# Patient Record
Sex: Female | Born: 1985
Health system: Southern US, Community
[De-identification: ages and names within clinical notes are randomized; demographics above are authoritative.]

## PROBLEM LIST (undated history)

## (undated) DIAGNOSIS — T8859XA Other complications of anesthesia, initial encounter: Secondary | ICD-10-CM

## (undated) DIAGNOSIS — T4145XA Adverse effect of unspecified anesthetic, initial encounter: Secondary | ICD-10-CM

## (undated) DIAGNOSIS — R112 Nausea with vomiting, unspecified: Secondary | ICD-10-CM

## (undated) DIAGNOSIS — F419 Anxiety disorder, unspecified: Secondary | ICD-10-CM

## (undated) DIAGNOSIS — R1084 Generalized abdominal pain: Secondary | ICD-10-CM

## (undated) DIAGNOSIS — R05 Cough: Secondary | ICD-10-CM

## (undated) DIAGNOSIS — M5481 Occipital neuralgia: Secondary | ICD-10-CM

## (undated) DIAGNOSIS — E282 Polycystic ovarian syndrome: Secondary | ICD-10-CM

## (undated) DIAGNOSIS — Z9889 Other specified postprocedural states: Secondary | ICD-10-CM

## (undated) DIAGNOSIS — I10 Essential (primary) hypertension: Secondary | ICD-10-CM

## (undated) DIAGNOSIS — K625 Hemorrhage of anus and rectum: Secondary | ICD-10-CM

## (undated) DIAGNOSIS — N200 Calculus of kidney: Secondary | ICD-10-CM

## (undated) DIAGNOSIS — K760 Fatty (change of) liver, not elsewhere classified: Secondary | ICD-10-CM

## (undated) DIAGNOSIS — R198 Other specified symptoms and signs involving the digestive system and abdomen: Secondary | ICD-10-CM

## (undated) HISTORY — DX: Hemorrhage of anus and rectum: K62.5

## (undated) HISTORY — PX: ESOPHAGOGASTRODUODENOSCOPY ENDOSCOPY: SHX5814

## (undated) HISTORY — PX: EYE SURGERY: SHX253

## (undated) HISTORY — DX: Calculus of kidney: N20.0

## (undated) HISTORY — DX: Fatty (change of) liver, not elsewhere classified: K76.0

## (undated) HISTORY — DX: Generalized abdominal pain: R10.84

## (undated) HISTORY — DX: Cough: R05

## (undated) HISTORY — PX: LASIK: SHX215

## (undated) HISTORY — DX: Other specified symptoms and signs involving the digestive system and abdomen: R19.8

## (undated) HISTORY — PX: CHOLECYSTECTOMY: SHX55

---
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
## 2004-05-10 ENCOUNTER — Other Ambulatory Visit: Admission: RE | Admit: 2004-05-10 | Discharge: 2004-05-10 | Payer: Self-pay | Admitting: Gynecology

## 2009-06-14 HISTORY — PX: LAPAROSCOPIC CHOLECYSTECTOMY: SUR755

## 2009-07-18 ENCOUNTER — Emergency Department (HOSPITAL_COMMUNITY): Admission: EM | Admit: 2009-07-18 | Discharge: 2009-07-18 | Payer: Self-pay | Admitting: Emergency Medicine

## 2009-07-19 ENCOUNTER — Observation Stay (HOSPITAL_COMMUNITY): Admission: EM | Admit: 2009-07-19 | Discharge: 2009-07-20 | Payer: Self-pay | Admitting: Emergency Medicine

## 2010-06-14 ENCOUNTER — Emergency Department: Payer: Self-pay | Admitting: Emergency Medicine

## 2010-11-14 HISTORY — PX: OTHER SURGICAL HISTORY: SHX169

## 2011-01-10 ENCOUNTER — Other Ambulatory Visit: Payer: Self-pay | Admitting: Allergy

## 2011-01-10 ENCOUNTER — Ambulatory Visit
Admission: RE | Admit: 2011-01-10 | Discharge: 2011-01-10 | Disposition: A | Discharge status: Home / Self Care | Payer: BC Managed Care – PPO | Source: Ambulatory Visit | Attending: Allergy | Admitting: Allergy

## 2011-02-18 LAB — DIFFERENTIAL
Basophils Absolute: 0 10*3/uL (ref 0.0–0.1)
Basophils Absolute: 0 10*3/uL (ref 0.0–0.1)
Basophils Relative: 0 % (ref 0–1)
Eosinophils Absolute: 0.2 10*3/uL (ref 0.0–0.7)
Eosinophils Absolute: 0.2 10*3/uL (ref 0.0–0.7)
Eosinophils Relative: 1 % (ref 0–5)
Eosinophils Relative: 3 % (ref 0–5)
Lymphocytes Relative: 24 % (ref 12–46)
Neutrophils Relative %: 80 % — ABNORMAL HIGH (ref 43–77)

## 2011-02-18 LAB — COMPREHENSIVE METABOLIC PANEL
ALT: 33 U/L (ref 0–35)
ALT: 35 U/L (ref 0–35)
AST: 21 U/L (ref 0–37)
AST: 27 U/L (ref 0–37)
Albumin: 3.5 g/dL (ref 3.5–5.2)
BUN: 8 mg/dL (ref 6–23)
CO2: 24 mEq/L (ref 19–32)
Calcium: 8.3 mg/dL — ABNORMAL LOW (ref 8.4–10.5)
Calcium: 8.9 mg/dL (ref 8.4–10.5)
Chloride: 104 mEq/L (ref 96–112)
Chloride: 105 mEq/L (ref 96–112)
Creatinine, Ser: 0.61 mg/dL (ref 0.4–1.2)
Creatinine, Ser: 0.72 mg/dL (ref 0.4–1.2)
GFR calc Af Amer: >60 mL/min (ref >60)
GFR calc Af Amer: >60 mL/min (ref >60)
GFR calc non Af Amer: >60 mL/min (ref >60)
Glucose, Bld: 115 mg/dL — ABNORMAL HIGH (ref 70–99)
Sodium: 142 mEq/L (ref 135–145)
Total Bilirubin: 0.5 mg/dL (ref 0.3–1.2)
Total Bilirubin: 0.9 mg/dL (ref 0.3–1.2)
Total Protein: 6.4 g/dL (ref 6.0–8.3)
Total Protein: 7.4 g/dL (ref 6.0–8.3)

## 2011-02-18 LAB — URINALYSIS, ROUTINE W REFLEX MICROSCOPIC
Ketones, ur: NEGATIVE mg/dL
Nitrite: NEGATIVE
Protein, ur: NEGATIVE mg/dL

## 2011-02-18 LAB — CBC
HCT: 37.6 % (ref 36.0–46.0)
MCHC: 33.5 g/dL (ref 30.0–36.0)
MCV: 79.7 fL (ref 78.0–100.0)
MCV: 79.8 fL (ref 78.0–100.0)
Platelets: 368 10*3/uL (ref 150–400)
RBC: 4.14 MIL/uL (ref 3.87–5.11)
RDW: 15.2 % (ref 11.5–15.5)
RDW: 15.3 % (ref 11.5–15.5)
WBC: 7.4 10*3/uL (ref 4.0–10.5)

## 2011-02-18 LAB — LIPASE, BLOOD: Lipase: 23 U/L (ref 11–59)

## 2011-02-18 LAB — POCT I-STAT, CHEM 8
HCT: 40 % (ref 36.0–46.0)
Hemoglobin: 13.6 g/dL (ref 12.0–15.0)
Sodium: 140 mEq/L (ref 135–145)
TCO2: 23 mmol/L (ref 0–100)

## 2011-02-18 LAB — AMYLASE: Amylase: 90 U/L (ref 27–131)

## 2011-02-18 LAB — POCT PREGNANCY, URINE: Preg Test, Ur: NEGATIVE

## 2011-11-15 HISTORY — PX: DILATION AND CURETTAGE OF UTERUS: SHX78

## 2012-01-14 LAB — URINALYSIS, COMPLETE
Bilirubin,UR: NEGATIVE
Blood: NEGATIVE
Glucose,UR: NEGATIVE mg/dL (ref 0–75)
Ph: 5 (ref 4.5–8.0)
Specific Gravity: 1.02 (ref 1.003–1.030)
Squamous Epithelial: 1

## 2012-01-14 LAB — CBC
HGB: 8.1 g/dL — ABNORMAL LOW (ref 12.0–16.0)
MCHC: 30.8 g/dL — ABNORMAL LOW (ref 32.0–36.0)
RDW: 19.5 % — ABNORMAL HIGH (ref 11.5–14.5)
WBC: 10.5 10*3/uL (ref 3.6–11.0)

## 2012-01-14 LAB — BASIC METABOLIC PANEL
BUN: 9 mg/dL (ref 7–18)
Chloride: 109 mmol/L — ABNORMAL HIGH (ref 98–107)
Co2: 21 mmol/L (ref 21–32)
Creatinine: 0.67 mg/dL (ref 0.60–1.30)
Potassium: 3.3 mmol/L — ABNORMAL LOW (ref 3.5–5.1)
Sodium: 147 mmol/L — ABNORMAL HIGH (ref 136–145)

## 2012-01-14 LAB — TROPONIN I: Troponin-I: <0.02 ng/mL

## 2012-01-15 ENCOUNTER — Observation Stay: Payer: Self-pay | Admitting: Internal Medicine

## 2012-01-15 LAB — TROPONIN I
Troponin-I: <0.02 ng/mL
Troponin-I: <0.02 ng/mL

## 2012-01-15 LAB — IRON AND TIBC
Iron Bind.Cap.(Total): 518 ug/dL — ABNORMAL HIGH (ref 250–450)
Iron: 13 ug/dL — ABNORMAL LOW (ref 50–170)
Unbound Iron-Bind.Cap.: 505 ug/dL

## 2012-01-15 LAB — FERRITIN: Ferritin (ARMC): 2 ng/mL — ABNORMAL LOW (ref 8–388)

## 2012-01-15 LAB — HCG, QUANTITATIVE, PREGNANCY: Beta Hcg, Quant.: 1560 m[IU]/mL — ABNORMAL HIGH

## 2012-01-15 LAB — RETICULOCYTES: Reticulocyte: 2.9 % — ABNORMAL HIGH (ref 0.5–1.5)

## 2012-01-16 LAB — MAGNESIUM: Magnesium: 1.6 mg/dL — ABNORMAL LOW

## 2012-01-16 LAB — CBC WITH DIFFERENTIAL/PLATELET
Basophil #: 0.1 10*3/uL (ref 0.0–0.1)
Basophil %: 0.8 %
Eosinophil #: 0.3 10*3/uL (ref 0.0–0.7)
Lymphocyte %: 27.8 %
MCHC: 30.8 g/dL — ABNORMAL LOW (ref 32.0–36.0)
MCV: 62 fL — ABNORMAL LOW (ref 80–100)
Neutrophil %: 59.4 %
Platelet: 385 10*3/uL (ref 150–440)
RBC: 3.84 10*6/uL (ref 3.80–5.20)
RDW: 18.9 % — ABNORMAL HIGH (ref 11.5–14.5)
WBC: 7.8 10*3/uL (ref 3.6–11.0)

## 2012-01-16 LAB — COMPREHENSIVE METABOLIC PANEL
Albumin: 3.2 g/dL — ABNORMAL LOW (ref 3.4–5.0)
Alkaline Phosphatase: 68 U/L (ref 50–136)
Anion Gap: 14 (ref 7–16)
Bilirubin,Total: 0.3 mg/dL (ref 0.2–1.0)
Co2: 22 mmol/L (ref 21–32)
Creatinine: 0.6 mg/dL (ref 0.60–1.30)
EGFR (Non-African Amer.): >60
Glucose: 83 mg/dL (ref 65–99)
Osmolality: 281 (ref 275–301)
SGPT (ALT): 24 U/L
Sodium: 143 mmol/L (ref 136–145)

## 2012-01-16 LAB — URINE CULTURE

## 2012-01-16 LAB — HCG, QUANTITATIVE, PREGNANCY: Beta Hcg, Quant.: 2668 m[IU]/mL — ABNORMAL HIGH

## 2012-01-16 LAB — HEMOGLOBIN A1C: Hemoglobin A1C: 5.6 % (ref 4.2–6.3)

## 2012-02-24 ENCOUNTER — Ambulatory Visit: Payer: Self-pay | Admitting: Obstetrics and Gynecology

## 2012-02-24 LAB — CBC
HCT: 33.8 % — ABNORMAL LOW (ref 35.0–47.0)
MCH: 21.8 pg — ABNORMAL LOW (ref 26.0–34.0)
MCHC: 31 g/dL — ABNORMAL LOW (ref 32.0–36.0)
Platelet: 314 10*3/uL (ref 150–440)
RBC: 4.8 10*6/uL (ref 3.80–5.20)
WBC: 6.7 10*3/uL (ref 3.6–11.0)

## 2012-02-28 LAB — PATHOLOGY REPORT

## 2012-11-14 HISTORY — PX: STENT PLACE LEFT URETER (ARMC HX): HXRAD1254

## 2013-11-11 ENCOUNTER — Ambulatory Visit: Payer: Self-pay | Admitting: Neurology

## 2013-11-13 ENCOUNTER — Emergency Department: Payer: Self-pay | Admitting: Emergency Medicine

## 2013-11-13 LAB — CBC WITH DIFFERENTIAL/PLATELET
Basophil #: 0.1 10*3/uL (ref 0.0–0.1)
Eosinophil #: 0.2 10*3/uL (ref 0.0–0.7)
Eosinophil %: 1.4 %
Lymphocyte %: 28.2 %
MCH: 27.3 pg (ref 26.0–34.0)
MCV: 81 fL (ref 80–100)
Monocyte #: 0.8 x10 3/mm (ref 0.2–0.9)
Monocyte %: 7.5 %
RBC: 4.53 10*6/uL (ref 3.80–5.20)
RDW: 13.5 % (ref 11.5–14.5)
WBC: 11.1 10*3/uL — ABNORMAL HIGH (ref 3.6–11.0)

## 2013-11-13 LAB — COMPREHENSIVE METABOLIC PANEL
Albumin: 3.7 g/dL (ref 3.4–5.0)
Anion Gap: 6 — ABNORMAL LOW (ref 7–16)
BUN: 9 mg/dL (ref 7–18)
Bilirubin,Total: 0.4 mg/dL (ref 0.2–1.0)
Calcium, Total: 9 mg/dL (ref 8.5–10.1)
Co2: 26 mmol/L (ref 21–32)
Glucose: 85 mg/dL (ref 65–99)
SGPT (ALT): 22 U/L (ref 12–78)
Sodium: 134 mmol/L — ABNORMAL LOW (ref 136–145)
Total Protein: 8.5 g/dL — ABNORMAL HIGH (ref 6.4–8.2)

## 2013-11-13 LAB — URINALYSIS, COMPLETE
Ketone: NEGATIVE
Leukocyte Esterase: NEGATIVE
Protein: NEGATIVE
RBC,UR: 110 /HPF (ref 0–5)
Specific Gravity: 1.017 (ref 1.003–1.030)
WBC UR: 3 /HPF (ref 0–5)

## 2013-11-13 LAB — LIPASE, BLOOD: Lipase: 150 U/L (ref 73–393)

## 2013-11-14 HISTORY — PX: HYSTEROSCOPY: SHX211

## 2014-05-07 ENCOUNTER — Emergency Department: Payer: Self-pay | Admitting: Emergency Medicine

## 2014-05-07 LAB — COMPREHENSIVE METABOLIC PANEL
ALBUMIN: 3.6 g/dL (ref 3.4–5.0)
Alkaline Phosphatase: 69 U/L
Anion Gap: 7 (ref 7–16)
BUN: 10 mg/dL (ref 7–18)
Bilirubin,Total: 0.2 mg/dL (ref 0.2–1.0)
Calcium, Total: 9.2 mg/dL (ref 8.5–10.1)
Chloride: 105 mmol/L (ref 98–107)
Co2: 25 mmol/L (ref 21–32)
Creatinine: 0.77 mg/dL (ref 0.60–1.30)
EGFR (African American): >60
EGFR (Non-African Amer.): >60
Glucose: 98 mg/dL (ref 65–99)
Osmolality: 273 (ref 275–301)
Potassium: 4 mmol/L (ref 3.5–5.1)
SGOT(AST): 43 U/L — ABNORMAL HIGH (ref 15–37)
SGPT (ALT): 34 U/L (ref 12–78)
Sodium: 137 mmol/L (ref 136–145)
TOTAL PROTEIN: 8.5 g/dL — AB (ref 6.4–8.2)

## 2014-05-07 LAB — CBC
HCT: 39.8 % (ref 35.0–47.0)
HGB: 13.6 g/dL (ref 12.0–16.0)
MCH: 28.7 pg (ref 26.0–34.0)
MCHC: 34.2 g/dL (ref 32.0–36.0)
MCV: 84 fL (ref 80–100)
Platelet: 368 10*3/uL (ref 150–440)
RBC: 4.75 10*6/uL (ref 3.80–5.20)
RDW: 13.7 % (ref 11.5–14.5)
WBC: 10.4 10*3/uL (ref 3.6–11.0)

## 2014-05-08 LAB — URINALYSIS, COMPLETE
BILIRUBIN, UR: NEGATIVE
Glucose,UR: NEGATIVE mg/dL (ref 0–75)
KETONE: NEGATIVE
Leukocyte Esterase: NEGATIVE
NITRITE: NEGATIVE
Ph: 5 (ref 4.5–8.0)
Protein: NEGATIVE
Specific Gravity: 1.019 (ref 1.003–1.030)
Squamous Epithelial: <1

## 2014-06-29 ENCOUNTER — Emergency Department: Payer: Self-pay | Admitting: Emergency Medicine

## 2014-06-29 LAB — BASIC METABOLIC PANEL
ANION GAP: 11 (ref 7–16)
BUN: 9 mg/dL (ref 7–18)
CALCIUM: 8.9 mg/dL (ref 8.5–10.1)
Chloride: 107 mmol/L (ref 98–107)
Co2: 22 mmol/L (ref 21–32)
Creatinine: 0.72 mg/dL (ref 0.60–1.30)
EGFR (Non-African Amer.): >60
Glucose: 118 mg/dL — ABNORMAL HIGH (ref 65–99)
Osmolality: 279 (ref 275–301)
Potassium: 3.9 mmol/L (ref 3.5–5.1)
SODIUM: 140 mmol/L (ref 136–145)

## 2014-06-29 LAB — CBC WITH DIFFERENTIAL/PLATELET
BASOS ABS: 0.1 10*3/uL (ref 0.0–0.1)
Basophil %: 0.7 %
EOS ABS: 0.1 10*3/uL (ref 0.0–0.7)
EOS PCT: 1.7 %
HCT: 38.1 % (ref 35.0–47.0)
HGB: 12.4 g/dL (ref 12.0–16.0)
Lymphocyte #: 2.7 10*3/uL (ref 1.0–3.6)
Lymphocyte %: 32.8 %
MCH: 27.6 pg (ref 26.0–34.0)
MCHC: 32.6 g/dL (ref 32.0–36.0)
MCV: 85 fL (ref 80–100)
Monocyte #: 0.5 x10 3/mm (ref 0.2–0.9)
Monocyte %: 6.5 %
Neutrophil #: 4.8 10*3/uL (ref 1.4–6.5)
Neutrophil %: 58.3 %
Platelet: 324 10*3/uL (ref 150–440)
RBC: 4.49 10*6/uL (ref 3.80–5.20)
RDW: 13.5 % (ref 11.5–14.5)
WBC: 8.3 10*3/uL (ref 3.6–11.0)

## 2014-06-29 LAB — URINALYSIS, COMPLETE
BILIRUBIN, UR: NEGATIVE
Glucose,UR: NEGATIVE mg/dL (ref 0–75)
KETONE: NEGATIVE
Nitrite: NEGATIVE
PROTEIN: NEGATIVE
Ph: 5 (ref 4.5–8.0)
SPECIFIC GRAVITY: 1.02 (ref 1.003–1.030)
Squamous Epithelial: 2
WBC UR: 143 /HPF (ref 0–5)

## 2014-06-29 LAB — PREGNANCY, URINE: PREGNANCY TEST, URINE: NEGATIVE m[IU]/mL

## 2014-07-02 LAB — URINE CULTURE

## 2014-09-09 ENCOUNTER — Ambulatory Visit: Payer: Self-pay | Admitting: Obstetrics and Gynecology

## 2014-09-09 LAB — BASIC METABOLIC PANEL
ANION GAP: 9 (ref 7–16)
BUN: 15 mg/dL (ref 7–18)
CHLORIDE: 104 mmol/L (ref 98–107)
CREATININE: 0.74 mg/dL (ref 0.60–1.30)
Calcium, Total: 8.9 mg/dL (ref 8.5–10.1)
Co2: 27 mmol/L (ref 21–32)
Glucose: 91 mg/dL (ref 65–99)
Osmolality: 280 (ref 275–301)
Potassium: 3.8 mmol/L (ref 3.5–5.1)
SODIUM: 140 mmol/L (ref 136–145)

## 2014-09-09 LAB — CBC
HCT: 38.5 % (ref 35.0–47.0)
HGB: 12.4 g/dL (ref 12.0–16.0)
MCH: 27.1 pg (ref 26.0–34.0)
MCHC: 32.4 g/dL (ref 32.0–36.0)
MCV: 84 fL (ref 80–100)
PLATELETS: 345 10*3/uL (ref 150–440)
RBC: 4.6 10*6/uL (ref 3.80–5.20)
RDW: 13.1 % (ref 11.5–14.5)
WBC: 13.6 10*3/uL — ABNORMAL HIGH (ref 3.6–11.0)

## 2014-09-14 HISTORY — PX: COLONOSCOPY: SHX174

## 2014-09-16 ENCOUNTER — Ambulatory Visit: Payer: Self-pay | Admitting: Obstetrics and Gynecology

## 2014-11-11 DIAGNOSIS — K625 Hemorrhage of anus and rectum: Secondary | ICD-10-CM

## 2014-11-11 DIAGNOSIS — R198 Other specified symptoms and signs involving the digestive system and abdomen: Secondary | ICD-10-CM

## 2014-11-11 DIAGNOSIS — G8929 Other chronic pain: Secondary | ICD-10-CM | POA: Insufficient documentation

## 2014-11-11 DIAGNOSIS — R194 Change in bowel habit: Secondary | ICD-10-CM | POA: Insufficient documentation

## 2014-11-11 DIAGNOSIS — K760 Fatty (change of) liver, not elsewhere classified: Secondary | ICD-10-CM | POA: Insufficient documentation

## 2014-11-11 DIAGNOSIS — R1084 Generalized abdominal pain: Secondary | ICD-10-CM

## 2014-11-11 HISTORY — DX: Fatty (change of) liver, not elsewhere classified: K76.0

## 2014-11-11 HISTORY — DX: Hemorrhage of anus and rectum: K62.5

## 2014-11-11 HISTORY — DX: Generalized abdominal pain: R10.84

## 2014-11-11 HISTORY — DX: Other specified symptoms and signs involving the digestive system and abdomen: R19.8

## 2015-03-07 NOTE — Op Note (Signed)
PATIENT NAME:  Melissa Martinez, [name] MR#:  [id number] DATE OF BIRTH:  1986/09/22  DATE OF PROCEDURE:  09/16/2014  PREOPERATIVE DIAGNOSIS: Abnormal endometrial findings on transvaginal ultrasound for pelvic pain.   POSTOPERATIVE DIAGNOSIS: Endometrial polyp.   OPERATION PERFORMED: Hysteroscopy, dilatation and curettage.   ANESTHESIA USED: General.   PRIMARY SURGEON: Florina OuAndreas M. Bonney AidStaebler, M.D.   ESTIMATED BLOOD LOSS: Minimal.   OPERATIVE FLUIDS: 500 mL of crystalloid.   URINE OUTPUT: 100 mL of clear urine.   DRAINS OR TUBES: None.   IMPLANTS: None.   PREOPERATIVE ANTIBIOTICS: None.   COMPLICATIONS: None.   FINDINGS: Large endometrial polyp arising from the posterior right wall of the uterus. Otherwise normal findings.   SPECIMENS REMOVED: Endometrial curetting and ndometrial polyp.   PATIENT CONDITION FOLLOWING THE PROCEDURE: Stable.   PROCEDURE IN DETAIL: Risks, benefits, and alternatives of the procedure were discussed with the patient prior to proceeding to the operating room. The patient was taken to the operating room where she was placed under general endotracheal anesthesia. She was positioned in the dorsal lithotomy position, prepped, and draped in the usual sterile fashion. A timeout was performed.   Attention was turned to the patient's pelvis. The bladder was straight catheterized with a red rubber catheter yielding 100 mL of clear urine. An operative speculum was placed. The anterior lip of the cervix was visualized, grasped with a single-tooth tenaculum. The cervix was sequentially dilated using Pratt dilators. Hysteroscope was advanced noticing a polyp arising from the right posterior fundal portion of the endometrial cavity. This was removed with sharp curettage and a combination of polyp forceps. Following this, a repeat hysteroscopy revealed complete removal of the polyp with an otherwise normal endometrial cavity. The hysteroscope was removed, as was the single-tooth  tenaculum. The cervix was hemostatic.   Sponge and instrument counts were correct x 2.   The patient tolerated the procedure well and was taken to the recovery room in stable condition.    ____________________________ Florina OuAndreas M. Bonney AidStaebler, MD ams:JT D: 09/16/2014 20:24:14 ET T: 09/17/2014 12:00:37 ET JOB#: [id number]  cc: Florina OuAndreas M. Bonney AidStaebler, MD, <Dictator> Carmel SacramentoANDREAS Cathrine MusterM Aryaan Persichetti MD ELECTRONICALLY SIGNED 10/02/2014 8:47

## 2015-03-08 NOTE — Discharge Summary (Signed)
PATIENT NAME:  Melissa Martinez, [name] MR#:  [id number] DATE OF BIRTH:  10/04/1986  DATE OF ADMISSION:  01/15/2012 DATE OF DISCHARGE:  01/16/2012  ADMITTING PHYSICIAN: Dr. Krystal EatonShayiq Ahmadzia  DISCHARGING PHYSICIAN: Dr. Enid Baasadhika Merik Mignano  PRIMARY CARE PHYSICIAN: None.   CONSULTATION IN THE HOSPITAL: OB/GYN consultation by Dr. Adria Devonarrie Klett.   DISCHARGE DIAGNOSES:  1. Symptomatic anemia.  2. Iron deficiency likely chronic anemia.  3. Five week gestational pregnancy.  4. Polycystic ovarian disease.  5. Urinary tract infection.   DISCHARGE MEDICATIONS:  1. Metformin 1000 mg p.o. b.i.d.  2. Vitamin C supplement 1 tablet p.o. daily.  3. Vitamin D3 supplements 1 tablet p.o. daily.  4. Prenatal plus iron tablet 1 tablet p.o. daily.  5. Ferrous sulfate 325 mg p.o. b.i.d. with meals.  6. Folic acid 3 mg p.o. daily.  7. Vitamin B12, 5000 units sublingual daily.  8. Macrobid 100 mg p.o. b.i.d. for four days.   DISCHARGE DIET: Regular diet.   DISCHARGE ACTIVITY: As tolerated.    FOLLOW UP INSTRUCTIONS: OB-GYN follow up in one week. Appointment scheduled with Dr. Janene HarveyKlett on 03/12 at 11:30 a.m.   LABORATORY, DIAGNOSTIC AND RADIOLOGICAL DATA: WBC 7.8, hemoglobin 7.3, hematocrit 23.8, platelet count 385, MCV 62, MCH 19.1, MCHC 30.8.   Serum iron 13, iron-binding capacity elevated at 518, ferritin is low at 2, iron saturation is only  3%, retic count 2.9, absolute reticulocyte count 0.118, haptoglobin normal at 153. beta-hCG serum elevated at 2668. Serum progesterone 11.5.   Sodium 143, potassium 3.5, chloride 107, bicarbonate 22, BUN 5, creatinine 0.6, glucose 83, calcium 8.6. ALT 24, AST 25, alkaline phosphatase 68, total bilirubin 0.3, albumin 3.2, serum magnesium 1.6. Hemoglobin A1c 5.6. Stool for occult blood is negative. Troponin enzymes have been negative. D-dimer 0.23. Urine pregnancy test positive. Urinalysis showed 1+ leukocyte esterase with few WBCs and 2+ bacteria. Culture is growing mixed  bacterial organisms.   Ultrasound OB less than 14 weeks showing presumed early gestational sac measuring less than five weeks gestation. No yolk sac or fetal pole is demonstrated currently. Simple appearing left ovarian cyst is present. No another abnormalities in adnexal region. Mild hydronephrosis on the left side. CT of the chest for PE with contrast shows no evidence of any acute PE. No mediastinal or hilar lymphadenopathy. No discrete alveolar infiltrate. Minimal increased interstitial density in both lungs could be nonspecific versus pneumonitis or edema. There is no splenomegaly. Mild fullness of visualized portions of left renal collecting system.   BRIEF HOSPITAL COURSE: [name] is a 29 year old young Caucasian female who has history of polycystic ovarian disease on oral contraceptive pills in the past who has been married for six months now presents to the hospital complaining of chest pain and also tachycardia. She was found to be in sinus tachycardia, heart rate of 125 without any EKG changes. She had troponins done which were negative. CT chest negative for PE. On blood work she was found to be anemic at 7.3 and also her urine pregnancy test was positive and pelvic ultrasound showing her to have less than five weeks gestation.   1. Acute on chronic anemia with recent hemorrhoidal bleed, hemoglobin 7.3. No baseline known but because she was symptomatic with tachycardia, chest pain she was given 1 unit of packed RBC transfusion in the hospital. Symptoms have improved, tachycardia resolved and was able to ambulate well. She is also being discharged on vitamin B12 and also iron supplements and will follow up with OB/GYN. Stool for occult  blood is negative.   2. Polycystic ovarian disease. She was on metformin 1000 mg p.o. b.i.d. and per Dr. Lemar Lofty note she can continue that until second trimester. 3. Urinary tract infection. Culture is negative. Urinalysis positive. Continue Macrobid for  another four more days. She received one day of antibiotics in the hospital.  4. Her course has been otherwise uneventful in the hospital.   DISCHARGE CONDITION: Stable.   DISCHARGE DISPOSITION: Home.   TIME SPENT ON DISCHARGE: 40 minutes.   ____________________________ Enid Baas, MD rk:cms D: 01/17/2012 16:50:46 ET T: 01/18/2012 10:29:50 ET JOB#: [id number]  cc: Enid Baas, MD, <Dictator> Elliot Gurney, MD Enid Baas MD ELECTRONICALLY SIGNED 01/24/2012 13:57

## 2015-03-08 NOTE — Consult Note (Signed)
Brief Consult Note: Diagnosis: anemia early pregnancy tachycardia.   Patient was seen by consultant.   Consult note dictated.   Recommend further assessment or treatment.   Discussed with Attending MD.   Comments: would recommend adding B12 sublingual to her diet, 5000 units methyl B12, also start 3 mg of folic acid a day, start PNV a day, take Hemax or equivalent for the pregnancy, hemoglobin electrophoresis to check for unusual variant given mcv IS SO LOW, reck quant beta hcg today, and progesterone level if going up would recommend US in 10 days. MAy do well with 2 units of blood as this may have been an acute loss via hemorhrioids with pain and blood in the commode. HCt has dropped again to 23.8 but now hydrated so most likely dilutional, magnesium is low and she can take this po, continue metfromin unit ntil second trimester, thank you!.  Electronic Signatures: Adria DevonKlett, Zollie Ellery (MD)  (Signed 04-Mar-13 10:55)  Authored: Brief Consult Note   Last Updated: 04-Mar-13 10:55 by Adria DevonKlett, Cohen Doleman (MD)

## 2015-03-08 NOTE — H&P (Signed)
PATIENT NAME:  Melissa Martinez, Quincy R MR#:  [id number] DATE OF BIRTH:  08-Jan-1986  DATE OF ADMISSION:  01/14/2012  REFERRING PHYSICIAN: Dr. Lorenso CourierPowers.   CHIEF COMPLAINT: Tachycardia, chest pain.   HISTORY OF PRESENT ILLNESS: The patient is a 29 year old Caucasian female with polycystic ovary disease who presents after experiencing tachycardia while ambulating as well as some chest pain that is in the left upper region of the chest radiating to the arm and jaw last night about 7:30. The patient stated that symptoms initially started the night before but resolved spontaneously. Yesterday the heart rate had gone up to 150's at that time and the patient was concerned and presented to the ED. The patient denied having any fevers, chills, nausea, vomiting, shortness of breath, blurry vision, or headaches. On initial EKG she was found to be in sinus tach to 125. The patient, furthermore, was found to have significant anemia with hemoglobin of 8.1 and MCV being pretty low at 62. Furthermore, the patient was found to have positive pregnancy test and ultrasound of the abdomen was done showing a less than [redacted] week gestation. OB was consulted in addition to [facility] overnight. The patient does not have any further chest pains. The thought was to go ahead and obtain a CT PE protocol to rule out a PE and if negative likely could be discharged and have outpatient work-up. The CAT scan came back negative, however, the patient still has mild tachycardia especially on sitting up or ambulating to one teens to 120. The patient has had some IV fluid already. The patient has no further chest pain. I discussed the case this morning with OB/GYN doctor, Dr. Greggory KeeneFrancesco, and will admit the patient for observation.   PAST MEDICAL HISTORY: Polycystic ovary syndrome.   MEDICATIONS: None.   PAST SURGICAL HISTORY: Cholecystectomy.   ALLERGIES: Bactrim.   SOCIAL HISTORY: Denies tobacco. Social drinker. Had several drinks prior weekend,  however, at that time did not know she was pregnant. Denies any drug use. Is married. This is her first child.   FAMILY HISTORY: Mom with high blood pressure. Brother at age 30 with high blood pressure.   REVIEW OF SYSTEMS: CONSTITUTIONAL: Denies fevers, fatigue, weakness, or weight gain. The patient did note 10 pound weight loss secondary to eating better and exercising. EYES: No blurry vision. ENT: No tinnitus or hearing loss. RESPIRATORY: No cough, wheezing, hemoptysis, dyspnea, or asthma. No painful respirations. CARDIOVASCULAR: Chest pain as above, resolved now. No orthopnea or edema. No dyspnea on exertion. Tachycardia as above. No syncope or palpitations. GI: Denies nausea, vomiting, diarrhea, or abdominal pain. The patient had several episodes of bright red blood per rectum last week about a teaspoon full. No gastroesophageal reflux disease. Denies hemorrhoids or hernia. GU: No dysuria, frequency, or incontinence. ENDOCRINE: No polyuria, nocturia, or thyroid problems. HEME/LYMPH: Does have history of iron deficiency anemia multiple years ago, resolved per patient. SKIN: No rashes. MUSCULOSKELETAL: Denies any arthritis. NEUROLOGIC: No numbness, weakness, ataxia, or dementia. PSYCH: No anxiety or insomnia.   PHYSICAL EXAMINATION:   VITAL SIGNS: On arrival, pulse 112, respiratory rate 20, blood pressure 136/62, oxygen sat 98% on room air. Currently pressures are 131/59. Last pulse was 106.   GENERAL: Mildly obese Caucasian female laying in bed in no obvious distress talking in full sentences.   HEENT: Normocephalic, atraumatic. Pupils are equal and reactive. Anicteric sclerae. Moist mucous membranes.   NECK: No JVD. Supple.   CARDIOVASCULAR: S1, S2, slightly tachycardic. No murmurs, rubs, or gallops.  LUNGS: Clear to auscultation without wheezing or rhonchi.   ABDOMEN: Soft, nontender, nondistended. Positive bowel sounds in all quadrants.   EXTREMITIES: No significant edema.    NEUROLOGIC: Cranial nerves II through XII grossly intact. Strength 5 out of 5 in all extremities.   LABORATORY, DIAGNOSTIC, AND RADIOLOGICAL DATA: Serum glucose 116, creatinine 0.67, sodium 147, potassium 3.3, chloride 109, anion gap 17. Serum HCG quant 1560. Troponin negative x1. WBC 10.5, hemoglobin 8.1, hematocrit 26.2, platelets 487, MCV 62. D-dimer 0.23. Urinalysis negative nitrites, 1+ leukocyte esterase, 2+ bacteria, WBCs 19. Pregnancy test is positive.   CT of the chest PE protocol showing no acute PE. No discrete alveolar infiltrate. Minimally increased interstitial density in both lungs, nonspecific. No splenomegaly. Mild fullness in the visualized portion of the left renal collecting system.   Ultrasound of the abdomen showing presumed early gestational sac measuring less than [redacted] week gestation. No yolk sac or fetal pole. Simple appearing left ovarian cyst, otherwise, echo texture and vascularity being normal. Mild hydronephrosis on the left.   ASSESSMENT AND PLAN: We have a 29 year old Caucasian female with polycystic ovary disease on no medications who presents with tachycardia, chest pains, was found to have significant anemia and possible urinary tract infection.  1. For chest pain, this likely is in the setting of tachycardia and setting of anemia. While she had most intense pain she was pretty tachycardic at that time. She has a negative troponin and EKG which does not show any significant ST changes and the chest pain has now resolved. The patient has a CT PE protocol showing no significant infiltrate or evidence for PE. I discussed the case with OB/GYN as well as [facility] overnight. I doubt she is having acute coronary syndrome. She has no fever or leukocytosis to suggest she is having an infection. I would cycle troponins x2.  2. For her tachycardia, this likely in the setting of anemia. The patient has had a guaiac stool while in the ED which was negative. Although she did say that  she has had some bright red blood per rectum, it was not significant and MCV suggests anemia which is likely iron deficiency and more chronic in nature. Perhaps the patient does need evaluation by GI at one point but at this point per OB/GYN we would not transfuse, monitor, do iron studies, and supplement with iron. I would also check retic count. The patient does have urinary tract infection at least per urinalysis, however, she is asymptomatic but given she is pregnant we would treat with Macrobid per OB's suggestion for seven days. Would also consult OB and start the patient on prenatal vitamins. She is mildly hypokalemic and this would be repleted.   The case was discussed with the patient as well as Dr. Judithann Sheen, Dr. Margie Ege, and Dr. Greggory Keen. The patient is agreeable to this plan.   TOTAL TIME SPENT: 75 minutes.   CODE STATUS: The patient is FULL CODE.   ____________________________ Krystal Eaton, MD sa:drc D: 01/15/2012 08:45:17 ET T: 01/15/2012 09:15:09 ET JOB#: [id number]  cc: Krystal Eaton, MD, <Dictator> Krystal Eaton MD ELECTRONICALLY SIGNED 01/19/2012 15:36

## 2015-03-09 LAB — SURGICAL PATHOLOGY

## 2015-04-09 ENCOUNTER — Encounter: Payer: Self-pay | Admitting: Emergency Medicine

## 2015-04-09 ENCOUNTER — Other Ambulatory Visit: Payer: Self-pay

## 2015-04-09 ENCOUNTER — Emergency Department
Admission: EM | Admit: 2015-04-09 | Discharge: 2015-04-09 | Disposition: A | Discharge status: Home / Self Care | Payer: BLUE CROSS/BLUE SHIELD | Attending: Emergency Medicine | Admitting: Emergency Medicine

## 2015-04-09 ENCOUNTER — Emergency Department: Payer: BLUE CROSS/BLUE SHIELD

## 2015-04-09 DIAGNOSIS — Z3202 Encounter for pregnancy test, result negative: Secondary | ICD-10-CM | POA: Insufficient documentation

## 2015-04-09 DIAGNOSIS — R0602 Shortness of breath: Secondary | ICD-10-CM | POA: Diagnosis not present

## 2015-04-09 DIAGNOSIS — F439 Reaction to severe stress, unspecified: Secondary | ICD-10-CM | POA: Insufficient documentation

## 2015-04-09 DIAGNOSIS — R0789 Other chest pain: Secondary | ICD-10-CM | POA: Insufficient documentation

## 2015-04-09 DIAGNOSIS — F419 Anxiety disorder, unspecified: Secondary | ICD-10-CM | POA: Diagnosis not present

## 2015-04-09 DIAGNOSIS — R079 Chest pain, unspecified: Secondary | ICD-10-CM

## 2015-04-09 HISTORY — DX: Polycystic ovarian syndrome: E28.2

## 2015-04-09 LAB — BASIC METABOLIC PANEL
Anion gap: 9 (ref 5–15)
BUN: 10 mg/dL (ref 6–20)
CO2: 25 mmol/L (ref 22–32)
Calcium: 9.2 mg/dL (ref 8.9–10.3)
Chloride: 104 mmol/L (ref 101–111)
Creatinine, Ser: 0.64 mg/dL (ref 0.44–1.00)
GFR calc non Af Amer: >60 mL/min (ref >60)
GLUCOSE: 132 mg/dL — AB (ref 65–99)
Potassium: 3.4 mmol/L — ABNORMAL LOW (ref 3.5–5.1)
SODIUM: 138 mmol/L (ref 135–145)

## 2015-04-09 LAB — CBC
HCT: 36.7 % (ref 35.0–47.0)
HEMOGLOBIN: 12.5 g/dL (ref 12.0–16.0)
MCH: 27.8 pg (ref 26.0–34.0)
MCHC: 34 g/dL (ref 32.0–36.0)
MCV: 82 fL (ref 80.0–100.0)
Platelets: 312 10*3/uL (ref 150–440)
RBC: 4.48 MIL/uL (ref 3.80–5.20)
RDW: 12.9 % (ref 11.5–14.5)
WBC: 8.2 10*3/uL (ref 3.6–11.0)

## 2015-04-09 LAB — FIBRIN DERIVATIVES D-DIMER (ARMC ONLY): FIBRIN DERIVATIVES D-DIMER (ARMC): 172.3 (ref 0–499)

## 2015-04-09 LAB — TROPONIN I

## 2015-04-09 LAB — HCG, QUANTITATIVE, PREGNANCY: hCG, Beta Chain, Quant, S: 1 m[IU]/mL (ref <5)

## 2015-04-09 MED ORDER — DIAZEPAM 5 MG PO TABS
ORAL_TABLET | ORAL | Status: AC
  Filled 2015-04-09: qty 1

## 2015-04-09 MED ORDER — IBUPROFEN 800 MG PO TABS
ORAL_TABLET | ORAL | Status: AC
  Filled 2015-04-09: qty 1

## 2015-04-09 MED ORDER — DIAZEPAM 5 MG PO TABS
5.0000 mg | ORAL_TABLET | Freq: Once | ORAL | Status: AC
  Administered 2015-04-09: 5 mg via ORAL

## 2015-04-09 MED ORDER — DIAZEPAM 5 MG PO TABS: 5.0000 mg | ORAL_TABLET | Freq: Three times a day (TID) | ORAL | Status: DC | PRN

## 2015-04-09 NOTE — Discharge Instructions (Signed)
Chest Pain (Nonspecific) °It is often hard to give a specific diagnosis for the cause of chest pain. There is always a chance that your pain could be related to something serious, such as a heart attack or a blood clot in the lungs. You need to follow up with your health care provider for further evaluation. °CAUSES  °· Heartburn. °· Pneumonia or bronchitis. °· Anxiety or stress. °· Inflammation around your heart (pericarditis) or lung (pleuritis or pleurisy). °· A blood clot in the lung. °· A collapsed lung (pneumothorax). It can develop suddenly on its own (spontaneous pneumothorax) or from trauma to the chest. °· Shingles infection (herpes zoster virus). °The chest wall is composed of bones, muscles, and cartilage. Any of these can be the source of the pain. °· The bones can be bruised by injury. °· The muscles or cartilage can be strained by coughing or overwork. °· The cartilage can be affected by inflammation and become sore (costochondritis). °DIAGNOSIS  °Lab tests or other studies may be needed to find the cause of your pain. Your health care provider may have you take a test called an ambulatory electrocardiogram (ECG). An ECG records your heartbeat patterns over a 24-hour period. You may also have other tests, such as: °· Transthoracic echocardiogram (TTE). During echocardiography, sound waves are used to evaluate how blood flows through your heart. °· Transesophageal echocardiogram (TEE). °· Cardiac monitoring. This allows your health care provider to monitor your heart rate and rhythm in real time. °· Holter monitor. This is a portable device that records your heartbeat and can help diagnose heart arrhythmias. It allows your health care provider to track your heart activity for several days, if needed. °· Stress tests by exercise or by giving medicine that makes the heart beat faster. °TREATMENT  °· Treatment depends on what may be causing your chest pain. Treatment may include: °· Acid blockers for  heartburn. °· Anti-inflammatory medicine. °· Pain medicine for inflammatory conditions. °· Antibiotics if an infection is present. °· You may be advised to change lifestyle habits. This includes stopping smoking and avoiding alcohol, caffeine, and chocolate. °· You may be advised to keep your head raised (elevated) when sleeping. This reduces the chance of acid going backward from your stomach into your esophagus. °Most of the time, nonspecific chest pain will improve within 2-3 days with rest and mild pain medicine.  °HOME CARE INSTRUCTIONS  °· If antibiotics were prescribed, take them as directed. Finish them even if you start to feel better. °· For the next few days, avoid physical activities that bring on chest pain. Continue physical activities as directed. °· Do not use any tobacco products, including cigarettes, chewing tobacco, or electronic cigarettes. °· Avoid drinking alcohol. °· Only take medicine as directed by your health care provider. °· Follow your health care provider's suggestions for further testing if your chest pain does not go away. °· Keep any follow-up appointments you made. If you do not go to an appointment, you could develop lasting (chronic) problems with pain. If there is any problem keeping an appointment, call to reschedule. °SEEK MEDICAL CARE IF:  °· Your chest pain does not go away, even after treatment. °· You have a rash with blisters on your chest. °· You have a fever. °SEEK IMMEDIATE MEDICAL CARE IF:  °· You have increased chest pain or pain that spreads to your arm, neck, jaw, back, or abdomen. °· You have shortness of breath. °· You have an increasing cough, or you cough   up blood. °· You have severe back or abdominal pain. °· You feel nauseous or vomit. °· You have severe weakness. °· You faint. °· You have chills. °This is an emergency. Do not wait to see if the pain will go away. Get medical help at once. Call your local emergency services (911 in [place] Do not drive  yourself to the hospital. °MAKE SURE YOU:  °· Understand these instructions. °· Will watch your condition. °· Will get help right away if you are not doing well or get worse. °Document Released: 08/10/2005 Document Revised: 11/05/2013 Document Reviewed: 06/05/2008 °[employer]® Patient Information ©2015 [employer]. This information is not intended to replace advice given to you by your health care provider. Make sure you discuss any questions you have with your health care provider. ° °Panic Attacks °Panic attacks are sudden, short-lived surges of severe anxiety, fear, or discomfort. They may occur for no reason when you are relaxed, when you are anxious, or when you are sleeping. Panic attacks may occur for a number of reasons:  °· Healthy people occasionally have panic attacks in extreme, life-threatening situations, such as war or natural disasters. Normal anxiety is a protective mechanism of the body that helps us react to danger (fight or flight response). °· Panic attacks are often seen with anxiety disorders, such as panic disorder, social anxiety disorder, generalized anxiety disorder, and phobias. Anxiety disorders cause excessive or uncontrollable anxiety. They may interfere with your relationships or other life activities. °· Panic attacks are sometimes seen with other mental illnesses, such as depression and posttraumatic stress disorder. °· Certain medical conditions, prescription medicines, and drugs of abuse can cause panic attacks. °SYMPTOMS  °Panic attacks start suddenly, peak within 20 minutes, and are accompanied by four or more of the following symptoms: °· Pounding heart or fast heart rate (palpitations). °· Sweating. °· Trembling or shaking. °· Shortness of breath or feeling smothered. °· Feeling choked. °· Chest pain or discomfort. °· Nausea or strange feeling in your stomach. °· Dizziness, light-headedness, or feeling like you will faint. °· Chills or hot flushes. °· Numbness or tingling in  your lips or hands and feet. °· Feeling that things are not real or feeling that you are not yourself. °· Fear of losing control or going crazy. °· Fear of dying. °Some of these symptoms can mimic serious medical conditions. For example, you may think you are having a heart attack. Although panic attacks can be very scary, they are not life threatening. °DIAGNOSIS  °Panic attacks are diagnosed through an assessment by your health care provider. Your health care provider will ask questions about your symptoms, such as where and when they occurred. Your health care provider will also ask about your medical history and use of alcohol and drugs, including prescription medicines. Your health care provider may order blood tests or other studies to rule out a serious medical condition. Your health care provider may refer you to a mental health professional for further evaluation. °TREATMENT  °· Most healthy people who have one or two panic attacks in an extreme, life-threatening situation will not require treatment. °· The treatment for panic attacks associated with anxiety disorders or other mental illness typically involves counseling with a mental health professional, medicine, or a combination of both. Your health care provider will help determine what treatment is best for you. °· Panic attacks due to physical illness usually go away with treatment of the illness. If prescription medicine is causing panic attacks, talk with your health care   provider about stopping the medicine, decreasing the dose, or substituting another medicine. °· Panic attacks due to alcohol or drug abuse go away with abstinence. Some adults need professional help in order to stop drinking or using drugs. °HOME CARE INSTRUCTIONS  °· Take all medicines as directed by your health care provider.   °· Schedule and attend follow-up visits as directed by your health care provider. It is important to keep all your appointments. °SEEK MEDICAL CARE  IF: °· You are not able to take your medicines as prescribed. °· Your symptoms do not improve or get worse. °SEEK IMMEDIATE MEDICAL CARE IF:  °· You experience panic attack symptoms that are different than your usual symptoms. °· You have serious thoughts about hurting yourself or others. °· You are taking medicine for panic attacks and have a serious side effect. °MAKE SURE YOU: °· Understand these instructions. °· Will watch your condition. °· Will get help right away if you are not doing well or get worse. °Document Released: 10/31/2005 Document Revised: 11/05/2013 Document Reviewed: 06/14/2013 °Exit[employer]® Patient Information ©2015 [employer]. This information is not intended to replace advice given to you by your health care provider. Make sure you discuss any questions you have with your health care provider. ° °

## 2015-04-09 NOTE — ED Provider Notes (Signed)
[facility] Emergency Department Provider Note     Time seen: ----------------------------------------- 2:26 PM on 04/09/2015 -----------------------------------------    I have reviewed the triage vital signs and the nursing notes.   HISTORY  Chief Complaint Chest Pain    HPI Melissa Martinez is a 29 y.o. female who presents ER for chest pain shortness breath started about 2 days ago. Patient states worse when she lays down at night, is not while she is asleep but before she goes to sleep. She has some chest tightness raised on her left arm, feels like something sitting on her chest. She was sent here by PA as an outpatient and blood were taken. Denies any recent illness, she does intermittently take birth control for polycystic ovaries. Denies any recent travel or leg swelling.     Past Medical History  Diagnosis Date  . PCOS (polycystic ovarian syndrome)     There are no active problems to display for this patient.   Past Surgical History  Procedure Laterality Date  . Cholecystectomy      No current outpatient prescriptions on file.  Allergies Bactrim  History reviewed. No pertinent family history.  Social History History  Substance Use Topics  . Smoking status: Never Smoker   . Smokeless tobacco: Not on file  . Alcohol Use: No    Review of Systems Constitutional: Negative for fever. Eyes: Negative for visual changes. ENT: Negative for sore throat. Cardiovascular: Positive for chest pain Respiratory: Positive for shortness of breath Gastrointestinal: Negative for abdominal pain, vomiting and diarrhea. Genitourinary: Negative for dysuria. Musculoskeletal: Negative for back pain. Skin: Negative for rash. Neurological: Negative for headaches, focal weakness or numbness. Psychiatric: Positive for stress and some anxiety 10-point ROS otherwise negative.  ____________________________________________   PHYSICAL  EXAM:  VITAL SIGNS: ED Triage Vitals  Enc Vitals Group     BP --      Pulse --      Resp --      Temp --      Temp src --      SpO2 --      Weight --      Height --      Head Cir --      Peak Flow --      Pain Score 04/09/15 1344 6     Pain Loc --      Pain Edu? --      Excl. in GC? --     Constitutional: Alert and oriented. Mildly anxious Eyes: Conjunctivae are normal. PERRL. Normal extraocular movements. ENT   Head: Normocephalic and atraumatic.   Nose: No congestion/rhinnorhea.   Mouth/Throat: Mucous membranes are moist.   Neck: No stridor. Hematological/Lymphatic/Immunilogical: No cervical lymphadenopathy. Cardiovascular: Normal rate, regular rhythm. Normal and symmetric distal pulses are present in all extremities. No murmurs, rubs, or gallops. Respiratory: Normal respiratory effort without tachypnea nor retractions. Breath sounds are clear and equal bilaterally. No wheezes/rales/rhonchi. Gastrointestinal: Soft and nontender. No distention. No abdominal bruits. There is no CVA tenderness. Musculoskeletal: Nontender with normal range of motion in all extremities. No joint effusions.  No lower extremity tenderness nor edema. Neurologic:  Normal speech and language. No gross focal neurologic deficits are appreciated. Speech is normal. No gait instability. Skin:  Skin is warm, dry and intact. No rash noted. Psychiatric: Mood and affect are normal. Speech and behavior are normal. Patient exhibits appropriate insight and judgment.  ____________________________________________    LABS (pertinent positives/negatives)  Labs Reviewed  BASIC METABOLIC  PANEL - Abnormal; Notable for the following:    Potassium 3.4 (*)    Glucose, Bld 132 (*)    All other components within normal limits  CBC  TROPONIN I  FIBRIN DERIVATIVES D-DIMER ([facility] ONLY)  HCG, QUANTITATIVE, PREGNANCY    EKG: Interpreted by me. Normal sinus rhythm with normal axis normal intervals, no  evidence of hypertrophy or acute infarction, rate is 90.  ____________________________________________  ED COURSE:  Pertinent labs & imaging results that were available during my care of the patient were reviewed by me and considered in my medical decision making (see chart for details).  I will check basic labs, d-dimer and reevaluate. ____________________________________________   RADIOLOGY  Chest x-ray two-view is normal  ____________________________________________    FINAL ASSESSMENT AND PLAN  Chest pain and shortness of breath, anxiety  Plan: Patient given Valium here, feels better. Will continue outpatient course of same. D-dimer, troponin and EKG are all normal here. Heart score is low risk for major adverse cardiac event. She is no acute distress, stable for outpatient follow-up.    Emily Filbert, MD   Emily Filbert, MD 04/09/15 [phone]

## 2015-04-09 NOTE — ED Notes (Signed)
Pt states that she developed chest pain, and SOB two days ago. She states that it is in her left chest radiates down her left arm and into her neck. She was sent here by Allean FoundHeather Ratcliff to have blood work taken.

## 2015-04-09 NOTE — ED Notes (Signed)
Patient is resting comfortably. 

## 2015-04-09 NOTE — ED Notes (Signed)
Report given to Ida RN.

## 2015-08-12 ENCOUNTER — Ambulatory Visit
Admission: RE | Admit: 2015-08-12 | Discharge: 2015-08-12 | Disposition: A | Discharge status: Home / Self Care | Payer: BLUE CROSS/BLUE SHIELD | Source: Ambulatory Visit | Attending: Medical | Admitting: Medical

## 2015-08-12 ENCOUNTER — Other Ambulatory Visit: Payer: Self-pay | Admitting: Medical

## 2015-08-12 DIAGNOSIS — R059 Cough, unspecified: Secondary | ICD-10-CM

## 2015-08-12 DIAGNOSIS — R05 Cough: Secondary | ICD-10-CM

## 2015-08-12 DIAGNOSIS — R0602 Shortness of breath: Secondary | ICD-10-CM | POA: Diagnosis present

## 2015-08-17 ENCOUNTER — Ambulatory Visit (INDEPENDENT_AMBULATORY_CARE_PROVIDER_SITE_OTHER): Payer: BLUE CROSS/BLUE SHIELD | Admitting: Internal Medicine

## 2015-08-17 ENCOUNTER — Encounter: Payer: Self-pay | Admitting: Internal Medicine

## 2015-08-17 VITALS — BP 136/84 | HR 89 | Ht 68.0 in | Wt 250.0 lb

## 2015-08-17 DIAGNOSIS — R058 Other specified cough: Secondary | ICD-10-CM

## 2015-08-17 DIAGNOSIS — R053 Chronic cough: Secondary | ICD-10-CM | POA: Insufficient documentation

## 2015-08-17 DIAGNOSIS — R05 Cough: Secondary | ICD-10-CM

## 2015-08-17 HISTORY — DX: Other specified cough: R05.8

## 2015-08-17 MED ORDER — PANTOPRAZOLE SODIUM 40 MG PO TBEC: 40.0000 mg | DELAYED_RELEASE_TABLET | Freq: Every day | ORAL | Status: DC

## 2015-08-17 MED ORDER — FAMOTIDINE 20 MG PO TABS: ORAL_TABLET | ORAL | Status: DC

## 2015-08-17 MED ORDER — TRAMADOL HCL 50 MG PO TABS: 50.0000 mg | ORAL_TABLET | Freq: Every day | ORAL | Status: DC | PRN

## 2015-08-17 MED ORDER — PREDNISONE 10 MG PO TABS: ORAL_TABLET | ORAL | Status: DC

## 2015-08-17 NOTE — Progress Notes (Signed)
Subjective:    Patient ID: Melissa Martinez, female    DOB: 12-Jun-1986,   MRN: [id number]  HPI  29 yowf lab tech/ science lab manger at [employer] never smoked healthy as child around age 29 began having tendency to "bad sinus infection"  Up to 4 x yearly and sev times ended up in chest and referred to [facility] and took shots for maybe a year or two seemed to reduce freq of flares then stopped p moved to [facility] and did fine until around 2012 with recurrent sinus infections to the chest intermittent until early September 2016 referred by Healther Ratliff to pulmonary clinic 08/17/2015     08/17/2015 1st [place] [facility] office visit/ Laparis Durrett   Chief Complaint  Patient presents with  . Pulmonary Consult    Self referral. Pt c/o cough for the past 2 wks. She states that 3 wks ago she had a vial sinus infection. She states that cough can be triggered by laughing or talking. She states cough is non prod, and sometimes so severe she gags and sometimes loses urinary continence due to cough.   onset was acute and severe and now vomiting during spells Better if sits quietly/ better sleeping than daytime, no excess or purulent sputum  No obvious other patterns in day to day or daytime variabilty or assoc sob or classically pleuritic or ex cp or chest tightness, subjective wheeze overt  hb symptoms. No unusual exp hx or h/o childhood pna/ asthma or knowledge of premature birth.  Sleeping ok without nocturnal  or early am exacerbation  of respiratory  c/o's or need for noct saba. Also denies any obvious fluctuation of symptoms with weather or environmental changes or other aggravating or alleviating factors except as outlined above   Current Medications, Allergies, Complete Past Medical History, Past Surgical History, Family History, and Social History were reviewed in [employer] record.             Review of Systems  Constitutional: Negative for fever, chills and  unexpected weight change.  HENT: Negative for congestion, dental problem, ear pain, nosebleeds, postnasal drip, rhinorrhea, sinus pressure, sneezing, sore throat, trouble swallowing and voice change.   Eyes: Negative for visual disturbance.  Respiratory: Positive for cough. Negative for choking and shortness of breath.   Cardiovascular: Positive for chest pain. Negative for leg swelling.  Gastrointestinal: Negative for vomiting, abdominal pain and diarrhea.  Genitourinary: Negative for difficulty urinating.  Musculoskeletal: Negative for arthralgias.  Skin: Negative for rash.  Neurological: Positive for headaches. Negative for tremors and syncope.  Hematological: Does not bruise/bleed easily.       Objective:   Physical Exam  amb very hoarse wf nad with severe coughing fits and unable to speak during them for minutes   Wt Readings from Last 3 Encounters:  08/17/15 250 lb (113.399 kg)    Vital signs reviewed   HEENT: nl dentition, turbinates, and orophanx. Nl external ear canals without cough reflex   NECK :  without JVD/Nodes/TM/ nl carotid upstrokes bilaterally   LUNGS: no acc muscle use, clear to A and P bilaterally with cough on insp and exp maneuvers   CV:  RRR  no s3 or murmur or increase in P2, no edema   ABD:  soft and nontender with nl excursion in the supine position. No bruits or organomegaly, bowel sounds nl  MS:  warm without deformities, calf tenderness, cyanosis or clubbing  SKIN: warm and dry without lesions  NEURO:  alert, approp, no deficits       I personally reviewed images and agree with radiology impression as follows:  CXR:  08/12/15 No active disease.           Assessment & Plan:

## 2015-08-17 NOTE — Patient Instructions (Signed)
The key to effective treatment for your cough is eliminating the non-stop cycle of cough you're stuck in long enough to let your airway heal completely and then see if there is anything still making you cough once you stop the cough suppression, but this should take no more than 5 days to figure out  First take delsym two tsp every 12 hours and supplement if needed with  tramadol 50 mg up to 2 every 4 hours to suppress the urge to cough at all or even clear your throat. Swallowing water or using ice chips/non mint and menthol containing candies (such as lifesavers or sugarless jolly ranchers) are also effective.  You should rest your voice and avoid activities that you know make you cough.  Once you have eliminated the cough for 3 straight days try reducing the tramadol first,  then the delsym as tolerated.  For drainage / throat tickle try take CHLORPHENIRAMINE  4 mg - take one every 4 hours as needed - available over the counter- may cause drowsiness so start with just a bedtime dose or two and see how you tolerate it before trying in daytime      Prednisone 10 mg take  4 each am x 2 days,   2 each am x 2 days,  1 each am x 2 days and stop (this is to eliminate allergies and inflammation from coughing)  Protonix (pantoprazole) Take 30-60 min before first meal of the day and Pepcid 20 mg one bedtime plus chlorpheniramine 4 mg x 2 at bedtime (both available over the counter)  until cough is completely gone for at least a week without the need for cough suppression  GERD (REFLUX)  is an extremely common cause of respiratory symptoms, many times with no significant heartburn at all.    It can be treated with medication, but also with lifestyle changes including avoidance of late meals, excessive alcohol, smoking cessation, and avoid fatty foods, chocolate, peppermint, colas, red wine, and acidic juices such as orange juice.  NO MINT OR MENTHOL PRODUCTS SO NO COUGH DROPS  USE HARD CANDY INSTEAD (jolley  ranchers or Stover's or Lifesavers (all available in sugarless versions) NO OIL BASED VITAMINS - use powdered substitutes.  Please schedule a follow up office visit in 2 weeks, sooner if needed

## 2015-08-18 NOTE — Assessment & Plan Note (Addendum)
The most common causes of chronic cough in immunocompetent adults include the following: upper airway cough syndrome (UACS), previously referred to as postnasal drip syndrome (PNDS), which is caused by variety of rhinosinus conditions; (2) asthma; (3) GERD; (4) chronic bronchitis from cigarette smoking or other inhaled environmental irritants; (5) nonasthmatic eosinophilic bronchitis; and (6) bronchiectasis.   These conditions, singly or in combination, have accounted for up to 94% of the causes of chronic cough in prospective studies.   Other conditions have constituted no >6% of the causes in prospective studies These have included bronchogenic carcinoma, chronic interstitial pneumonia, sarcoidosis, left ventricular failure, ACEI-induced cough, and aspiration from a condition associated with pharyngeal dysfunction.    Chronic cough is often simultaneously caused by more than one condition. A single cause has been found from 38 to 82% of the time, multiple causes from 18 to 62%. Multiply caused cough has been the result of three diseases up to 42% of the time.       Based on hx and exam, this is most likely:  Classic Upper airway cough syndrome, so named because it's frequently impossible to sort out how much is  CR/sinusitis with freq throat clearing (which can be related to primary GERD)   vs  causing  secondary (" extra esophageal")  GERD from wide swings in gastric pressure that occur with throat clearing, often  promoting self use of mint and menthol lozenges that reduce the lower esophageal sphincter tone and exacerbate the problem further in a cyclical fashion.   These are the same pts (now being labeled as having "irritable larynx syndrome" by some cough centers) who not infrequently have a history of having failed to tolerate ace inhibitors,  dry powder inhalers or biphosphonates or report having atypical reflux symptoms that don't respond to standard doses of PPI , and are easily confused as  having aecopd or asthma flares by even experienced allergists/ pulmonologists.   The first step is to maximize acid suppression and eliminate cyclical coughing then regroup in 2 weeks perhaps with sinus CT next in w/u.  I had an extended discussion with the patient reviewing all relevant studies completed to date and  lasting 35 min  1) Explained: The standardized cough guidelines published in Chest by [place] in 2006 are still the best available and consist of a multiple step process (up to 12!) , not a single office visit,  and are intended  to address this problem logically,  with an alogrithm dependent on response to empiric treatment at  each progressive step  to determine a specific diagnosis with  minimal addtional testing needed. Therefore if adherence is an issue or can't be accurately verified,  it's very unlikely the standard evaluation and treatment will be successful here.    Furthermore, response to therapy (other than acute cough suppression, which should only be used short term with avoidance of narcotic containing cough syrups if possible), can be a gradual process for which the patient may perceive immediate benefit.  Unlike going to an eye doctor where the best perscription is almost always the first one and is immediately effective, this is almost never the case in the management of chronic cough syndromes. Therefore the patient needs to commit up front to consistently adhere to recommendations  for up to 6 weeks of therapy directed at the likely underlying problem(s) before the response can be reasonably evaluated.     2) Each maintenance medication was reviewed in detail including most importantly the difference between  maintenance and prns and under what circumstances the prns are to be triggered using an action plan format that is not reflected in the computer generated alphabetically organized AVS.    Please see instructions for details which were reviewed in writing  and the patient given a copy highlighting the part that I personally wrote and discussed at today's ov.   See instructions for specific recommendations which were reviewed directly with the patient who was given a copy with highlighter outlining the key components.

## 2015-09-08 ENCOUNTER — Ambulatory Visit: Payer: BLUE CROSS/BLUE SHIELD | Admitting: Internal Medicine

## 2015-09-21 ENCOUNTER — Ambulatory Visit: Payer: BLUE CROSS/BLUE SHIELD | Admitting: Internal Medicine

## 2015-10-19 ENCOUNTER — Encounter: Payer: Self-pay | Admitting: *Deleted

## 2015-10-19 ENCOUNTER — Emergency Department
Admission: EM | Admit: 2015-10-19 | Discharge: 2015-10-19 | Disposition: A | Discharge status: Home / Self Care | Payer: BLUE CROSS/BLUE SHIELD | Attending: Emergency Medicine | Admitting: Emergency Medicine

## 2015-10-19 DIAGNOSIS — T368X5A Adverse effect of other systemic antibiotics, initial encounter: Secondary | ICD-10-CM | POA: Insufficient documentation

## 2015-10-19 DIAGNOSIS — Z7952 Long term (current) use of systemic steroids: Secondary | ICD-10-CM | POA: Diagnosis not present

## 2015-10-19 DIAGNOSIS — Z79899 Other long term (current) drug therapy: Secondary | ICD-10-CM | POA: Insufficient documentation

## 2015-10-19 DIAGNOSIS — L500 Allergic urticaria: Secondary | ICD-10-CM | POA: Diagnosis not present

## 2015-10-19 DIAGNOSIS — L509 Urticaria, unspecified: Secondary | ICD-10-CM

## 2015-10-19 DIAGNOSIS — Z7984 Long term (current) use of oral hypoglycemic drugs: Secondary | ICD-10-CM | POA: Diagnosis not present

## 2015-10-19 DIAGNOSIS — T450X5A Adverse effect of antiallergic and antiemetic drugs, initial encounter: Secondary | ICD-10-CM | POA: Diagnosis not present

## 2015-10-19 DIAGNOSIS — T7840XA Allergy, unspecified, initial encounter: Secondary | ICD-10-CM

## 2015-10-19 MED ORDER — DIPHENHYDRAMINE HCL 25 MG PO CAPS
ORAL_CAPSULE | ORAL | Status: AC
  Filled 2015-10-19: qty 1

## 2015-10-19 MED ORDER — DIPHENHYDRAMINE HCL 25 MG PO CAPS
25.0000 mg | ORAL_CAPSULE | Freq: Once | ORAL | Status: AC
  Administered 2015-10-19: 25 mg via ORAL

## 2015-10-19 MED ORDER — FAMOTIDINE 40 MG PO TABS: 40.0000 mg | ORAL_TABLET | Freq: Every evening | ORAL | Status: DC

## 2015-10-19 MED ORDER — FAMOTIDINE 20 MG PO TABS
40.0000 mg | ORAL_TABLET | Freq: Once | ORAL | Status: AC
Start: 2015-10-19 — End: 2015-10-19
  Administered 2015-10-19: 40 mg via ORAL
  Filled 2015-10-19: qty 2

## 2015-10-19 MED ORDER — METHYLPREDNISOLONE SODIUM SUCC 125 MG IJ SOLR
60.0000 mg | Freq: Once | INTRAMUSCULAR | Status: AC
Start: 2015-10-19 — End: 2015-10-19
  Administered 2015-10-19: 60 mg via INTRAVENOUS
  Filled 2015-10-19: qty 2

## 2015-10-19 NOTE — ED Provider Notes (Signed)
[facility] Emergency Department Provider Note  ____________________________________________  Time seen: Approximately 353 AM  I have reviewed the triage vital signs and the nursing notes.   HISTORY  Chief Complaint Allergic Reaction    HPI Melissa Martinez is a 29 y.o. female who comes into the hospital today with an allergic reaction. The patient reports that she started levofloxacin today for respiratory symptoms around 11pm. The patient reports that she started getting hives after taking the medication. She reports that she took benadryl 25mg  and waited 25 minutes. Her throat started to get tight and her hives did not improve so she decided to come into the hospital for treatment and evaluation. The patient has not had these symptoms in the past. She does have an allergy to sulfa medication. The patient has previously been on amoxicillin for the same symptoms.     Past Medical History  Diagnosis Date  . PCOS (polycystic ovarian syndrome)     Patient Active Problem List   Diagnosis Date Noted  . Upper airway cough syndrome 08/17/2015    Past Surgical History  Procedure Laterality Date  . Cholecystectomy      Current Outpatient Rx  Name  Route  Sig  Dispense  Refill  . Cholecalciferol (VITAMIN D3) 2000 UNITS capsule   Oral   Take by mouth daily.         Marland Kitchen. FLUoxetine (PROZAC) 10 MG capsule   Oral   Take 20 mg by mouth daily.         Marland Kitchen. GIANVI 3-0.02 MG tablet   Oral   Take 1 tablet by mouth daily.           Dispense as written.   . metFORMIN (GLUCOPHAGE-XR) 500 MG 24 hr tablet   Oral   Take 500 mg by mouth daily.         . Multiple Minerals-Vitamins (CALCIUM-MAGNESIUM-ZINC-D3) TABS   Oral   Take 1 tablet by mouth daily.         . predniSONE (STERAPRED UNI-PAK 21 TAB) 10 MG (21) TBPK tablet   Oral   Take 10-60 mg by mouth daily. 6-5-4-3-2-1         . diazepam (VALIUM) 5 MG tablet   Oral   Take 1 tablet (5 mg total) by mouth  every 8 (eight) hours as needed for muscle spasms.   20 tablet   0   . famotidine (PEPCID) 20 MG tablet      One at bedtime   30 tablet   2   . famotidine (PEPCID) 40 MG tablet   Oral   Take 1 tablet (40 mg total) by mouth every evening.   30 tablet   1   . folic acid (FOLVITE) 1 MG tablet   Oral   Take 1 mg by mouth daily.         . pantoprazole (PROTONIX) 40 MG tablet   Oral   Take 1 tablet (40 mg total) by mouth daily. Take 30-60 min before first meal of the day   30 tablet   2   . predniSONE (DELTASONE) 10 MG tablet      Take  4 each am x 2 days,   2 each am x 2 days,  1 each am x 2 days and stop   14 tablet   0   . traMADol (ULTRAM) 50 MG tablet   Oral   Take 1 tablet (50 mg total) by mouth daily as needed.  40 tablet   0     Allergies Bactrim and Levaquin  Family History  Problem Relation Age of Onset  . Heart disease Father   . Breast cancer Maternal Grandmother   . Bladder Cancer Maternal Grandfather     Social History Social History  Substance Use Topics  . Smoking status: Never Smoker   . Smokeless tobacco: Never Used  . Alcohol Use: 0.0 oz/week    0 Standard drinks or equivalent per week     Comment: rarely    Review of Systems Constitutional: No fever/chills Eyes: No visual changes. ENT: throat tightening Cardiovascular: Denies chest pain. Respiratory: Denies shortness of breath. Gastrointestinal: No abdominal pain.  No nausea, no vomiting.  No diarrhea.  No constipation. Genitourinary: Negative for dysuria. Musculoskeletal: Negative for back pain. Skin: hives  Neurological: Negative for headaches, focal weakness or numbness.  10-point ROS otherwise negative.  ____________________________________________   PHYSICAL EXAM:  VITAL SIGNS: ED Triage Vitals  Enc Vitals Group     BP 10/19/15 0302 147/103 mmHg     Pulse Rate 10/19/15 0257 103     Resp 10/19/15 0257 22     Temp 10/19/15 0257 97.5 F (36.4 C)     Temp Source  10/19/15 0257 Oral     SpO2 10/19/15 0257 100 %     Weight 10/19/15 0257 250 lb (113.399 kg)     Height 10/19/15 0257  (1.727 m)     Head Cir --      Peak Flow --      Pain Score 10/19/15 0303 0     Pain Loc --      Pain Edu? --      Excl. in GC? --     Constitutional: Alert and oriented. Well appearing and in no acute distress. Eyes: Conjunctivae are normal. PERRL. EOMI. Head: Atraumatic. Nose: No congestion/rhinnorhea. Mouth/Throat: Mucous membranes are moist.  Oropharynx non-erythematous. Cardiovascular: Normal rate, regular rhythm. Grossly normal heart sounds.  Good peripheral circulation. Respiratory: Normal respiratory effort.  No retractions. Lungs CTAB. Gastrointestinal: Soft and nontender. No distention. Positive bowels sounds Musculoskeletal: No lower extremity tenderness nor edema.   Neurologic:  Normal speech and language.  Skin:  Mild hives to upper arms Psychiatric: Mood and affect are normal.   ____________________________________________   LABS (all labs ordered are listed, but only abnormal results are displayed)  Labs Reviewed - No data to display ____________________________________________  EKG  none ____________________________________________  RADIOLOGY  none ____________________________________________   PROCEDURES  Procedure(s) performed: None  Critical Care performed: No  ____________________________________________   INITIAL IMPRESSION / ASSESSMENT AND PLAN / ED COURSE  Pertinent labs & imaging results that were available during my care of the patient were reviewed by me and considered in my medical decision making (see chart for details).  This is-year-old female who comes in today with hives after taking Levaquin. I gave the patient some Pepcid and prednisone and 25 of Benadryl. I will reassess the patient shortly.  After the medication the patient's hives improved as well as the facial swelling. She'll be discharged home to  follow-up with her primary care physician who has treated her allergies is in the past. ____________________________________________   FINAL CLINICAL IMPRESSION(S) / ED DIAGNOSES  Final diagnoses:  Hives  Allergic reaction caused by a drug      Rebecka Apley, MD 10/19/15 [phone]

## 2015-10-19 NOTE — ED Notes (Signed)
Medication bottle for Levaquin 500 mg, witnessed by this RN.

## 2015-10-19 NOTE — Discharge Instructions (Signed)
Hives Hives are itchy, red, puffy (swollen) areas of the skin. Hives can change in size and location on your body. Hives can come and go for hours, days, or weeks. Hives do not spread from person to person (noncontagious). Scratching, exercise, and stress can make your hives worse. HOME CARE  Avoid things that cause your hives (triggers).  Take antihistamine medicines as told by your doctor. Do not drive while taking an antihistamine.  Take any other medicines for itching as told by your doctor.  Wear loose-fitting clothing.  Keep all doctor visits as told. GET HELP RIGHT AWAY IF:   You have a fever.  Your tongue or lips are puffy.  You have trouble breathing or swallowing.  You feel tightness in the throat or chest.  You have belly (abdominal) pain.  You have lasting or severe itching that is not helped by medicine.  You have painful or puffy joints. These problems may be the first sign of a life-threatening allergic reaction. Call your local emergency services (911 in [place] MAKE SURE YOU:   Understand these instructions.  Will watch your condition.  Will get help right away if you are not doing well or get worse.   This information is not intended to replace advice given to you by your health care provider. Make sure you discuss any questions you have with your health care provider.   Document Released: 08/09/2008 Document Revised: 05/01/2012 Document Reviewed: 01/24/2012 Elsevier Interactive Patient Education [employer]2016 [employer].  Allergies An allergy is an abnormal reaction to a substance by the body's defense system (immune system). Allergies can develop at any age. WHAT CAUSES ALLERGIES? An allergic reaction happens when the immune system mistakenly reacts to a normally harmless substance, called an allergen, as if it were harmful. The immune system releases antibodies to fight the substance. Antibodies eventually release a chemical called histamine into the  bloodstream. The release of histamine is meant to protect the body from infection, but it also causes discomfort. An allergic reaction can be triggered by:  Eating an allergen.  Inhaling an allergen.  Touching an allergen. WHAT TYPES OF ALLERGIES ARE THERE? There are many types of allergies. Common types include:  Seasonal allergies. People with this type of allergy are usually allergic to substances that are only present during certain seasons, such as molds and pollens.  Food allergies.  Drug allergies.  Insect allergies.  Animal dander allergies. WHAT ARE SYMPTOMS OF ALLERGIES? Possible allergy symptoms include:  Swelling of the lips, face, tongue, mouth, or throat.  Sneezing, coughing, or wheezing.  Nasal congestion.  Tingling in the mouth.  Rash.  Itching.  Itchy, red, swollen areas of skin (hives).  Watery eyes.  Vomiting.  Diarrhea.  Dizziness.  Lightheadedness.  Fainting.  Trouble breathing or swallowing.  Chest tightness.  Rapid heartbeat. HOW ARE ALLERGIES DIAGNOSED? Allergies are diagnosed with a medical and family history and one or more of the following:  Skin tests.  Blood tests.  A food diary. A food diary is a record of all the foods and drinks you have in a day and of all the symptoms you experience.  The results of an elimination diet. An elimination diet involves eliminating foods from your diet and then adding them back in one by one to find out if a certain food causes an allergic reaction. HOW ARE ALLERGIES TREATED? There is no cure for allergies, but allergic reactions can be treated with medicine. Severe reactions usually need to be treated at a  hospital. HOW CAN REACTIONS BE PREVENTED? The best way to prevent an allergic reaction is by avoiding the substance you are allergic to. Allergy shots and medicines can also help prevent reactions in some cases. People with severe allergic reactions may be able to prevent a  life-threatening reaction called anaphylaxis with a medicine given right after exposure to the allergen.   This information is not intended to replace advice given to you by your health care provider. Make sure you discuss any questions you have with your health care provider.   Document Released: 01/24/2003 Document Revised: 11/21/2014 Document Reviewed: 08/12/2014 Elsevier Interactive Patient Education [employer]

## 2015-10-19 NOTE — ED Notes (Signed)
Patient with no complaints at this time. Respirations even and unlabored. Skin warm/dry. Discharge instructions reviewed with patient at this time. Patient given opportunity to voice concerns/ask questions. IV removed per policy and band-aid applied to site. Patient discharged at this time and left Emergency Department with steady gait.  

## 2015-10-19 NOTE — ED Notes (Signed)
Pt states she took her first dose of Levaquin tonight and began itching and throat swelling x 1 hr later. Pt has taken benadryl 25 mg PO at 0230.

## 2015-11-18 LAB — OB RESULTS CONSOLE GC/CHLAMYDIA
Chlamydia: NEGATIVE
Gonorrhea: NEGATIVE

## 2015-11-18 LAB — OB RESULTS CONSOLE HIV ANTIBODY (ROUTINE TESTING): HIV: NONREACTIVE

## 2015-11-18 LAB — OB RESULTS CONSOLE RPR: RPR: NONREACTIVE

## 2015-11-18 LAB — OB RESULTS CONSOLE RUBELLA ANTIBODY, IGM: RUBELLA: IMMUNE

## 2015-11-18 LAB — OB RESULTS CONSOLE VARICELLA ZOSTER ANTIBODY, IGG: VARICELLA IGG: IMMUNE

## 2015-11-18 LAB — OB RESULTS CONSOLE PLATELET COUNT: PLATELETS: 370 10*3/uL

## 2015-11-20 ENCOUNTER — Other Ambulatory Visit
Admission: RE | Admit: 2015-11-20 | Discharge: 2015-11-20 | Disposition: A | Discharge status: Home / Self Care | Payer: BLUE CROSS/BLUE SHIELD | Source: Ambulatory Visit | Attending: Otolaryngology | Admitting: Otolaryngology

## 2015-11-20 ENCOUNTER — Other Ambulatory Visit
Admission: RE | Admit: 2015-11-20 | Discharge: 2015-11-20 | Disposition: A | Discharge status: Home / Self Care | Payer: BLUE CROSS/BLUE SHIELD | Source: Ambulatory Visit | Attending: Obstetrics and Gynecology | Admitting: Obstetrics and Gynecology

## 2015-11-20 DIAGNOSIS — Z3200 Encounter for pregnancy test, result unknown: Secondary | ICD-10-CM | POA: Diagnosis not present

## 2015-11-20 LAB — HCG, QUANTITATIVE, PREGNANCY: HCG, BETA CHAIN, QUANT, S: 228 m[IU]/mL — AB (ref <5)

## 2015-11-24 LAB — MISC LABCORP TEST (SEND OUT): Labcorp test code: 672858

## 2015-12-14 ENCOUNTER — Encounter: Payer: Self-pay | Admitting: *Deleted

## 2015-12-14 DIAGNOSIS — E282 Polycystic ovarian syndrome: Secondary | ICD-10-CM | POA: Insufficient documentation

## 2015-12-14 DIAGNOSIS — N200 Calculus of kidney: Secondary | ICD-10-CM

## 2015-12-14 HISTORY — DX: Calculus of kidney: N20.0

## 2015-12-15 ENCOUNTER — Ambulatory Visit (INDEPENDENT_AMBULATORY_CARE_PROVIDER_SITE_OTHER): Payer: BLUE CROSS/BLUE SHIELD | Admitting: Obstetrics and Gynecology

## 2015-12-15 ENCOUNTER — Encounter: Payer: Self-pay | Admitting: Obstetrics and Gynecology

## 2015-12-15 VITALS — BP 141/82 | HR 92 | Resp 16 | Ht 68.0 in | Wt 260.8 lb

## 2015-12-15 DIAGNOSIS — R109 Unspecified abdominal pain: Secondary | ICD-10-CM | POA: Diagnosis not present

## 2015-12-15 DIAGNOSIS — Z87442 Personal history of urinary calculi: Secondary | ICD-10-CM | POA: Diagnosis not present

## 2015-12-15 LAB — MICROSCOPIC EXAMINATION: BACTERIA UA: NONE SEEN

## 2015-12-15 LAB — URINALYSIS, COMPLETE
Bilirubin, UA: NEGATIVE
Glucose, UA: NEGATIVE
Ketones, UA: NEGATIVE
Leukocytes, UA: NEGATIVE
Nitrite, UA: NEGATIVE
PH UA: 5.5 (ref 5.0–7.5)
Protein, UA: NEGATIVE
RBC UA: NEGATIVE
SPEC GRAV UA: 1.02 (ref 1.005–1.030)
Urobilinogen, Ur: 0.2 mg/dL (ref 0.2–1.0)

## 2015-12-15 NOTE — Progress Notes (Signed)
12/15/2015 2:35 PM   Melissa Martinez 11-23-1985 [id number]  Referring provider: No referring provider defined for this encounter.  Chief Complaint  Patient presents with  . Nephrolithiasis  . Establish Care    HPI: Patient is a 30 year old female presenting today with complaints of bladder spasms in left flank pain beginning 2 weeks ago. She is currently [redacted] weeks pregnant. She does have a history of kidney stones. She has expressed 2 previous stone episodes one requiring URS with stent placement in 2014. The other stone she reports she believes she passed spontaneously.  She reports that she has been expressing some increase in urinary frequency but no dysuria. She denies any gross hematuria or fevers.  Mother had kidney stones.     PMH: Past Medical History  Diagnosis Date  . PCOS (polycystic ovarian syndrome)   . Upper airway cough syndrome 08/17/2015    Followed in Pulmonary clinic/ [facility]/ [facility]    . Abdominal pain, generalized 11/11/2014  . Bleeding per rectum 11/11/2014  . Altered bowel function 11/11/2014  . Fatty infiltration of liver 11/11/2014  . Calculus of kidney 12/14/2015    Surgical History: Past Surgical History  Procedure Laterality Date  . Cholecystectomy      Home Medications:    Medication List       This list is accurate as of: 12/15/15 11:59 PM.  Always use your most recent med list.               DICLEGIS PO  Take by mouth.     FLUoxetine 10 MG capsule  Commonly known as:  PROZAC  Take 20 mg by mouth daily.     metFORMIN 500 MG 24 hr tablet  Commonly known as:  GLUCOPHAGE-XR  Take 500 mg by mouth daily.     PRENATAL VITAMIN PO  Take 1 tablet by mouth daily.     VITAMIN C (CALCIUM ASCORBATE) PO  Take by mouth.     Vitamin D3 2000 units capsule  Take by mouth daily.        Allergies:  Allergies  Allergen Reactions  . Bactrim [Sulfamethoxazole-Trimethoprim] Itching  . Levaquin [Levofloxacin In D5w]     Family  History: Family History  Problem Relation Age of Onset  . Heart disease Father   . Breast cancer Maternal Grandmother   . Bladder Cancer Maternal Grandfather   . Kidney Stones Mother     Social History:  reports that she has never smoked. She has never used smokeless tobacco. She reports that she drinks alcohol. She reports that she does not use illicit drugs.  ROS: UROLOGY Frequent Urination?: Yes Hard to postpone urination?: No Burning/pain with urination?: No Get up at night to urinate?: Yes Leakage of urine?: No Urine stream starts and stops?: No Trouble starting stream?: No Do you have to strain to urinate?: No Blood in urine?: No Urinary tract infection?: No Sexually transmitted disease?: No Injury to kidneys or bladder?: No Painful intercourse?: No Weak stream?: No Currently pregnant?: Yes Vaginal bleeding?: No Last menstrual period?: 10/12/15  Gastrointestinal Nausea?: Yes Vomiting?: Yes Indigestion/heartburn?: No Diarrhea?: Yes Constipation?: Yes  Constitutional Fever: No Night sweats?: Yes Weight loss?: No Fatigue?: Yes  Skin Skin rash/lesions?: No Itching?: No  Eyes Blurred vision?: No Double vision?: No  Ears/Nose/Throat Sore throat?: No Sinus problems?: Yes  Hematologic/Lymphatic Swollen glands?: No Easy bruising?: No  Cardiovascular Leg swelling?: No Chest pain?: No  Respiratory Cough?: No Shortness of breath?: No  Endocrine Excessive thirst?: No  Musculoskeletal Back pain?: No Joint pain?: No  Neurological Headaches?: Yes Dizziness?: Yes  Psychologic Depression?: No Anxiety?: No  Physical Exam: BP 141/82 mmHg  Pulse 92  Resp 16  Ht  (1.727 m)  Wt 260 lb 12.8 oz (118.298 kg)  BMI 39.66 kg/m2  Constitutional:  Alert and oriented, No acute distress. HEENT:  AT, moist mucus membranes.  Trachea midline, no masses. Cardiovascular: No clubbing, cyanosis, or edema. Respiratory: Normal respiratory effort, no  increased work of breathing. GI: Abdomen is soft, nontender, nondistended, no abdominal masses GU: No CVA tenderness.  Skin: No rashes, bruises or suspicious lesions. Lymph: No cervical or inguinal adenopathy. Neurologic: Grossly intact, no focal deficits, moving all 4 extremities. Psychiatric: Normal mood and affect.  Laboratory Data:  Lab Results  Component Value Date   WBC 8.2 04/09/2015   HGB 12.5 04/09/2015   HCT 36.7 04/09/2015   MCV 82.0 04/09/2015   PLT 312 04/09/2015    Lab Results  Component Value Date   CREATININE 0.64 04/09/2015    No results found for: PSA  No results found for: TESTOSTERONE  Lab Results  Component Value Date   HGBA1C 5.6 01/16/2012    Urinalysis Results for orders placed or performed in visit on 12/15/15  Microscopic Examination  Result Value Ref Range   WBC, UA 0-5 0 -  5 /hpf   RBC, UA 0-2 0 -  2 /hpf   Epithelial Cells (non renal) 0-10 0 - 10 /hpf   Mucus, UA Present (A) Not Estab.   Bacteria, UA None seen None seen/Few  Urinalysis, Complete  Result Value Ref Range   Specific Gravity, UA 1.020 1.005 - 1.030   pH, UA 5.5 5.0 - 7.5   Color, UA Yellow Yellow   Appearance Ur Clear Clear   Leukocytes, UA Negative Negative   Protein, UA Negative Negative/Trace   Glucose, UA Negative Negative   Ketones, UA Negative Negative   RBC, UA Negative Negative   Bilirubin, UA Negative Negative   Urobilinogen, Ur 0.2 0.2 - 1.0 mg/dL   Nitrite, UA Negative Negative   Microscopic Examination See below:     Pertinent Imaging:   Assessment & Plan:    1. Flank pain-  Left.  UA unremarkable. We will obtain a renal ultrasound for further evaluation. A stent is in her first trimester pregnancy and we will attempt to avoid any other radiation exposures. Patient was advised to seek immediate medical attention for severe flank pain, fevers or uncontrolled nausea and vomiting. - Urinalysis, Complete  2. History of Kidney stones-   Return for  [place] results.  These notes generated with voice recognition software. I apologize for typographical errors.  Earlie Lou, FNP  [facility] [address], [address] [place], [place] [address] [phone]

## 2015-12-23 ENCOUNTER — Ambulatory Visit
Admission: RE | Admit: 2015-12-23 | Discharge: 2015-12-23 | Disposition: A | Discharge status: Home / Self Care | Payer: BLUE CROSS/BLUE SHIELD | Source: Ambulatory Visit | Attending: Obstetrics and Gynecology | Admitting: Obstetrics and Gynecology

## 2015-12-23 DIAGNOSIS — R109 Unspecified abdominal pain: Secondary | ICD-10-CM | POA: Diagnosis not present

## 2015-12-23 DIAGNOSIS — K760 Fatty (change of) liver, not elsewhere classified: Secondary | ICD-10-CM | POA: Diagnosis not present

## 2015-12-23 IMAGING — US US RENAL
1 series · 14 of 25 positions shown · non-contrast
Comparison: [DATE] CT abdomen/pelvis.

CLINICAL DATA: Left flank pain for over 1 month. History of
nephrolithiasis.

EXAM:
RENAL / URINARY TRACT ULTRASOUND COMPLETE

[Series 1: us renal · 0.25mm/px · 14 of 26 slices shown]
[im 1/26]
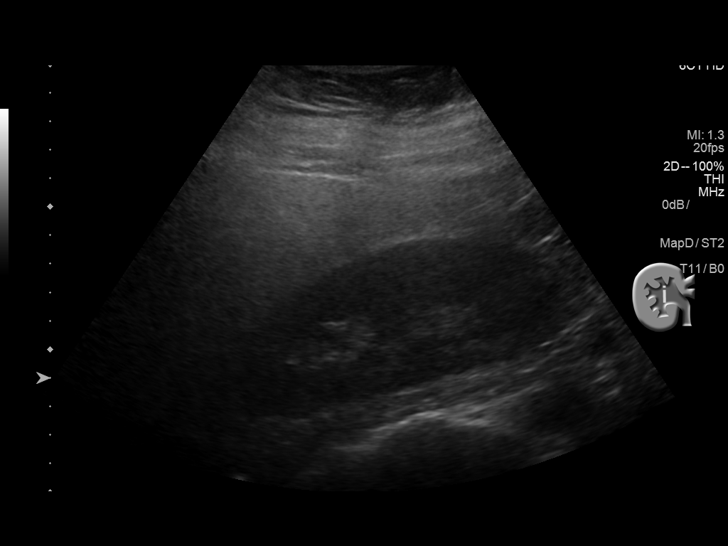
[im 3/26]
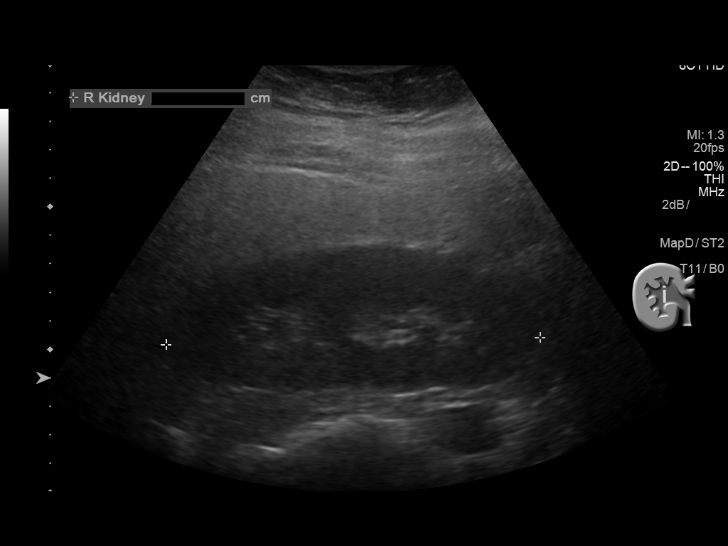
[im 5/26]
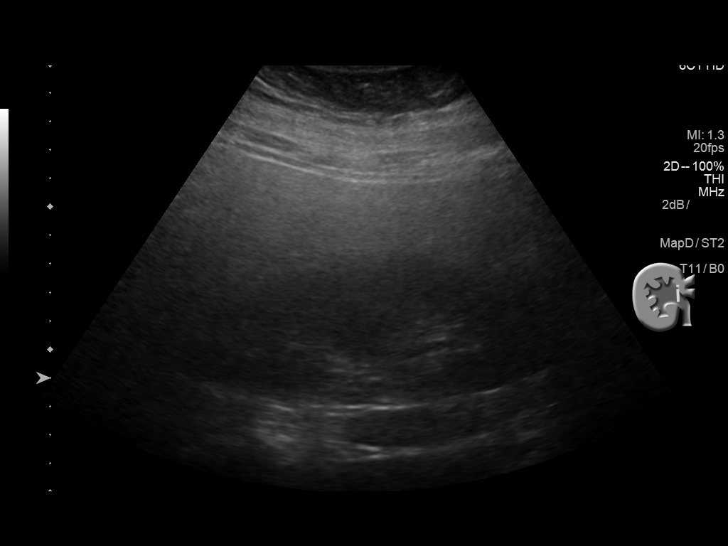
[im 7/26]
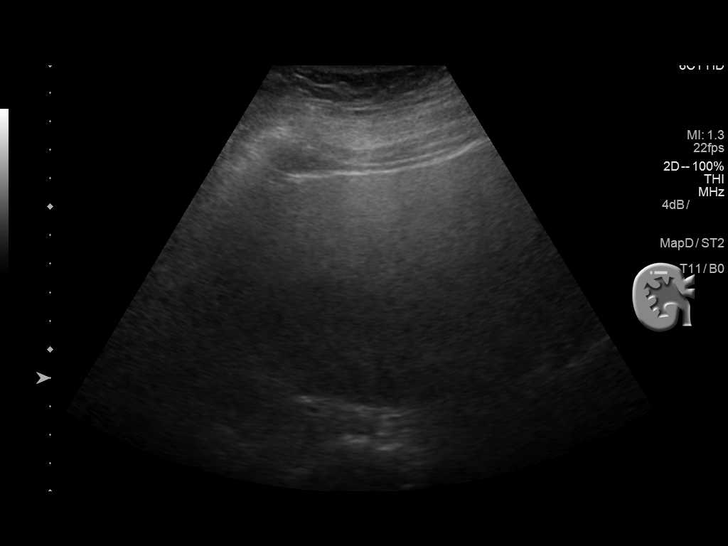
[im 9/26]
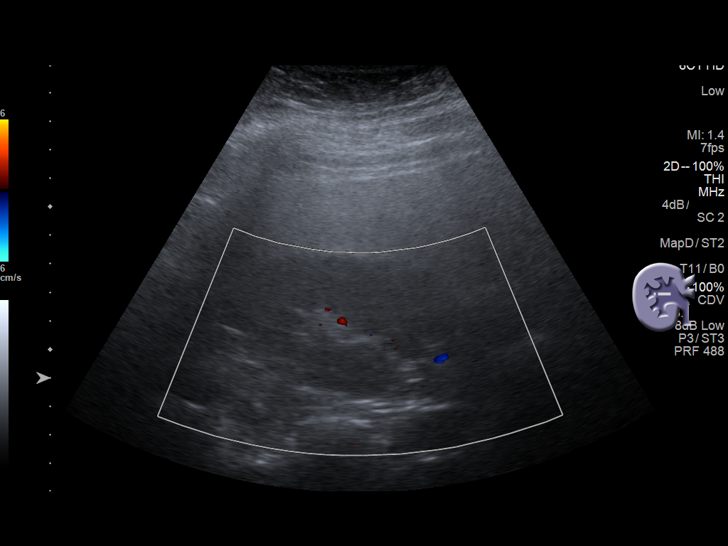
[im 10/26]
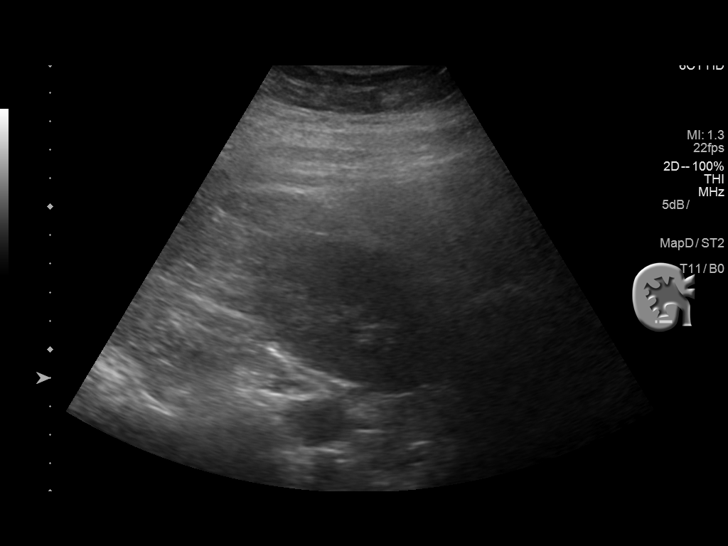
[im 12/26]
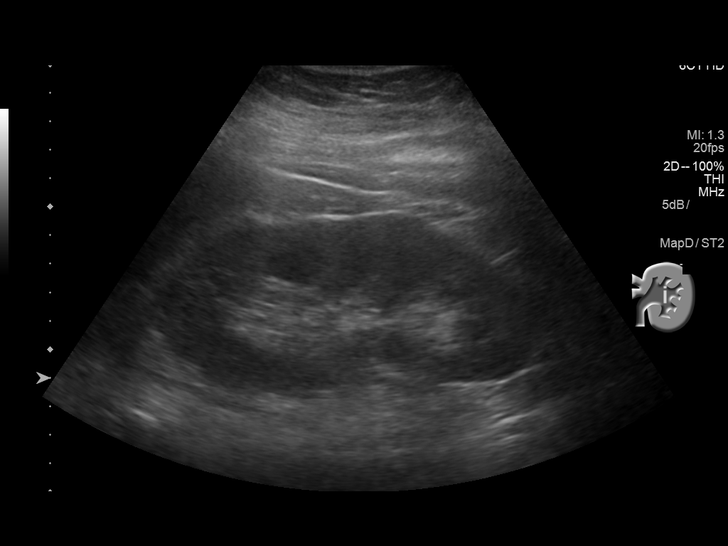
[im 14/26]
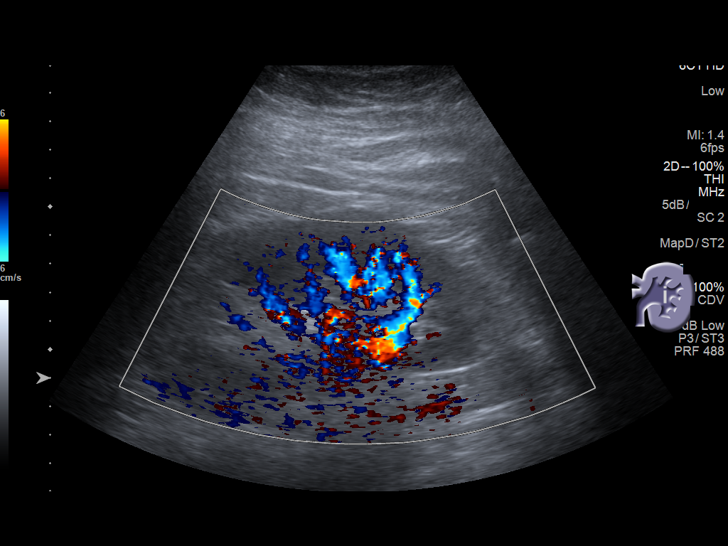
[im 16/26]
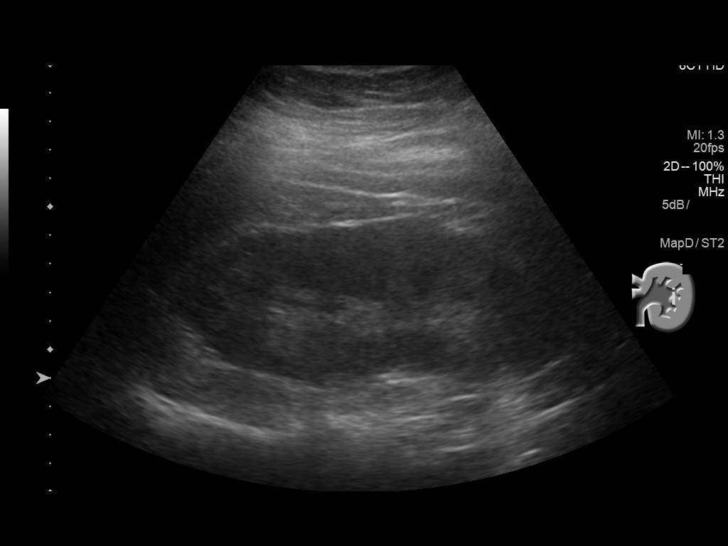
[im 17/26]
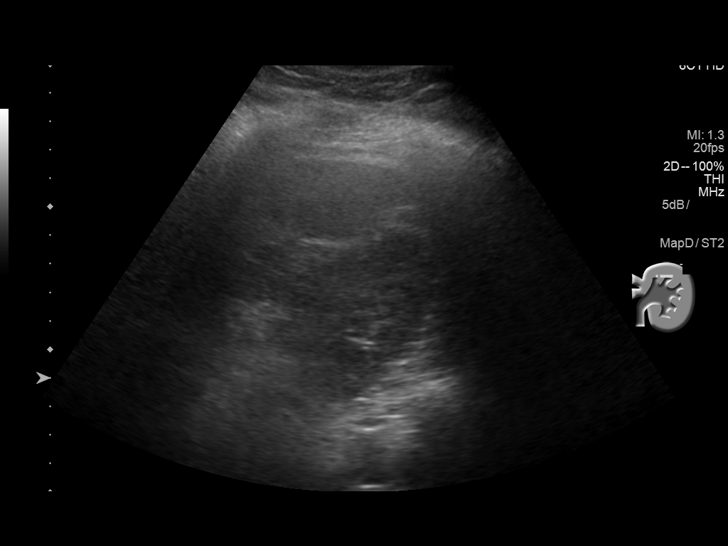
[im 19/26]
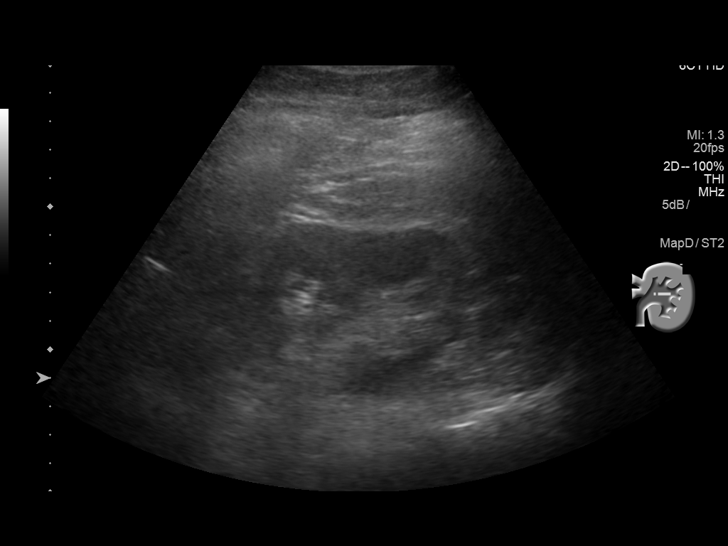
[im 21/26]
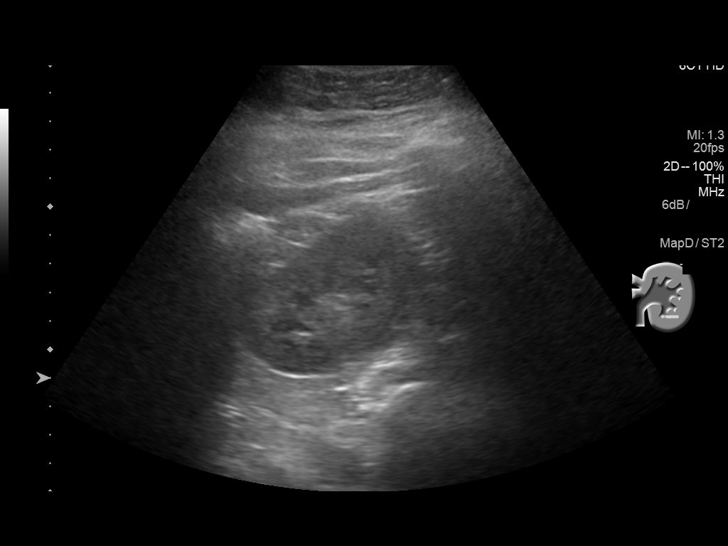
[im 23/26]
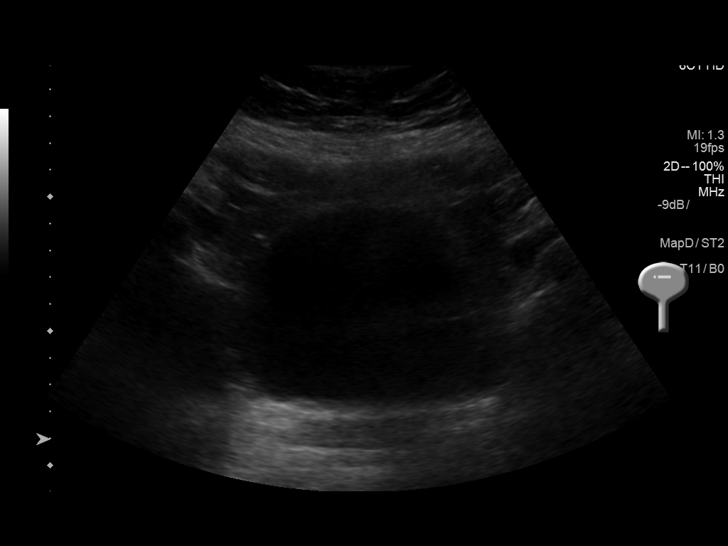
[im 26/26]
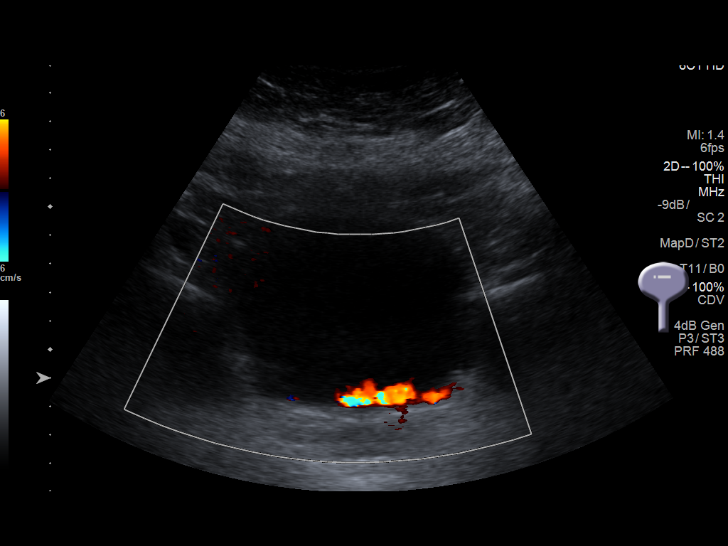

[14 of 25 positions shown; findings below may reference images not displayed]

FINDINGS: Right Kidney:

Length: 13.1 cm. Echogenicity within normal limits. No mass or
hydronephrosis visualized. No stones large enough to cause acoustic
shadowing. Incidentally noted is prominently echogenic liver
parenchyma in the visualized right liver lobe, indicating diffuse
hepatic steatosis.

Left Kidney:

Length: 13.5 cm. Echogenicity within normal limits. No mass or
hydronephrosis visualized. No stones large enough to cause acoustic
shadowing.

Bladder:

Appears normal for degree of bladder distention. Both ureteral jets
are demonstrated in the bladder lumen.
IMPRESSION: 1. Normal ultrasound of the kidneys and bladder, with no evidence of
urinary tract obstruction.
2. Incidental diffuse hepatic steatosis.

## 2015-12-24 ENCOUNTER — Encounter: Payer: Self-pay | Admitting: Obstetrics and Gynecology

## 2015-12-24 ENCOUNTER — Ambulatory Visit (INDEPENDENT_AMBULATORY_CARE_PROVIDER_SITE_OTHER): Payer: BLUE CROSS/BLUE SHIELD | Admitting: Obstetrics and Gynecology

## 2015-12-24 VITALS — BP 146/97 | HR 93 | Resp 16 | Ht 68.0 in | Wt 263.8 lb

## 2015-12-24 DIAGNOSIS — N3289 Other specified disorders of bladder: Secondary | ICD-10-CM

## 2015-12-24 NOTE — Progress Notes (Signed)
11:40 AM   Melissa Martinez 04/18/1986 [id number]  Referring provider: No referring provider defined for this encounter.  Chief Complaint  Patient presents with  . Results    [place]  . Flank Pain    HPI: Patient is a 30 year old female presenting today with complaints of bladder spasms in left flank pain beginning 2 weeks ago. She is currently [redacted] weeks pregnant. She does have a history of kidney stones. She has expressed 2 previous stone episodes one requiring URS with stent placement in 2014. The other stone she reports she believes she passed spontaneously.  She reports that she has been expressing some increase in urinary frequency but no dysuria. She denies any gross hematuria or fevers.  Mother had kidney stones.     Interval History 12/24/15 Patient presents today for follow up of above complaint and to review her RUS results. She has experience no further spasms. Flank pain or any other urinary symptoms.  RUS negative for visible stones or obstruction.    PMH: Past Medical History  Diagnosis Date  . PCOS (polycystic ovarian syndrome)   . Upper airway cough syndrome 08/17/2015    Followed in Pulmonary clinic/ [facility]/ [facility]    . Abdominal pain, generalized 11/11/2014  . Bleeding per rectum 11/11/2014  . Altered bowel function 11/11/2014  . Fatty infiltration of liver 11/11/2014  . Calculus of kidney 12/14/2015    Surgical History: Past Surgical History  Procedure Laterality Date  . Cholecystectomy      Home Medications:    Medication List       This list is accurate as of: 12/24/15 11:40 AM.  Always use your most recent med list.               DICLEGIS PO  Take by mouth.     FLUoxetine 10 MG capsule  Commonly known as:  PROZAC  Take 20 mg by mouth daily.     metFORMIN 500 MG 24 hr tablet  Commonly known as:  GLUCOPHAGE-XR  Take 500 mg by mouth daily.     PRENATAL VITAMIN PO  Take 1 tablet by mouth daily.     VITAMIN C (CALCIUM ASCORBATE) PO   Take by mouth.     Vitamin D3 2000 units capsule  Take by mouth daily.        Allergies:  Allergies  Allergen Reactions  . Bactrim [Sulfamethoxazole-Trimethoprim] Itching  . Levaquin [Levofloxacin In D5w]     Family History: Family History  Problem Relation Age of Onset  . Heart disease Father   . Breast cancer Maternal Grandmother   . Bladder Cancer Maternal Grandfather   . Kidney Stones Mother     Social History:  reports that she has never smoked. She has never used smokeless tobacco. She reports that she drinks alcohol. She reports that she does not use illicit drugs.  ROS: UROLOGY Frequent Urination?: Yes Hard to postpone urination?: No Burning/pain with urination?: No Get up at night to urinate?: Yes Leakage of urine?: No Urine stream starts and stops?: No Trouble starting stream?: No Do you have to strain to urinate?: No Blood in urine?: No Urinary tract infection?: No Sexually transmitted disease?: No Injury to kidneys or bladder?: No Painful intercourse?: No Weak stream?: No Currently pregnant?: Yes Vaginal bleeding?: No Last menstrual period?: 10/12/15  Gastrointestinal Nausea?: Yes Vomiting?: No Indigestion/heartburn?: No Diarrhea?: No Constipation?: No  Constitutional Fever: No Night sweats?: Yes Weight loss?: No Fatigue?: No  Skin Skin rash/lesions?: No Itching?: No  Eyes Blurred vision?: No Double vision?: No  Ears/Nose/Throat Sore throat?: No Sinus problems?: No  Hematologic/Lymphatic Swollen glands?: No Easy bruising?: No  Cardiovascular Leg swelling?: No Chest pain?: No  Respiratory Cough?: No Shortness of breath?: Yes  Endocrine Excessive thirst?: No  Musculoskeletal Back pain?: No Joint pain?: No  Neurological Headaches?: No Dizziness?: No  Psychologic Depression?: No Anxiety?: No  Physical Exam: BP 146/97 mmHg  Pulse 93  Resp 16  Ht 5\' 8"  (1.727 m)  Wt 263 lb 12.8 oz (119.659 kg)  BMI 40.12  kg/m2  Constitutional:  Alert and oriented, No acute distress. HEENT: [place] AT, moist mucus membranes.  Trachea midline, no masses. Cardiovascular: No clubbing, cyanosis, or edema. Respiratory: Normal respiratory effort, no increased work of breathing.  Skin: No rashes, bruises or suspicious lesions. Neurologic: Grossly intact, no focal deficits, moving all 4 extremities. Psychiatric: Normal mood and affect.  Laboratory Data:  Lab Results  Component Value Date   WBC 8.2 04/09/2015   HGB 12.5 04/09/2015   HCT 36.7 04/09/2015   MCV 82.0 04/09/2015   PLT 312 04/09/2015    Lab Results  Component Value Date   CREATININE 0.64 04/09/2015    No results found for: PSA  No results found for: TESTOSTERONE  Lab Results  Component Value Date   HGBA1C 5.6 01/16/2012    Urinalysis Results for orders placed or performed in visit on 12/15/15  Microscopic Examination  Result Value Ref Range   WBC, UA 0-5 0 -  5 /hpf   RBC, UA 0-2 0 -  2 /hpf   Epithelial Cells (non renal) 0-10 0 - 10 /hpf   Mucus, UA Present (A) Not Estab.   Bacteria, UA None seen None seen/Few  Urinalysis, Complete  Result Value Ref Range   Specific Gravity, UA 1.020 1.005 - 1.030   pH, UA 5.5 5.0 - 7.5   Color, UA Yellow Yellow   Appearance Ur Clear Clear   Leukocytes, UA Negative Negative   Protein, UA Negative Negative/Trace   Glucose, UA Negative Negative   Ketones, UA Negative Negative   RBC, UA Negative Negative   Bilirubin, UA Negative Negative   Urobilinogen, Ur 0.2 0.2 - 1.0 mg/dL   Nitrite, UA Negative Negative   Microscopic Examination See below:     Pertinent Imaging: CLINICAL DATA: Left flank pain for over 1 month. History of nephrolithiasis.  EXAM: RENAL / URINARY TRACT ULTRASOUND COMPLETE COMPARISON: 04/18/2014 CT abdomen/pelvis. FINDINGS: Right Kidney: Length: 13.1 cm. Echogenicity within normal limits. No mass or hydronephrosis visualized. No stones large enough to cause  acoustic shadowing. Incidentally noted is prominently echogenic liver parenchyma in the visualized right liver lobe, indicating diffuse hepatic steatosis. Left Kidney: Length: 13.5 cm. Echogenicity within normal limits. No mass or hydronephrosis visualized. No stones large enough to cause acoustic shadowing. Bladder: Appears normal for degree of bladder distention. Both ureteral jets are demonstrated in the bladder lumen.  IMPRESSION: 1. Normal ultrasound of the kidneys and bladder, with no evidence of urinary tract obstruction. 2. Incidental diffuse hepatic steatosis. Electronically Signed  By: Delbert Phenix M.D.  On: 12/23/2015 11:59   Assessment & Plan:    1. Flank pain-  Left.  UA unremarkable.Normal ultrasound of the kidneys and bladder, with no evidence of urinary tract obstruction.  - Urinalysis, Complete  2. Hepatic Steatosis- Incidental findng on RUS.  Recommended f/u with PCP.  3. History of Kidney stones-    Return if symptoms worsen or fail to improve.  These  notes generated with voice recognition software. I apologize for typographical errors.  Earlie Lou, FNP  [facility] [address] [place], [place] [address] [phone]

## 2015-12-29 ENCOUNTER — Ambulatory Visit: Payer: BLUE CROSS/BLUE SHIELD | Admitting: Obstetrics and Gynecology

## 2016-06-19 ENCOUNTER — Inpatient Hospital Stay
Admission: EM | Admit: 2016-06-19 | Discharge: 2016-06-26 | DRG: 765 | Disposition: A | Discharge status: Home / Self Care | Payer: BLUE CROSS/BLUE SHIELD | Attending: Obstetrics & Gynecology | Admitting: Obstetrics & Gynecology

## 2016-06-19 ENCOUNTER — Encounter: Payer: Self-pay | Admitting: Emergency Medicine

## 2016-06-19 DIAGNOSIS — R079 Chest pain, unspecified: Secondary | ICD-10-CM | POA: Diagnosis not present

## 2016-06-19 DIAGNOSIS — Z3A34 34 weeks gestation of pregnancy: Secondary | ICD-10-CM

## 2016-06-19 DIAGNOSIS — O360930 Maternal care for other rhesus isoimmunization, third trimester, not applicable or unspecified: Secondary | ICD-10-CM | POA: Diagnosis present

## 2016-06-19 DIAGNOSIS — Z6841 Body Mass Index (BMI) 40.0 and over, adult: Secondary | ICD-10-CM

## 2016-06-19 DIAGNOSIS — Z7984 Long term (current) use of oral hypoglycemic drugs: Secondary | ICD-10-CM

## 2016-06-19 DIAGNOSIS — O99343 Other mental disorders complicating pregnancy, third trimester: Secondary | ICD-10-CM | POA: Diagnosis present

## 2016-06-19 DIAGNOSIS — O1413 Severe pre-eclampsia, third trimester: Principal | ICD-10-CM | POA: Diagnosis present

## 2016-06-19 DIAGNOSIS — O99333 Smoking (tobacco) complicating pregnancy, third trimester: Secondary | ICD-10-CM | POA: Diagnosis present

## 2016-06-19 DIAGNOSIS — Z888 Allergy status to other drugs, medicaments and biological substances status: Secondary | ICD-10-CM

## 2016-06-19 DIAGNOSIS — O1490 Unspecified pre-eclampsia, unspecified trimester: Secondary | ICD-10-CM | POA: Diagnosis present

## 2016-06-19 DIAGNOSIS — O99213 Obesity complicating pregnancy, third trimester: Secondary | ICD-10-CM | POA: Diagnosis present

## 2016-06-19 DIAGNOSIS — O26613 Liver and biliary tract disorders in pregnancy, third trimester: Secondary | ICD-10-CM | POA: Diagnosis present

## 2016-06-19 DIAGNOSIS — F429 Obsessive-compulsive disorder, unspecified: Secondary | ICD-10-CM | POA: Diagnosis present

## 2016-06-19 DIAGNOSIS — Z79899 Other long term (current) drug therapy: Secondary | ICD-10-CM

## 2016-06-19 DIAGNOSIS — F17210 Nicotine dependence, cigarettes, uncomplicated: Secondary | ICD-10-CM | POA: Diagnosis present

## 2016-06-19 DIAGNOSIS — D62 Acute posthemorrhagic anemia: Secondary | ICD-10-CM | POA: Diagnosis not present

## 2016-06-19 DIAGNOSIS — Z23 Encounter for immunization: Secondary | ICD-10-CM

## 2016-06-19 DIAGNOSIS — K760 Fatty (change of) liver, not elsewhere classified: Secondary | ICD-10-CM | POA: Diagnosis present

## 2016-06-19 DIAGNOSIS — O99283 Endocrine, nutritional and metabolic diseases complicating pregnancy, third trimester: Secondary | ICD-10-CM | POA: Diagnosis present

## 2016-06-19 DIAGNOSIS — F419 Anxiety disorder, unspecified: Secondary | ICD-10-CM | POA: Diagnosis present

## 2016-06-19 HISTORY — DX: Anxiety disorder, unspecified: F41.9

## 2016-06-19 HISTORY — DX: Adverse effect of unspecified anesthetic, initial encounter: T41.45XA

## 2016-06-19 HISTORY — DX: Other complications of anesthesia, initial encounter: T88.59XA

## 2016-06-19 LAB — COMPREHENSIVE METABOLIC PANEL
ALBUMIN: 2.9 g/dL — AB (ref 3.5–5.0)
ALT: 12 U/L — AB (ref 14–54)
AST: 19 U/L (ref 15–41)
Alkaline Phosphatase: 188 U/L — ABNORMAL HIGH (ref 38–126)
Anion gap: 11 (ref 5–15)
BUN: 8 mg/dL (ref 6–20)
CHLORIDE: 106 mmol/L (ref 101–111)
CO2: 18 mmol/L — AB (ref 22–32)
CREATININE: 0.62 mg/dL (ref 0.44–1.00)
Calcium: 9.1 mg/dL (ref 8.9–10.3)
GFR calc Af Amer: >60 mL/min (ref >60)
GFR calc non Af Amer: >60 mL/min (ref >60)
Glucose, Bld: 151 mg/dL — ABNORMAL HIGH (ref 65–99)
POTASSIUM: 3.8 mmol/L (ref 3.5–5.1)
SODIUM: 135 mmol/L (ref 135–145)
Total Bilirubin: 0.3 mg/dL (ref 0.3–1.2)
Total Protein: 6.9 g/dL (ref 6.5–8.1)

## 2016-06-19 LAB — CBC WITH DIFFERENTIAL/PLATELET
Basophils Absolute: 0.1 10*3/uL (ref 0–0.1)
Basophils Relative: 1 %
EOS ABS: 0.1 10*3/uL (ref 0–0.7)
EOS PCT: 1 %
HCT: 34.3 % — ABNORMAL LOW (ref 35.0–47.0)
HEMOGLOBIN: 12.1 g/dL (ref 12.0–16.0)
LYMPHS ABS: 2.4 10*3/uL (ref 1.0–3.6)
LYMPHS PCT: 23 %
MCH: 29 pg (ref 26.0–34.0)
MCHC: 35.3 g/dL (ref 32.0–36.0)
MCV: 82.1 fL (ref 80.0–100.0)
MONOS PCT: 7 %
Monocytes Absolute: 0.7 10*3/uL (ref 0.2–0.9)
Neutro Abs: 6.9 10*3/uL — ABNORMAL HIGH (ref 1.4–6.5)
Neutrophils Relative %: 68 %
PLATELETS: 281 10*3/uL (ref 150–440)
RBC: 4.18 MIL/uL (ref 3.80–5.20)
RDW: 13.8 % (ref 11.5–14.5)
WBC: 10.3 10*3/uL (ref 3.6–11.0)

## 2016-06-19 LAB — URINALYSIS COMPLETE WITH MICROSCOPIC (ARMC ONLY)
BILIRUBIN URINE: NEGATIVE
Glucose, UA: NEGATIVE mg/dL
HGB URINE DIPSTICK: NEGATIVE
Ketones, ur: NEGATIVE mg/dL
Leukocytes, UA: NEGATIVE
Nitrite: NEGATIVE
PH: 6 (ref 5.0–8.0)
Protein, ur: NEGATIVE mg/dL
Specific Gravity, Urine: 1.018 (ref 1.005–1.030)

## 2016-06-19 LAB — TROPONIN I: Troponin I: <0.03 ng/mL (ref <0.03)

## 2016-06-19 MED ORDER — GI COCKTAIL ~~LOC~~
ORAL | Status: AC
  Administered 2016-06-19: 30 mL via ORAL
  Filled 2016-06-19: qty 30

## 2016-06-19 MED ORDER — GI COCKTAIL ~~LOC~~
30.0000 mL | Freq: Once | ORAL | Status: AC
  Administered 2016-06-19: 30 mL via ORAL

## 2016-06-19 NOTE — ED Provider Notes (Signed)
[facility] Emergency Department Provider Note   ____________________________________________   First MD Initiated Contact with Patient 06/19/16 2140     (approximate)  I have reviewed the triage vital signs and the nursing notes.   HISTORY  Chief Complaint Chest Pain    HPI Melissa Martinez is a 30 y.o. female who reports yesterday morning she had some pain in the center of her chest when she went when she got up as she stood up the pain went away. This morning she had the same thing happen to her but then later on the pain returned and began radiating to her shoulder blade and into the right year. Her wrist blood pressure cuff showed elevation of the blood pressure. She then got a headache and felt like the left side of her sweat face was swelling. Her headache is not severe currently.Chest pain is present but not severe  Past Medical History:  Diagnosis Date  . Abdominal pain, generalized 11/11/2014  . Altered bowel function 11/11/2014  . Bleeding per rectum 11/11/2014  . Calculus of kidney 12/14/2015  . Fatty infiltration of liver 11/11/2014  . PCOS (polycystic ovarian syndrome)   . Upper airway cough syndrome 08/17/2015   Followed in Pulmonary clinic/ [facility]/ [facility]      Patient Active Problem List   Diagnosis Date Noted  . Calculus of kidney 12/14/2015  . Bilateral polycystic ovarian syndrome 12/14/2015  . Upper airway cough syndrome 08/17/2015  . Abdominal pain, generalized 11/11/2014  . Bleeding per rectum 11/11/2014  . Altered bowel function 11/11/2014  . Fatty infiltration of liver 11/11/2014    Past Surgical History:  Procedure Laterality Date  . CHOLECYSTECTOMY      Prior to Admission medications   Medication Sig Start Date End Date Taking? Authorizing Provider  Cholecalciferol (VITAMIN D3) 2000 UNITS capsule Take by mouth daily.    Historical Provider, MD  Doxylamine-Pyridoxine (DICLEGIS PO) Take by mouth.    Historical  Provider, MD  FLUoxetine (PROZAC) 10 MG capsule Take 20 mg by mouth daily.    Historical Provider, MD  metFORMIN (GLUCOPHAGE-XR) 500 MG 24 hr tablet Take 500 mg by mouth daily.    Historical Provider, MD  Prenatal Vit-Fe Fumarate-FA (PRENATAL VITAMIN PO) Take 1 tablet by mouth daily.    Historical Provider, MD  VITAMIN C, CALCIUM ASCORBATE, PO Take by mouth.    Historical Provider, MD    Allergies Bactrim [sulfamethoxazole-trimethoprim] and Levaquin [levofloxacin in d5w]  Family History  Problem Relation Age of Onset  . Heart disease Father   . Breast cancer Maternal Grandmother   . Bladder Cancer Maternal Grandfather   . Kidney Stones Mother     Social History Social History  Substance Use Topics  . Smoking status: Never Smoker  . Smokeless tobacco: Never Used  . Alcohol use 0.0 oz/week     Comment: rarely    Review of Systems Constitutional: No fever/chills Eyes: No visual changes. ENT: No sore throat. Cardiovascular: chest pain. Respiratory: Denies shortness of breath. Gastrointestinal: No abdominal pain.  No nausea, no vomiting.  No diarrhea.  No constipation. Genitourinary: Negative for dysuria. Musculoskeletal: Negative for back pain. Skin: Negative for rash. Neurological: Negative for headaches, focal weakness or numbness.  10-point ROS otherwise negative.  ____________________________________________   PHYSICAL EXAM:  VITAL SIGNS: ED Triage Vitals  Enc Vitals Group     BP 06/19/16 2125 (!) 173/98     Pulse Rate 06/19/16 2125 86     Resp 06/19/16  2125 (!) 23     Temp 06/19/16 2125 98.4 F (36.9 C)     Temp Source 06/19/16 2125 Oral     SpO2 06/19/16 2125 96 %     Weight 06/19/16 2125 284 lb (128.8 kg)     Height 06/19/16 2125 5\' 8"  (1.727 m)     Head Circumference --      Peak Flow --      Pain Score 06/19/16 2134 4     Pain Loc --      Pain Edu? --      Excl. in GC? --     Constitutional: Alert and oriented. Well appearing and in no acute  distress. Eyes: Conjunctivae are normal. PERRL. EOMI. Head: Atraumatic. Nose: No congestion/rhinnorhea. Mouth/Throat: Mucous membranes are moist.  Oropharynx non-erythematous. Neck: No stridor.   Cardiovascular: Normal rate, regular rhythm. Grossly normal heart sounds.  Good peripheral circulation. Respiratory: Normal respiratory effort.  No retractions. Lungs CTAB. Gastrointestinal: Soft and nontender. No distention. No abdominal bruits. No CVA tenderness. Musculoskeletal: No lower extremity tenderness nor edema.  No joint effusions. Neurologic:  Normal speech and language. No gross focal neurologic deficits are appreciated. No gait instability. Skin:  Skin is warm, dry and intact. No rash noted. Psychiatric: Mood and affect are normal. Speech and behavior are normal.  ____________________________________________   LABS (all labs ordered are listed, but only abnormal results are displayed)  Labs Reviewed  COMPREHENSIVE METABOLIC PANEL - Abnormal; Notable for the following:       Result Value   CO2 18 (*)    Glucose, Bld 151 (*)    Albumin 2.9 (*)    ALT 12 (*)    Alkaline Phosphatase 188 (*)    All other components within normal limits  CBC WITH DIFFERENTIAL/PLATELET - Abnormal; Notable for the following:    HCT 34.3 (*)    Neutro Abs 6.9 (*)    All other components within normal limits  TROPONIN I  URINALYSIS COMPLETEWITH MICROSCOPIC ([facility] ONLY)   ____________________________________________  EKG  EKG read and interpreted by me shows normal sinus rhythm rate of 88 normal axis no acute ST-T wave changes ____________________________________________  RADIOLOGY   ____________________________________________   PROCEDURES    Procedures    ____________________________________________   INITIAL IMPRESSION / ASSESSMENT AND PLAN / ED COURSE  Pertinent labs & imaging results that were available during my care of the patient were reviewed by me and considered in my  medical decision making (see chart for details).    Clinical Course   Patient has edema and is hypertensive. Blood pressure is considerably well above her usual normal at 140 lungs are clear to exam I have not done a chest x-ray since she is pregnant believe she is medically stable to go upstairs and complete her evaluation. Urinalysis is pending we will try GI cocktail  ____________________________________________   FINAL CLINICAL IMPRESSION(S) / ED DIAGNOSES  Final diagnoses:  Chest pain, unspecified chest pain type      NEW MEDICATIONS STARTED DURING THIS VISIT:  New Prescriptions   No medications on file     Note:  This document was prepared using Dragon voice recognition software and may include unintentional dictation errors.    Arnaldo NatalPaul F Malinda, MD 06/19/16 2312

## 2016-06-19 NOTE — ED Notes (Signed)
Report given to floor RN. Pt taken to floor via stretcher. Vital signs stable prior to transport.  

## 2016-06-19 NOTE — ED Triage Notes (Signed)
2133:  Pt presents to ED from home c/o central and left side chest pain that started last night and again this morning. Pt states pain was relieved when pt sat up both times but was concerned about BP. Pt is [redacted] weeks pregnant and is closely watched by PCP for preeclampsia.

## 2016-06-20 ENCOUNTER — Encounter: Payer: Self-pay | Admitting: *Deleted

## 2016-06-20 DIAGNOSIS — O99343 Other mental disorders complicating pregnancy, third trimester: Secondary | ICD-10-CM | POA: Diagnosis present

## 2016-06-20 DIAGNOSIS — Z79899 Other long term (current) drug therapy: Secondary | ICD-10-CM | POA: Diagnosis not present

## 2016-06-20 DIAGNOSIS — F17210 Nicotine dependence, cigarettes, uncomplicated: Secondary | ICD-10-CM | POA: Diagnosis present

## 2016-06-20 DIAGNOSIS — K760 Fatty (change of) liver, not elsewhere classified: Secondary | ICD-10-CM | POA: Diagnosis present

## 2016-06-20 DIAGNOSIS — O1413 Severe pre-eclampsia, third trimester: Secondary | ICD-10-CM | POA: Diagnosis present

## 2016-06-20 DIAGNOSIS — D62 Acute posthemorrhagic anemia: Secondary | ICD-10-CM | POA: Diagnosis not present

## 2016-06-20 DIAGNOSIS — O26613 Liver and biliary tract disorders in pregnancy, third trimester: Secondary | ICD-10-CM | POA: Diagnosis present

## 2016-06-20 DIAGNOSIS — Z7984 Long term (current) use of oral hypoglycemic drugs: Secondary | ICD-10-CM | POA: Diagnosis not present

## 2016-06-20 DIAGNOSIS — O360930 Maternal care for other rhesus isoimmunization, third trimester, not applicable or unspecified: Secondary | ICD-10-CM | POA: Diagnosis present

## 2016-06-20 DIAGNOSIS — F429 Obsessive-compulsive disorder, unspecified: Secondary | ICD-10-CM | POA: Diagnosis present

## 2016-06-20 DIAGNOSIS — Z888 Allergy status to other drugs, medicaments and biological substances status: Secondary | ICD-10-CM | POA: Diagnosis not present

## 2016-06-20 DIAGNOSIS — O99283 Endocrine, nutritional and metabolic diseases complicating pregnancy, third trimester: Secondary | ICD-10-CM | POA: Diagnosis present

## 2016-06-20 DIAGNOSIS — Z23 Encounter for immunization: Secondary | ICD-10-CM | POA: Diagnosis not present

## 2016-06-20 DIAGNOSIS — F419 Anxiety disorder, unspecified: Secondary | ICD-10-CM | POA: Diagnosis present

## 2016-06-20 DIAGNOSIS — O99213 Obesity complicating pregnancy, third trimester: Secondary | ICD-10-CM | POA: Diagnosis present

## 2016-06-20 DIAGNOSIS — Z6841 Body Mass Index (BMI) 40.0 and over, adult: Secondary | ICD-10-CM | POA: Diagnosis not present

## 2016-06-20 DIAGNOSIS — Z3A34 34 weeks gestation of pregnancy: Secondary | ICD-10-CM | POA: Diagnosis not present

## 2016-06-20 DIAGNOSIS — R079 Chest pain, unspecified: Secondary | ICD-10-CM | POA: Diagnosis present

## 2016-06-20 DIAGNOSIS — O99333 Smoking (tobacco) complicating pregnancy, third trimester: Secondary | ICD-10-CM | POA: Diagnosis present

## 2016-06-20 LAB — COMPREHENSIVE METABOLIC PANEL
ALT: 13 U/L — ABNORMAL LOW (ref 14–54)
AST: 28 U/L (ref 15–41)
Albumin: 3.2 g/dL — ABNORMAL LOW (ref 3.5–5.0)
Alkaline Phosphatase: 209 U/L — ABNORMAL HIGH (ref 38–126)
Anion gap: 9 (ref 5–15)
BUN: 8 mg/dL (ref 6–20)
CHLORIDE: 105 mmol/L (ref 101–111)
CO2: 22 mmol/L (ref 22–32)
CREATININE: 0.6 mg/dL (ref 0.44–1.00)
Calcium: 8.6 mg/dL — ABNORMAL LOW (ref 8.9–10.3)
Glucose, Bld: 149 mg/dL — ABNORMAL HIGH (ref 65–99)
POTASSIUM: 4 mmol/L (ref 3.5–5.1)
Sodium: 136 mmol/L (ref 135–145)
Total Bilirubin: 0.3 mg/dL (ref 0.3–1.2)
Total Protein: 7.7 g/dL (ref 6.5–8.1)

## 2016-06-20 LAB — PROTEIN / CREATININE RATIO, URINE
CREATININE, URINE: 167 mg/dL
Protein Creatinine Ratio: 0.15 mg/mg{Cre} (ref 0.00–0.15)
Total Protein, Urine: 25 mg/dL

## 2016-06-20 LAB — CBC
HCT: 36.2 % (ref 35.0–47.0)
HEMOGLOBIN: 12.6 g/dL (ref 12.0–16.0)
MCH: 28.7 pg (ref 26.0–34.0)
MCHC: 34.9 g/dL (ref 32.0–36.0)
MCV: 82.2 fL (ref 80.0–100.0)
Platelets: 300 10*3/uL (ref 150–440)
RBC: 4.4 MIL/uL (ref 3.80–5.20)
RDW: 13.9 % (ref 11.5–14.5)
WBC: 12.5 10*3/uL — ABNORMAL HIGH (ref 3.6–11.0)

## 2016-06-20 LAB — TYPE AND SCREEN
ABO/RH(D): O NEG
ANTIBODY SCREEN: POSITIVE

## 2016-06-20 MED ORDER — LACTATED RINGERS IV SOLN
2.0000 g/h | INTRAVENOUS | Status: DC
  Administered 2016-06-21: 2 g/h via INTRAVENOUS
  Filled 2016-06-20 (×3): qty 80

## 2016-06-20 MED ORDER — BETAMETHASONE SOD PHOS & ACET 6 (3-3) MG/ML IJ SUSP
12.0000 mg | Freq: Every day | INTRAMUSCULAR | Status: AC
Start: 2016-06-20 — End: 2016-06-21
  Administered 2016-06-20 – 2016-06-21 (×2): 12 mg via INTRAMUSCULAR
  Filled 2016-06-20: qty 1

## 2016-06-20 MED ORDER — LIDOCAINE HCL (PF) 1 % IJ SOLN: 30.0000 mL | INTRAMUSCULAR | Status: DC | PRN

## 2016-06-20 MED ORDER — OXYTOCIN 10 UNIT/ML IJ SOLN
10.0000 [IU] | Freq: Once | INTRAMUSCULAR | Status: DC
Start: 2016-06-20 — End: 2016-06-22

## 2016-06-20 MED ORDER — HYDRALAZINE HCL 20 MG/ML IJ SOLN
10.0000 mg | Freq: Once | INTRAMUSCULAR | Status: DC | PRN
  Filled 2016-06-20: qty 1

## 2016-06-20 MED ORDER — DINOPROSTONE 10 MG VA INST
10.0000 mg | VAGINAL_INSERT | Freq: Once | VAGINAL | Status: AC
  Administered 2016-06-20: 10 mg via VAGINAL
  Filled 2016-06-20: qty 1

## 2016-06-20 MED ORDER — SOD CITRATE-CITRIC ACID 500-334 MG/5ML PO SOLN: 30.0000 mL | ORAL | Status: DC | PRN

## 2016-06-20 MED ORDER — ACETAMINOPHEN 325 MG PO TABS
650.0000 mg | ORAL_TABLET | ORAL | Status: DC | PRN
  Administered 2016-06-20 – 2016-06-22 (×3): 650 mg via ORAL
  Filled 2016-06-20 (×2): qty 2

## 2016-06-20 MED ORDER — MISOPROSTOL 200 MCG PO TABS
ORAL_TABLET | ORAL | Status: AC
  Administered 2016-06-21: 25 ug via BUCCAL
  Filled 2016-06-20: qty 4

## 2016-06-20 MED ORDER — SODIUM CHLORIDE FLUSH 0.9 % IV SOLN
INTRAVENOUS | Status: AC
  Filled 2016-06-20: qty 10

## 2016-06-20 MED ORDER — BUTORPHANOL TARTRATE 1 MG/ML IJ SOLN: 2.0000 mg | INTRAMUSCULAR | Status: DC | PRN

## 2016-06-20 MED ORDER — LABETALOL HCL 5 MG/ML IV SOLN
INTRAVENOUS | Status: AC
  Administered 2016-06-20: 20 mg
  Filled 2016-06-20: qty 4

## 2016-06-20 MED ORDER — TERBUTALINE SULFATE 1 MG/ML IJ SOLN: 0.2500 mg | Freq: Once | INTRAMUSCULAR | Status: DC | PRN

## 2016-06-20 MED ORDER — OXYTOCIN 10 UNIT/ML IJ SOLN
INTRAMUSCULAR | Status: AC
  Filled 2016-06-20: qty 2

## 2016-06-20 MED ORDER — MAGNESIUM SULFATE BOLUS VIA INFUSION
4.0000 g | Freq: Once | INTRAVENOUS | Status: AC
  Administered 2016-06-20: 4 g via INTRAVENOUS
  Filled 2016-06-20: qty 500

## 2016-06-20 MED ORDER — HYDRALAZINE HCL 20 MG/ML IJ SOLN: 10.0000 mg | Freq: Once | INTRAMUSCULAR | Status: DC | PRN

## 2016-06-20 MED ORDER — AMMONIA AROMATIC IN INHA
RESPIRATORY_TRACT | Status: AC
Start: 2016-06-20 — End: 2016-06-20
  Filled 2016-06-20: qty 10

## 2016-06-20 MED ORDER — LABETALOL HCL 5 MG/ML IV SOLN: 20.0000 mg | INTRAVENOUS | Status: DC | PRN

## 2016-06-20 MED ORDER — LACTATED RINGERS IV SOLN
INTRAVENOUS | Status: DC
  Administered 2016-06-20: 125 mL/h via INTRAVENOUS
  Administered 2016-06-20: 100 mL/h via INTRAVENOUS
  Administered 2016-06-20 – 2016-06-22 (×2): via INTRAVENOUS

## 2016-06-20 MED ORDER — LACTATED RINGERS IV SOLN: 500.0000 mL | INTRAVENOUS | Status: DC | PRN

## 2016-06-20 MED ORDER — SODIUM CHLORIDE 0.9 % IV SOLN
2.0000 g | Freq: Once | INTRAVENOUS | Status: AC
  Administered 2016-06-21: 2 g via INTRAVENOUS
  Filled 2016-06-20: qty 2000

## 2016-06-20 MED ORDER — LIDOCAINE HCL (PF) 1 % IJ SOLN
INTRAMUSCULAR | Status: AC
  Filled 2016-06-20: qty 30

## 2016-06-20 MED ORDER — OXYTOCIN 40 UNITS IN LACTATED RINGERS INFUSION - SIMPLE MED
2.5000 [IU]/h | INTRAVENOUS | Status: DC
  Filled 2016-06-20 (×3): qty 1000

## 2016-06-20 MED ORDER — LABETALOL HCL 5 MG/ML IV SOLN
20.0000 mg | INTRAVENOUS | Status: AC | PRN
  Administered 2016-06-20: 40 mg via INTRAVENOUS
  Administered 2016-06-20: 80 mg via INTRAVENOUS
  Administered 2016-06-21: 20 mg via INTRAVENOUS
  Filled 2016-06-20: qty 8
  Filled 2016-06-20: qty 4
  Filled 2016-06-20 (×2): qty 16

## 2016-06-20 MED ORDER — SODIUM CHLORIDE 0.9 % IV SOLN
1.0000 g | INTRAVENOUS | Status: DC
  Administered 2016-06-21 – 2016-06-22 (×5): 1 g via INTRAVENOUS
  Filled 2016-06-20 (×6): qty 1000

## 2016-06-20 MED ORDER — ONDANSETRON HCL 4 MG/2ML IJ SOLN
4.0000 mg | Freq: Four times a day (QID) | INTRAMUSCULAR | Status: DC | PRN
  Administered 2016-06-21: 4 mg via INTRAVENOUS

## 2016-06-20 MED ORDER — OXYTOCIN BOLUS FROM INFUSION: 500.0000 mL | Freq: Once | INTRAVENOUS | Status: DC

## 2016-06-20 MED ORDER — ACETAMINOPHEN 325 MG PO TABS
ORAL_TABLET | ORAL | Status: AC
  Administered 2016-06-20: 650 mg via ORAL
  Filled 2016-06-20: qty 2

## 2016-06-20 MED ORDER — OXYTOCIN 40 UNITS IN LACTATED RINGERS INFUSION - SIMPLE MED
1.0000 m[IU]/min | INTRAVENOUS | Status: DC
  Administered 2016-06-20: 1 m[IU]/min via INTRAVENOUS
  Administered 2016-06-21: 24 m[IU]/min via INTRAVENOUS
  Administered 2016-06-21: 22 m[IU]/min via INTRAVENOUS
  Administered 2016-06-22: 1 m[IU]/min via INTRAVENOUS

## 2016-06-20 MED ORDER — FLUOXETINE HCL 20 MG PO CAPS
20.0000 mg | ORAL_CAPSULE | Freq: Every day | ORAL | Status: DC
  Administered 2016-06-20 – 2016-06-22 (×2): 20 mg via ORAL
  Filled 2016-06-20 (×3): qty 1

## 2016-06-20 MED ORDER — CALCIUM GLUCONATE 10 % IV SOLN
INTRAVENOUS | Status: AC
  Filled 2016-06-20: qty 10

## 2016-06-20 NOTE — Progress Notes (Signed)
Nursing Supervisor aware that assistance has not been able to come from ED for IV restart, Agreeable to CRNA being reached out to

## 2016-06-20 NOTE — Progress Notes (Signed)
Nursing Supervisor contacted in order to establish new IV site, states she is unable to come, but will have RN for ED come to assist as soon as possible. Plan of care shared with pt, fob

## 2016-06-20 NOTE — Progress Notes (Signed)
Dr Jean Rosenthaljackson aware that IV site presently in R antecubital fossa , infusing poorly- for IV restart. Dr Jean RosenthalJackson stating that after IV restart, continue administration of antihypertensives as needed, and start MagSO4 on new line

## 2016-06-20 NOTE — Progress Notes (Signed)
Pt transferred to LDR % via W/C with FOB, and belongings

## 2016-06-20 NOTE — Progress Notes (Signed)
Pt medicated with further dose of Labetalol- as per proto cal, using present IV site, slowly. Awaiting for assistance from ED for reinsertion of second IV site. Dr Jean RosenthalJackson aware of continued delay, and that last dose Labetalol given in old IV site. Pt tolerating medication well, site continues to occlude easily;y

## 2016-06-20 NOTE — H&P (Addendum)
OB History & Physical   History of Present Illness:  Chief Complaint: chest pain  HPI:  Melissa Martinez is a 30 y.o. G2P0010 female at 571w4d dated by 7 week ultrasound.  Her pregnancy has been complicated by Rh negative status, Obesity with initial BMI 39 (34 pound weight gain), Depression/anxiety, history of fatty liver, possible chronic hypertension.    She denies contractions.   She denies leakage of fluid.   She denies vaginal bleeding.   She reports fetal movement.    She was having left sided chest pain that started two days ago, radiated through to her back, down her left arm, to her left jaw. She tried to measured her blood pressure at home and could not get what she considered to be an accurate reading.  So, she went to the fire department in [place] EMS came to check her blood pressure and perform a 12-lead EKG.  Her blood pressures there were in the 190s/110s.  She presented to the ER yesterday evening where she had a further cardiac workup, which was negative.  She had persistently elevated blood pressures. So, she was sent up to labor and delivery for further assessment and treatment.  She has a mild left-sided headache currently. Denies visual changes and RUQ pain.    Maternal Medical History:   Past Medical History:  Diagnosis Date  . Abdominal pain, generalized 11/11/2014  . Altered bowel function 11/11/2014  . Anxiety    tkes Prozac, for OCD related anxiety  . Bleeding per rectum 11/11/2014  . Calculus of kidney 12/14/2015  . Fatty infiltration of liver 11/11/2014  . PCOS (polycystic ovarian syndrome)   . Upper airway cough syndrome 08/17/2015   Followed in Pulmonary clinic/ [facility]/ [facility]      Past Surgical History:  Procedure Laterality Date  . CHOLECYSTECTOMY    . COLONOSCOPY  09/2014  . LAPAROSCOPIC CHOLECYSTECTOMY  06/2009  . STENT PLACE LEFT URETER ([facility] HX) Left 11/2012   has had 2 stones pass stones    Allergies  Allergen Reactions  . Bactrim  [Sulfamethoxazole-Trimethoprim] Itching    Also redness  . Levaquin [Levofloxacin In D5w] Other (See Comments)    Throat tightening, generalized itching, redness    Prior to Admission medications   Medication Sig Start Date End Date Taking? Authorizing Provider  Cholecalciferol (VITAMIN D3) 2000 UNITS capsule Take 2,000 Units by mouth daily. Pt states she takes 4000 iu daily   Yes Historical Provider, MD  FLUoxetine (PROZAC) 10 MG capsule Take 20 mg by mouth daily.   Yes Historical Provider, MD  folic acid (FOLVITE) 1 MG tablet Take 1 mg by mouth daily.   Yes Historical Provider, MD  metFORMIN (GLUCOPHAGE-XR) 500 MG 24 hr tablet Take 500 mg by mouth daily.   Yes Historical Provider, MD  Prenatal Vit-Fe Fumarate-FA (PRENATAL VITAMIN PO) Take 1 tablet by mouth daily.   Yes Historical Provider, MD  Doxylamine-Pyridoxine (DICLEGIS PO) Take by mouth.    Historical Provider, MD  VITAMIN C, CALCIUM ASCORBATE, PO Take by mouth.    Historical Provider, MD    OB History  Gravida Para Term Preterm AB Living  2       1    SAB TAB Ectopic Multiple Live Births  1            # Outcome Date GA Lbr Len/2nd Weight Sex Delivery Anes PTL Lv  2 Current           1 SAB  Obstetric Comments  Spontaneous miscarriage at 8 weeks, 02/2012    Prenatal care site: [facility]  Social History: She  reports that she has been smoking.  She has been smoking about 0.00 packs per day. She has never used smokeless tobacco. She reports that she does not drink alcohol or use drugs.  Family History: family history includes Bladder Cancer in her maternal grandfather; Breast cancer in her maternal grandmother; Diabetes in her maternal grandmother; Heart disease in her father and maternal grandfather; Hyperlipidemia in her father; Hypertension in her brother and mother; Kidney Stones in her mother.   Review of Systems: Negative x 10 systems reviewed except as noted in the HPI.    Physical Exam:  Vital  Signs: BP (!) 171/95   Pulse 86   Temp 98 F (36.7 C) (Oral)   Resp 18   Ht  (1.727 m)   Wt 128.8 kg (284 lb)   LMP 10/13/2015 (Exact Date)   SpO2 98%   BMI 43.18 kg/m  General: no acute distress.  HEENT: normocephalic, atraumatic Heart: regular rate & rhythm.  No murmurs/rubs/gallops Lungs: clear to auscultation bilaterally Abdomen: soft, gravid, non-tender;  EFW: 2200 grams Pelvic: deferred for now.  Extremities: non-tender, symmetric, 1+ edema bilaterally.  DTRs: 1+  Neurologic: Alert & oriented x 3.    Pertinent Results:  Prenatal Labs: Blood type/Rh O negative  Antibody screen negative  Rubella Immune  Varicella Immune    RPR NR  HBsAg negative  HIV negative  GC negative  Chlamydia negative  Genetic screening Negative first trimester screen  1 hour GTT Failed early 1 hour gtt  3 hour GTT Passed 1/4 abnormal 92/175/163/129  GBS unknown   Lab Results  Component Value Date   WBC 10.3 06/19/2016   HGB 12.1 06/19/2016   HCT 34.3 (L) 06/19/2016   PLT 281 06/19/2016   CREATININE 0.62 06/19/2016   AST 19 06/19/2016   ALT 12 (L) 06/19/2016   PROTCRRATIO 0.15 06/19/2016    Baseline FHR: 140 beats/min   Variability: moderate   Accelerations: present   Decelerations: absent Contractions: absent frequency: n/a Overall assessment: category 1  Bedside Ultrasound:  Number of Fetus: 1  Presentation: cephalic  Fluid: grossly normal  Placental Location: posterior  Assessment:  Melissa Martinez is a 30 y.o. G53P0010 female at [redacted]w[redacted]d with severe gestational hypertension.  She had some blood pressures in her initial prenatal visits that were elevated. She denies normally having any blood pressures that are elevated.  So, the distinction between severe gestational hypertension and superimposed preeclampsia is not completely clear.  It has taken several doses of labetalol so far without significant improvement in her blood pressure control. So, there may not be any clinically  significant difference.  Recent growth ultrasound shows 33rd %ile with normal AFI.   Chest pain: likely etiology is related to her blood pressure and pregnancy.  Concerning etiologies assessed in the ER. Her chest pain is improved here. Will continue to monitor.   Plan:  1. Admit to Labor & Delivery  2. Severe gestational hypertension:  1. Start magnesium sulfate  4 gram load, 2 gram/hour rate 2. Betamethasone  IM x 2 doses 24 hours apart 3. Antihypertensives per protocol to get blood pressures in the acceptable range. 4. Given gestational age, will begin induction of labor at this point.  Though I am not delaying induction to administer betamethasone, I will administer the steroid given that it will take a while to affect delivery.  5. SCN aware 3. GBS Unknown: collect at admission.   4. Fetwal well-being: reassuring at this ponit.    Conard Novak, MD 06/20/2016 3:18 AM

## 2016-06-20 NOTE — Progress Notes (Signed)
CRNA present for IV restart

## 2016-06-20 NOTE — Plan of Care (Signed)
Discussed with Transition nurse having Dr. Cleatis PolkaAuten, Neo MD, to come and speak to parents about the care their baby will be receiving. Melissa Martinez RNC

## 2016-06-20 NOTE — Progress Notes (Signed)
Dr Jean RosenthalJackson clarifying Antihypertensive medication orders. Pt to receive Labetalol medication prior to Hydralazine if needed. Dr Jean RosenthalJackson to place fluid order

## 2016-06-20 NOTE — OB Triage Note (Signed)
Passed cardiology testing in ER. Received a gi cocktail late in evening while in ER. States pain the same but now about a 2/10

## 2016-06-20 NOTE — Progress Notes (Signed)
Emptied 1650cc urine from foley bag

## 2016-06-20 NOTE — Plan of Care (Signed)
Pt assisted up to BR for bowel movement. Tolerated well.

## 2016-06-20 NOTE — Plan of Care (Signed)
Dr. Cleatis PolkaAuten, Neo MD, in to discuss plan of care with parents about 34 week delivery.  Patient and husband asking appropriate questions.  Ellison Carwin Farron Lafond RNC

## 2016-06-20 NOTE — Progress Notes (Signed)
L&D Note  06/20/2016 - 8:50 AM  30 y.o. G2P0010 9855w4d   [name] is admitted for IOL for severe GHTN vs superimposed pre-e   Subjective:  Comfortable resting in bed. No cramping since Cervidil placed about 2 hours ago.  Objective:   Vitals:   06/20/16 0540 06/20/16 0623 06/20/16 0632 06/20/16 0700  BP: (!) 146/72 128/67 (!) 143/79   Pulse: 79 95 87   Resp:   20   Temp:    97.9 F (36.6 C)  TempSrc:    Oral  SpO2:      Weight:      Height:        Current Vital Signs 24h Vital Sign Ranges  T 97.9 F (36.6 C) Temp  Avg: 98.1 F (36.7 C)  Min: 97.9 F (36.6 C)  Max: 98.4 F (36.9 C)  BP (!) 143/79 BP  Min: 128/67  Max: 175/100  HR 87 Pulse  Avg: 87  Min: 79  Max: 95  RR 20 Resp  Avg: 19.7  Min: 18  Max: 23  SaO2 99 % Not Delivered SpO2  Avg: 98 %  Min: 93 %  Max: 99 %       24 Hour I/O Current Shift I/O  Time Ins Outs 08/06 0701 - 08/07 0700 In: 102.1 [I.V.:102.1] Out: -  08/07 0701 - 08/07 1900 In: 125 [I.V.:125] Out: 350 [Urine:350]    FHR: cat 1  Toco: none Lungs: CTAB Heart: RRR   Assessment :  IUP at 8255w4d, IOL for severe GHTN vs superimposed pre-e  Plan:  Continue current plan for IOL Antibiotics ordered for when in active labor SCDs  Marta AntuBrothers, Teren Zurcher, [place]

## 2016-06-20 NOTE — Progress Notes (Signed)
L&D Progress Note Patient admitted at 34.4 weeks with severe range blood pressures this Am. Normal labs. Received one dose of BMZ. On magnesium and ampicillin for GBS PPX.  S: Having facial/sinus type pain, no SOB or CP. Feeling contractions  O: 134-153/ 61-79 General: in NAD FHR: 130 with accelerations to 150s to 160s, moderate variability, no decelerations Toco: q4-5 min apart Cervidil removed Cervix: extremely posterior?closed/50%/-1/med Heart: RRR without murmur Lungs: CTA Ext: SCDs on Urine output: 100-62950ml/hour  A: IUP at 34.4 weeks undergoing IOL for preeclampsia with severe features  (based on severe range blood pressures): now with elevated systolics in the mild range Good urine output Cat1 tracing  P: Plan Pitocin Can eat regular food for supper Continue hourly blood pressure and urine output checks Repeat PIH labs tonight at 2100  Farrel ConnersGUTIERREZ, Melissa Martinez, CNM

## 2016-06-21 LAB — RPR: RPR: NONREACTIVE

## 2016-06-21 MED ORDER — LABETALOL HCL 5 MG/ML IV SOLN
20.0000 mg | INTRAVENOUS | Status: DC | PRN
  Administered 2016-06-21: 20 mg via INTRAVENOUS
  Filled 2016-06-21: qty 16

## 2016-06-21 MED ORDER — ONDANSETRON HCL 4 MG/2ML IJ SOLN
INTRAMUSCULAR | Status: AC
  Administered 2016-06-21: 4 mg via INTRAVENOUS
  Filled 2016-06-21: qty 2

## 2016-06-21 MED ORDER — HYDRALAZINE HCL 20 MG/ML IJ SOLN: 10.0000 mg | Freq: Once | INTRAMUSCULAR | Status: DC | PRN

## 2016-06-21 MED ORDER — BUTORPHANOL TARTRATE 1 MG/ML IJ SOLN
INTRAMUSCULAR | Status: AC
  Filled 2016-06-21: qty 2

## 2016-06-21 MED ORDER — MISOPROSTOL 25 MCG QUARTER TABLET
25.0000 ug | ORAL_TABLET | ORAL | Status: DC | PRN
  Administered 2016-06-21 – 2016-06-22 (×5): 25 ug via BUCCAL
  Filled 2016-06-21 (×6): qty 0.25

## 2016-06-21 MED ORDER — SODIUM CHLORIDE FLUSH 0.9 % IV SOLN
INTRAVENOUS | Status: AC
  Administered 2016-06-21: 10 mL
  Filled 2016-06-21: qty 40

## 2016-06-21 MED ORDER — SODIUM CHLORIDE FLUSH 0.9 % IV SOLN
INTRAVENOUS | Status: AC
  Administered 2016-06-21: 10 mL
  Filled 2016-06-21: qty 10

## 2016-06-21 MED ORDER — SODIUM CHLORIDE FLUSH 0.9 % IV SOLN
INTRAVENOUS | Status: AC
  Administered 2016-06-21: 10 mL via INTRAVENOUS
  Filled 2016-06-21: qty 20

## 2016-06-21 MED ORDER — SODIUM CHLORIDE FLUSH 0.9 % IV SOLN
INTRAVENOUS | Status: AC
  Administered 2016-06-21: 10 mL
  Filled 2016-06-21: qty 20

## 2016-06-21 MED ORDER — ONDANSETRON HCL 4 MG/2ML IJ SOLN
INTRAMUSCULAR | Status: AC
  Administered 2016-06-21: 4 mg
  Filled 2016-06-21: qty 2

## 2016-06-21 MED ORDER — ONDANSETRON HCL 4 MG/2ML IJ SOLN: 4.0000 mg | Freq: Once | INTRAMUSCULAR | Status: AC

## 2016-06-21 MED ORDER — SODIUM CHLORIDE FLUSH 0.9 % IV SOLN
INTRAVENOUS | Status: AC
  Filled 2016-06-21: qty 20

## 2016-06-21 MED ORDER — LABETALOL HCL 5 MG/ML IV SOLN
INTRAVENOUS | Status: AC
  Administered 2016-06-21: 20 mg via INTRAVENOUS
  Filled 2016-06-21: qty 4

## 2016-06-21 NOTE — Progress Notes (Signed)
Provider, Sharen HonesGutierrez CNM called to the bedside to assess patient..the patient continues to c/o of heaviness in chest, feeling light headed and "out of it".  Side effects of pain medication discussed with patient prior to administration.  See VS for BP.

## 2016-06-21 NOTE — Progress Notes (Signed)
Patient c/o of headache and "heaviness" in chest area, while administering Labetalol, 20 mg IV for BP of 166/97. Patient also asking for pain medication for contractions. Labetalol given via upper right arm (NS flush before and after medication). Stadol given 2 mg via hand IV, NS flush before and after medication.

## 2016-06-21 NOTE — Progress Notes (Signed)
L&D Progress Note  S: Contractions feeling stronger. Was able to sleep a little during the night. Received her second dose of BMZ this AM. Has not been started on GBS PPX   O: BP (!) 148/78   Pulse 84   Temp 98.8 F (37.1 C) (Oral)   Resp 18   Ht 5\' 8"  (1.727 m)   Wt 128.8 kg (284 lb)   LMP 10/13/2015 (Exact Date)   SpO2 100%   BMI 43.18 kg/m   Range: 131/70-158/84 Urine output 90-220 ml/hr Had 2 BMs tonight  FHR: 130s with accelerations to 160s, moderate variability Toco: Contractions q2-4 min apart on 20 miu/min Pitocin, palpate firm  Cervix: 1/70%/-1/ not quite as posterior/ medium Tight outlet  A: IOL for preeclampsia with severe features (due to blood pressures)-some progress on Pitocin Mild range blood pressures Good urine output  P: Continue present plan of management Consider foley bulb when cervix a little more anterior  Melissa Martinez, CNM

## 2016-06-21 NOTE — Progress Notes (Signed)
Patient ID: Melissa Martinez, female   DOB: [birth year], 30 y.o.   MRN: [id number] Labor Check  Subj:  Complaints: no complaints. No headaches, no trouble breathing.    Obj:  BP 137/82 (BP Location: Left Arm)   Pulse 66   Temp 98.1 F (36.7 C) (Oral)   Resp 17   Ht 5\' 8"  (1.727 m)   Wt 128.8 kg (284 lb)   LMP 10/13/2015 (Exact Date)   SpO2 100%   BMI 43.18 kg/m  Dose (milli-units/min) Oxytocin: 24 milli-units/min  Cervix: Dilation:  (unable to reach - CNM ) / Effacement (%): 70 / Station: -1  Baseline FHR: 130    Variability: moderate    Accelerations: absent    Decelerations: absent Contractions: not tracing, infrequent at most  A/P: 30 y.o. G2P0010 female at 3544w5d with superimposed preeclampsia, severe.  1.  Labor: attempted foley placement for cervical ripening.  Will attempt misoprostol next. Patient ok with plan.  2.  FWB: reassuring given magnesium exposure, Overall assessment: category 1  3.  GBS unknown, receiving abx until culture returns  4.  Pain: prn 5.  Recheck: prn   Thomasene MohairStephen Va Broadwell, MD 06/21/2016 4:19 PM

## 2016-06-21 NOTE — Progress Notes (Signed)
Pt. Resting with mother at the bedside..fanning her.  Pt. C/o of being hot., room temp reset at 60 F, BP 148/87 with pulse 60s-70s. Reflexes 3+, no clonus, 2+ edema; UOP 350 ml for past 2 hrs. Zofran given for pt. feeling nauseous, dry.   Pt. denies headache or chest discomfort at this time. Pitocin remains at 20 mu/min; Mag. Sulfate infusing at 2 gm/hr, LR at 70 ml/hr. O2 sats at 94%-100%. RN remains at the bedside.

## 2016-06-21 NOTE — Progress Notes (Signed)
Pt. Had a fall May 2011, Left hip injured, due to over compensation, right hip is weak.  During pushing - please be aware. No history of hip replacement.

## 2016-06-22 ENCOUNTER — Encounter
Admission: EM | Disposition: A | Discharge status: Home / Self Care | Payer: Self-pay | Source: Home / Self Care | Attending: Obstetrics & Gynecology

## 2016-06-22 ENCOUNTER — Inpatient Hospital Stay: Payer: BLUE CROSS/BLUE SHIELD | Admitting: Anesthesiology

## 2016-06-22 ENCOUNTER — Encounter: Payer: Self-pay | Admitting: Anesthesiology

## 2016-06-22 DIAGNOSIS — O1490 Unspecified pre-eclampsia, unspecified trimester: Secondary | ICD-10-CM | POA: Diagnosis present

## 2016-06-22 LAB — CBC WITH DIFFERENTIAL/PLATELET
BASOS ABS: 0 10*3/uL (ref 0–0.1)
Basophils Relative: 0 %
Eosinophils Absolute: 0 10*3/uL (ref 0–0.7)
Eosinophils Relative: 0 %
HEMATOCRIT: 33.9 % — AB (ref 35.0–47.0)
HEMOGLOBIN: 11.3 g/dL — AB (ref 12.0–16.0)
LYMPHS PCT: 19 %
Lymphs Abs: 2.4 10*3/uL (ref 1.0–3.6)
MCH: 28.3 pg (ref 26.0–34.0)
MCHC: 33.4 g/dL (ref 32.0–36.0)
MCV: 84.7 fL (ref 80.0–100.0)
MONO ABS: 0.9 10*3/uL (ref 0.2–0.9)
Monocytes Relative: 7 %
NEUTROS ABS: 9.1 10*3/uL — AB (ref 1.4–6.5)
Neutrophils Relative %: 74 %
PLATELETS: 284 10*3/uL (ref 150–440)
RBC: 4 MIL/uL (ref 3.80–5.20)
RDW: 14.2 % (ref 11.5–14.5)
WBC: 12.5 10*3/uL — ABNORMAL HIGH (ref 3.6–11.0)

## 2016-06-22 LAB — COMPREHENSIVE METABOLIC PANEL
ALBUMIN: 2.9 g/dL — AB (ref 3.5–5.0)
ALK PHOS: 175 U/L — AB (ref 38–126)
ALT: 12 U/L — AB (ref 14–54)
AST: 21 U/L (ref 15–41)
Anion gap: 9 (ref 5–15)
BUN: 8 mg/dL (ref 6–20)
CALCIUM: 8.1 mg/dL — AB (ref 8.9–10.3)
CO2: 23 mmol/L (ref 22–32)
CREATININE: 0.64 mg/dL (ref 0.44–1.00)
Chloride: 105 mmol/L (ref 101–111)
GFR calc Af Amer: >60 mL/min (ref >60)
GFR calc non Af Amer: >60 mL/min (ref >60)
GLUCOSE: 129 mg/dL — AB (ref 65–99)
Potassium: 3.3 mmol/L — ABNORMAL LOW (ref 3.5–5.1)
Sodium: 137 mmol/L (ref 135–145)
Total Bilirubin: 0.1 mg/dL — ABNORMAL LOW (ref 0.3–1.2)
Total Protein: 6.9 g/dL (ref 6.5–8.1)

## 2016-06-22 LAB — PROTEIN / CREATININE RATIO, URINE
Creatinine, Urine: 36 mg/dL
PROTEIN CREATININE RATIO: 0.42 mg/mg{creat} — AB (ref 0.00–0.15)
Total Protein, Urine: 15 mg/dL

## 2016-06-22 LAB — CULTURE, BETA STREP (GROUP B ONLY)

## 2016-06-22 LAB — MAGNESIUM: MAGNESIUM: 4 mg/dL — AB (ref 1.7–2.4)

## 2016-06-22 LAB — PLATELET COUNT: PLATELETS: 268 10*3/uL (ref 150–440)

## 2016-06-22 SURGERY — Surgical Case
Anesthesia: Spinal | Site: Abdomen | Wound class: Clean Contaminated

## 2016-06-22 MED ORDER — OXYCODONE-ACETAMINOPHEN 5-325 MG PO TABS
1.0000 | ORAL_TABLET | ORAL | Status: DC | PRN
  Administered 2016-06-24 – 2016-06-26 (×3): 1 via ORAL
  Filled 2016-06-22 (×3): qty 1

## 2016-06-22 MED ORDER — MENTHOL 3 MG MT LOZG: 1.0000 | LOZENGE | OROMUCOSAL | Status: DC | PRN

## 2016-06-22 MED ORDER — ACETAMINOPHEN 325 MG PO TABS: 650.0000 mg | ORAL_TABLET | ORAL | Status: DC | PRN

## 2016-06-22 MED ORDER — MIDAZOLAM HCL 2 MG/2ML IJ SOLN
INTRAMUSCULAR | Status: DC | PRN
  Administered 2016-06-22: 2 mg via INTRAVENOUS

## 2016-06-22 MED ORDER — OXYCODONE-ACETAMINOPHEN 5-325 MG PO TABS
2.0000 | ORAL_TABLET | ORAL | Status: DC | PRN
  Administered 2016-06-23 – 2016-06-26 (×12): 2 via ORAL
  Filled 2016-06-22 (×12): qty 2

## 2016-06-22 MED ORDER — SODIUM CHLORIDE FLUSH 0.9 % IV SOLN
INTRAVENOUS | Status: AC
  Filled 2016-06-22: qty 30

## 2016-06-22 MED ORDER — OXYTOCIN 40 UNITS IN LACTATED RINGERS INFUSION - SIMPLE MED: 2.5000 [IU]/h | INTRAVENOUS | Status: DC

## 2016-06-22 MED ORDER — SOD CITRATE-CITRIC ACID 500-334 MG/5ML PO SOLN
ORAL | Status: AC
  Administered 2016-06-22: 30 mL
  Filled 2016-06-22: qty 15

## 2016-06-22 MED ORDER — KETOROLAC TROMETHAMINE 30 MG/ML IJ SOLN
INTRAMUSCULAR | Status: AC
  Administered 2016-06-22: 30 mg via INTRAVENOUS
  Filled 2016-06-22: qty 1

## 2016-06-22 MED ORDER — CEFOXITIN SODIUM-DEXTROSE 2-2.2 GM-% IV SOLR (PREMIX)
INTRAVENOUS | Status: AC
  Filled 2016-06-22: qty 50

## 2016-06-22 MED ORDER — SOD CITRATE-CITRIC ACID 500-334 MG/5ML PO SOLN
30.0000 mL | Freq: Once | ORAL | Status: DC
  Filled 2016-06-22: qty 15

## 2016-06-22 MED ORDER — SIMETHICONE 80 MG PO CHEW
80.0000 mg | CHEWABLE_TABLET | Freq: Three times a day (TID) | ORAL | Status: DC
  Administered 2016-06-24 – 2016-06-26 (×8): 80 mg via ORAL
  Filled 2016-06-22 (×9): qty 1

## 2016-06-22 MED ORDER — CEFOXITIN SODIUM-DEXTROSE 2-2.2 GM-% IV SOLR (PREMIX)
2.0000 g | INTRAVENOUS | Status: AC
  Administered 2016-06-22: 2 g via INTRAVENOUS

## 2016-06-22 MED ORDER — BUPIVACAINE HCL 0.5 % IJ SOLN
10.0000 mL | Freq: Once | INTRAMUSCULAR | Status: DC
  Filled 2016-06-22 (×2): qty 10

## 2016-06-22 MED ORDER — DIBUCAINE 1 % RE OINT: 1.0000 "application " | TOPICAL_OINTMENT | RECTAL | Status: DC | PRN

## 2016-06-22 MED ORDER — ZOLPIDEM TARTRATE 5 MG PO TABS: 5.0000 mg | ORAL_TABLET | Freq: Every evening | ORAL | Status: DC | PRN

## 2016-06-22 MED ORDER — SIMETHICONE 80 MG PO CHEW
80.0000 mg | CHEWABLE_TABLET | ORAL | Status: DC | PRN
  Administered 2016-06-24: 80 mg via ORAL

## 2016-06-22 MED ORDER — BUPIVACAINE HCL 0.5 % IJ SOLN
INTRAMUSCULAR | Status: DC | PRN
  Administered 2016-06-22 (×2): 10 mL

## 2016-06-22 MED ORDER — MAGNESIUM SULFATE 50 % IJ SOLN
INTRAVENOUS | Status: AC
  Filled 2016-06-22: qty 80

## 2016-06-22 MED ORDER — FENTANYL CITRATE (PF) 100 MCG/2ML IJ SOLN
INTRAMUSCULAR | Status: DC | PRN
  Administered 2016-06-22: 100 ug via INTRAVENOUS
  Administered 2016-06-22: 50 ug via INTRAVENOUS

## 2016-06-22 MED ORDER — LACTATED RINGERS IV SOLN
INTRAVENOUS | Status: DC
  Administered 2016-06-23: 20:00:00 via INTRAVENOUS

## 2016-06-22 MED ORDER — WITCH HAZEL-GLYCERIN EX PADS: 1.0000 "application " | MEDICATED_PAD | CUTANEOUS | Status: DC | PRN

## 2016-06-22 MED ORDER — LACTATED RINGERS IV SOLN
INTRAVENOUS | Status: DC | PRN
  Administered 2016-06-22: 22:00:00 via INTRAVENOUS

## 2016-06-22 MED ORDER — SOD CITRATE-CITRIC ACID 500-334 MG/5ML PO SOLN
30.0000 mL | ORAL | Status: AC
  Administered 2016-06-22: 30 mL via ORAL

## 2016-06-22 MED ORDER — ONDANSETRON HCL 4 MG/2ML IJ SOLN
INTRAMUSCULAR | Status: DC | PRN
  Administered 2016-06-22: 4 mg via INTRAVENOUS

## 2016-06-22 MED ORDER — SODIUM CHLORIDE FLUSH 0.9 % IV SOLN
INTRAVENOUS | Status: AC
  Administered 2016-06-22: 5 mL
  Administered 2016-06-22: 10 mL
  Filled 2016-06-22: qty 40

## 2016-06-22 MED ORDER — BUPIVACAINE 0.25 % ON-Q PUMP DUAL CATH 400 ML
400.0000 mL | INJECTION | Status: DC
  Filled 2016-06-22: qty 400

## 2016-06-22 MED ORDER — SUCCINYLCHOLINE CHLORIDE 20 MG/ML IJ SOLN
INTRAMUSCULAR | Status: DC | PRN
  Administered 2016-06-22: 100 mg via INTRAVENOUS

## 2016-06-22 MED ORDER — SODIUM CHLORIDE 0.9 % IV SOLN
1.0000 g | INTRAVENOUS | Status: DC
  Administered 2016-06-22 (×2): 1 g via INTRAVENOUS
  Filled 2016-06-22 (×2): qty 1000

## 2016-06-22 MED ORDER — KETOROLAC TROMETHAMINE 30 MG/ML IJ SOLN
30.0000 mg | Freq: Four times a day (QID) | INTRAMUSCULAR | Status: AC
  Administered 2016-06-22 – 2016-06-23 (×4): 30 mg via INTRAVENOUS
  Filled 2016-06-22 (×3): qty 1

## 2016-06-22 MED ORDER — OXYTOCIN 40 UNITS IN LACTATED RINGERS INFUSION - SIMPLE MED
INTRAVENOUS | Status: DC | PRN
  Administered 2016-06-22: 399 mL via INTRAVENOUS
  Administered 2016-06-22: 1 mL via INTRAVENOUS

## 2016-06-22 MED ORDER — PROPOFOL 10 MG/ML IV BOLUS
INTRAVENOUS | Status: DC | PRN
  Administered 2016-06-22: 200 mg via INTRAVENOUS

## 2016-06-22 MED ORDER — SENNOSIDES-DOCUSATE SODIUM 8.6-50 MG PO TABS
2.0000 | ORAL_TABLET | ORAL | Status: DC
Start: 2016-06-23 — End: 2016-06-26
  Administered 2016-06-23 – 2016-06-25 (×3): 2 via ORAL
  Filled 2016-06-22 (×3): qty 2

## 2016-06-22 MED ORDER — BUPIVACAINE HCL (PF) 0.5 % IJ SOLN
INTRAMUSCULAR | Status: AC
  Filled 2016-06-22: qty 30

## 2016-06-22 MED ORDER — DIPHENHYDRAMINE HCL 25 MG PO CAPS
25.0000 mg | ORAL_CAPSULE | Freq: Four times a day (QID) | ORAL | Status: DC | PRN
  Administered 2016-06-23 (×4): 25 mg via ORAL
  Filled 2016-06-22 (×4): qty 1

## 2016-06-22 MED ORDER — MORPHINE SULFATE (PF) 2 MG/ML IV SOLN
1.0000 mg | INTRAVENOUS | Status: DC | PRN
  Administered 2016-06-23: 1 mg via INTRAVENOUS

## 2016-06-22 MED ORDER — PRENATAL MULTIVITAMIN CH
1.0000 | ORAL_TABLET | Freq: Every day | ORAL | Status: DC
Start: 2016-06-23 — End: 2016-06-26
  Administered 2016-06-24 – 2016-06-26 (×3): 1 via ORAL
  Filled 2016-06-22 (×3): qty 1

## 2016-06-22 MED ORDER — COCONUT OIL OIL
1.0000 "application " | TOPICAL_OIL | Status: DC | PRN
  Filled 2016-06-22 (×2): qty 120

## 2016-06-22 MED ORDER — SIMETHICONE 80 MG PO CHEW
80.0000 mg | CHEWABLE_TABLET | ORAL | Status: DC
Start: 2016-06-23 — End: 2016-06-26
  Administered 2016-06-23 – 2016-06-25 (×3): 80 mg via ORAL
  Filled 2016-06-22 (×4): qty 1

## 2016-06-22 SURGICAL SUPPLY — 23 items
CANISTER SUCT 3000ML (MISCELLANEOUS) ×2 IMPLANT
CATH KIT ON-Q SILVERSOAK 5IN (CATHETERS) ×4 IMPLANT
CHLORAPREP W/TINT 26ML (MISCELLANEOUS) ×4 IMPLANT
ELECT CAUTERY BLADE 6.4 (BLADE) ×2 IMPLANT
ELECT REM PT RETURN 9FT ADLT (ELECTROSURGICAL) ×2
ELECTRODE REM PT RTRN 9FT ADLT (ELECTROSURGICAL) ×1 IMPLANT
GLOVE SKINSENSE NS SZ8.0 LF (GLOVE) ×8
GLOVE SKINSENSE STRL SZ8.0 LF (GLOVE) ×8 IMPLANT
GOWN STRL REUS W/ TWL LRG LVL3 (GOWN DISPOSABLE) ×1 IMPLANT
GOWN STRL REUS W/ TWL XL LVL3 (GOWN DISPOSABLE) ×2 IMPLANT
GOWN STRL REUS W/TWL LRG LVL3 (GOWN DISPOSABLE) ×1
GOWN STRL REUS W/TWL XL LVL3 (GOWN DISPOSABLE) ×2
KIT PREVENA INCISION MGT 13 (CANNISTER) ×2 IMPLANT
LIQUID BAND (GAUZE/BANDAGES/DRESSINGS) ×2 IMPLANT
NS IRRIG 1000ML POUR BTL (IV SOLUTION) ×2 IMPLANT
PACK C SECTION AR (MISCELLANEOUS) ×2 IMPLANT
PAD OB MATERNITY 4.3X12.25 (PERSONAL CARE ITEMS) ×2 IMPLANT
PAD PREP 24X41 OB/GYN DISP (PERSONAL CARE ITEMS) ×2 IMPLANT
SUT MAXON ABS #0 GS21 30IN (SUTURE) ×4 IMPLANT
SUT PLAIN GUT (SUTURE) ×2 IMPLANT
SUT VIC AB 1 CT1 36 (SUTURE) ×6 IMPLANT
SUT VIC AB 2-0 CT1 36 (SUTURE) ×2 IMPLANT
SUT VIC AB 4-0 FS2 27 (SUTURE) ×2 IMPLANT

## 2016-06-22 NOTE — Op Note (Signed)
Cesarean Section Procedure Note Indications: failed induction, failure to progress: arrest of dilation and severe preeclampsia  Pre-operative Diagnosis: Intrauterine pregnancy 3389w6d ;  failed induction, failure to progress: arrest of dilation and severe preeclampsia Post-operative Diagnosis: same, delivered. Procedure: Low Transverse Cesarean Section Surgeon: Annamarie MajorPaul Harris, MD, FACOG Assistant(s): Tresea MallJane Gledhill, CNM Anesthesia: General endotracheal anesthesia and (failed spinal) Estimated Blood Loss:500 Complications: None; patient tolerated the procedure well. Disposition: PACU - hemodynamically stable. Condition: stable  Findings: A female infant in the cephalic presentation. Amniotic fluid - Clear  Birth weight 2650 g.  Apgars of 8 and 9.  Intact placenta with a three-vessel cord. Grossly normal uterus, tubes and ovaries bilaterally. No intraabdominal adhesions were noted.  Procedure Details   The patient was taken to Operating Room, identified as the correct patient and the procedure verified as C-Section Delivery. A Time Out was held and the above information confirmed. After induction of anesthesia, the patient was draped and prepped in the usual sterile manner. A Pfannenstiel incision was made and carried down through the subcutaneous tissue to the fascia. Fascial incision was made and extended transversely with the Mayo scissors. The fascia was separated from the underlying rectus tissue superiorly and inferiorly. The peritoneum was identified and entered bluntly. Peritoneal incision was extended longitudinally. The utero-vesical peritoneal reflection was incised transversely and a bladder flap was created digitally.  A low transverse hysterotomy was made. The fetus was delivered atraumatically. The umbilical cord was clamped x2 and cut and the infant was handed to the awaiting pediatricians. The placenta was removed intact and appeared normal with a 3-vessel cord.  The uterus was  exteriorized and cleared of all clot and debris. The hysterotomy was closed with running sutures of 0 Vicryl suture. A second imbricating layer was placed with the same suture. Excellent hemostasis was observed. The uterus was returned to the abdomen. The pelvis was irrigated and again, excellent hemostasis was noted.  The On Q Pain pump System was then placed.  Trocars were placed through the abdominal wall into the subfascial space and these were used to thread the silver soaker cathaters into place.The rectus fascia was then reapproximated with running sutures of Maxon, with careful placement not to incorporate the cathaters. Subcutaneous tissues are then irrigated with saline and hemostasis assured.  Skin is then closed with 4-0 vicryl suture in a subcuticular fashion followed by skin adhesive. The cathaters are flushed each with 5 mL of Bupivicaine and stabilized into place with dressing. Instrument, sponge, and needle counts were correct prior to the abdominal closure and at the conclusion of the case.  The patient tolerated the procedure well and was transferred to the recovery room in stable condition.

## 2016-06-22 NOTE — Progress Notes (Signed)
Patient ID: Melissa Martinez, female   DOB: February 26, 1986, 30 y.o.   MRN: [id number] Labor Check  Subj:  No complaints. No headaches, no trouble breathing.   Rested well overnight w Cytotec (buccal)  Obj:  BP 137/78   Pulse 72   Temp 98.4 F (36.9 C) (Oral)   Resp 18   Ht 5\' 8"  (1.727 m)   Wt 284 lb (128.8 kg)   LMP 10/13/2015 (Exact Date)   SpO2 100%   BMI 43.18 kg/m  Dose (milli-units/min) Oxytocin: 0 milli-units/min --- Has been receiving Cytotec (3 doses so far since 1900 last pm) Cervix: Dilation: 1 / Effacement (%): 70 / Station: -1  Baseline FHR: 130    Variability: moderate    Accelerations: absent    Decelerations: absent Contractions: irreg   A/P: 30 y.o. G2P0010 female at 5769w6d with superimposed preeclampsia, severe.  1.  Labor: Cont misoprostol, dose placed Vaginally now by self.  Exam may be somewhat changed based on prior reports as it is more easily palpable (mid position) and is 1 cm.  Patient ok with plan.  2.  FWB: reassuring given magnesium exposure, Overall assessment: category 1  3.  GBS unknown, receiving abx until culture returns  4.  Pain: prn 5.  Recheck: prn   Annamarie MajorPaul [name], MD 06/22/2016 7:11 AM

## 2016-06-22 NOTE — Transfer of Care (Signed)
Immediate Anesthesia Transfer of Care Note  Patient: Melissa Martinez  Procedure(s) Performed: Procedure(s): CESAREAN SECTION (N/A)  Patient Location: PACU  Anesthesia Type:General  Level of Consciousness: awake, alert , oriented and patient cooperative  Airway & Oxygen Therapy: Patient Spontanous Breathing and Patient connected to face mask oxygen  Post-op Assessment: Report given to RN and Post -op Vital signs reviewed and stable  Post vital signs: Reviewed and stable  Last Vitals:  Vitals:   06/22/16 2100 06/22/16 2325  BP: (!) 153/80 123/90  Pulse: 84   Resp:    Temp:      Last Pain:  Vitals:   06/22/16 2325  TempSrc:   PainSc: 9       Patients Stated Pain Goal: 0 (06/20/16 0500)  Complications: No apparent anesthesia complications

## 2016-06-22 NOTE — Progress Notes (Signed)
UA sent to lab for PC ratio. Lab called to draw labs for CBC and CMP

## 2016-06-22 NOTE — Discharge Summary (Signed)
Obstetrical Discharge Summary  Date of Admission: 06/19/2016 Date of Discharge: 06/26/2016 Discharge Diagnosis: Preelampsia Primary OB:  [facility]   Gestational Age at Delivery: 573w6d  Antepartum complications: Preclampsia, Gestational Diabetes Date of Delivery: 06/26/2016  Delivered By: Annamarie MajorPaul Harris, MD Delivery Type: primary cesarean section, low transverse incision Intrapartum complications/course: Failure to Progress Anesthesia: general Placenta: manual removal Laceration: n/a Episiotomy: none Live born female Liliana ClineMadalyn Grace Birth Weight: 5 lb 13.5 oz (2650 g) APGAR: 8, 9   Post partum course: Since the delivery, patient has tolerate activity, diet, and daily functions without difficulty or complication.  Min lochia.  No breast concerns at this time.  No signs of depression currently. Her blood pressure continues to be elevated and she still has swelling +2 edema in lower extremities. Pt will be coming to hospital daily following discharge to be in nursery with baby. Recommend BP check during these visit. Pt denies HA, SOB, change of vision, epigastric pain.  BP (!) 153/87 (BP Location: Right Arm)   Pulse 84   Temp 98.4 F (36.9 C) (Oral)   Resp 18   Ht 5\' 8"  (1.727 m)   Wt 128.8 kg (284 lb)   LMP 10/13/2015 (Exact Date)   SpO2 99%   Breastfeeding   BMI 43.18 kg/m   Postpartum Exam:General appearance: alert, cooperative, appears stated age and no distress GI: Fundus firm Extremities: Homans sign is negative, no sign of DVT  Incision: wound vac in place- no drainage. On Q pump DC before going home  Disposition: home without infant- baby in special care until begins weight gain Rh Immune globulin given: yes Rubella vaccine given: no Varicella vaccine given: no Tdap vaccine given in AP or PP setting: given during prenatal care Flu vaccine given in AP or PP setting: no Contraception: Uncertain will discuss at 1 week postpartum visit  Prenatal Labs: O NEG//Rubella  Immune//RPR negative//HIV negative/HepB Surface Ag negative//plans to breastfeed   Plan:  Grover Canavanrica D Solazzo was discharged to home in good condition.  Take medications as prescribed: Labetalol 600mg  BID Oxycodone Ibuprofen Glucophage Prozac Prenatal vitamin Vitamin C Vitamin D  Daily Blood Pressure checks  See Dr Tiburcio PeaHarris for wound vac/incision check/BP check at 1 week postpartum   Dalyla Chui, CNM

## 2016-06-22 NOTE — Progress Notes (Signed)
Labor Progress Note   30 y.o. G2P0010 @ [redacted]w[redacted]d, admitted for  Pregnancy, Labor Management. IOL for severe GHT with superimposed preeclampsia  Subjective:  Pt has c/o pain in right side of sternum. Pt denies SOB and thinks pain is more gastric. She is requesting to sit up more in bed. She is aware of some tightening ctx's.  Objective:  BP 137/78   Pulse 72   Temp 98.6 F (37 C) (Oral)   Resp 18   Ht 5' 8"  (1.727 m)   Wt 128.8 kg (284 lb)   LMP 10/13/2015 (Exact Date)   SpO2 100%   BMI 43.18 kg/m   Lungs: B CTA Chest: RRR Abd: mild Extr: 1+ bilateral pedal edema SVE: CERVIX: 1.5 cm dilated, 50 effaced, -2 station, posterior  EFM: FHR: 145 bpm, variability: moderate,  accelerations:  Present,  decelerations:  Absent Toco: Frequency: Every 5 minutes mild Labs:   Results for MJIANNI, SHELDEN(MRN [id number] as of 06/22/2016 15:53  Ref. Range 06/22/2016 10:34 06/22/2016 10:56 06/22/2016 11:04  Sodium Latest Ref Range: 135 - 145 mmol/L   137  Potassium Latest Ref Range: 3.5 - 5.1 mmol/L   3.3 (L)  Chloride Latest Ref Range: 101 - 111 mmol/L   105  CO2 Latest Ref Range: 22 - 32 mmol/L   23  BUN Latest Ref Range: 6 - 20 mg/dL   8  Creatinine Latest Ref Range: 0.44 - 1.00 mg/dL   0.64  Calcium Latest Ref Range: 8.9 - 10.3 mg/dL   8.1 (L)  EGFR (Non-African Amer.) Latest Ref Range: >60 mL/min   >60  EGFR (African American) Latest Ref Range: >60 mL/min   >60  Glucose Latest Ref Range: 65 - 99 mg/dL   129 (H)  Anion gap Latest Ref Range: 5 - 15    9  Magnesium Latest Ref Range: 1.7 - 2.4 mg/dL  4.0 (H)   Alkaline Phosphatase Latest Ref Range: 38 - 126 U/L   175 (H)  Albumin Latest Ref Range: 3.5 - 5.0 g/dL   2.9 (L)  AST Latest Ref Range: 15 - 41 U/L   21  ALT Latest Ref Range: 14 - 54 U/L   12 (L)  Total Protein Latest Ref Range: 6.5 - 8.1 g/dL   6.9  Total Bilirubin Latest Ref Range: 0.3 - 1.2 mg/dL   0.1 (L)  WBC Latest Ref Range: 3.6 - 11.0 K/uL   12.5 (H)  RBC Latest Ref Range: 3.80  - 5.20 MIL/uL   4.00  Hemoglobin Latest Ref Range: 12.0 - 16.0 g/dL   11.3 (L)  HCT Latest Ref Range: 35.0 - 47.0 %   33.9 (L)  MCV Latest Ref Range: 80.0 - 100.0 fL   84.7  MCH Latest Ref Range: 26.0 - 34.0 pg   28.3  MCHC Latest Ref Range: 32.0 - 36.0 g/dL   33.4  RDW Latest Ref Range: 11.5 - 14.5 %   14.2  Platelets Latest Ref Range: 150 - 440 K/uL   284  Neutrophils Latest Units: %   74  Lymphocytes Latest Units: %   19  Monocytes Relative Latest Units: %   7  Eosinophil Latest Units: %   0  Basophil Latest Units: %   0  NEUT# Latest Ref Range: 1.4 - 6.5 K/uL   9.1 (H)  Lymphocyte # Latest Ref Range: 1.0 - 3.6 K/uL   2.4  Monocyte # Latest Ref Range: 0.2 - 0.9 K/uL   0.9  Eosinophils Absolute Latest Ref Range: 0 - 0.7 K/uL   0.0  Basophils Absolute Latest Ref Range: 0 - 0.1 K/uL   0.0  Total Protein, Urine Latest Units: mg/dL 15    Protein Creatinine Ratio Latest Ref Range: 0.00 - 0.15 mg/mgCre 0.42 (H)    Creatinine, Urine Latest Units: mg/dL 36       Assessment & Plan:  G2P0010 @ [redacted]w[redacted]d admitted for  Pregnancy and Labor/Delivery Management  1. Pain management: none. 2. FWB: FHT category I.  3. ID: GBS Unknown 4. Labor management: Start Pitocin. Will reassess when Dr HKenton Kingfisherarrives regarding possible foley, length of pitocin trial vs. C/S. All discussed with patient, see orders  Marcoantonio Legault, CNM

## 2016-06-22 NOTE — Progress Notes (Signed)
  Labor Progress Note   30 y.o. G2P0010 @ 7556w6d , admitted for  Pregnancy, Labor Management. Preeclampsia  Subjective:  Pt is comfortable. Her main complaint has been feeling warm from Magnesium. She occasionally feels a crampy contraction.   Objective:  BP 137/78   Pulse 72   Temp 98.6 F (37 C) (Oral)   Resp 18   Ht 5\' 8"  (1.727 m)   Wt 128.8 kg (284 lb)   LMP 10/13/2015 (Exact Date)   SpO2 100%   BMI 43.18 kg/m  Abd: mild Extr: 1+ bilateral pedal edema SVE: CERVIX: unable to reach cervix. Next dose of cytotec placed vaginal  EFM: FHR: 140 bpm, variability: moderate,  accelerations:  Present,  decelerations:  Absent Toco: occasional noted on tracing Labs: waiting for recent labs   Assessment & Plan:  G2P0010 @ 5856w6d, admitted for  Pregnancy and Labor/Delivery Management  1. Pain management: none. 2. FWB: FHT category I.  3. ID: GBS unknown, waiting for results of recent culture, prophylaxis until results 4. Labor management: Cytotec x5 All discussed with patient, see orders   Ranulfo Kall, CNM

## 2016-06-22 NOTE — Progress Notes (Signed)
Pt. C/o of right side sternum  Soreness. Pulse ox 100%, pulse 74 bpm, lips pink, denies  SOB or difficultly breathing. RN will notify provider.

## 2016-06-22 NOTE — Progress Notes (Signed)
Labor Progress Note   30 y.o. G2P0010 @ 106w6d, admitted for  Pregnancy, Labor Management. IOL for severe GHT with superimposed preeclampsia  Subjective:  Ctxs not too painful; has been on Cytotec and Pitocin today.  Objective:  BP (!) 148/88   Pulse 78   Temp 98.7 F (37.1 C) (Oral)   Resp 20   Ht 5' 8"  (1.727 m)   Wt 284 lb (128.8 kg)   LMP 10/13/2015 (Exact Date)   SpO2 100%   BMI 43.18 kg/m   Lungs: B CTA Chest: RRR Abd: mild Extr: 1+ bilateral pedal edema SVE: unchanged all day  EFM: FHR: 145 bpm, variability: moderate,  accelerations:  Present,  decelerations:  Absent Toco: Frequency: Every 5 minutes mild Labs:   Results for MHELMA, ARGYLE(MRN [id number] as of 06/22/2016 15:53  Ref. Range 06/22/2016 10:34 06/22/2016 10:56 06/22/2016 11:04  Sodium Latest Ref Range: 135 - 145 mmol/L   137  Potassium Latest Ref Range: 3.5 - 5.1 mmol/L   3.3 (L)  Chloride Latest Ref Range: 101 - 111 mmol/L   105  CO2 Latest Ref Range: 22 - 32 mmol/L   23  BUN Latest Ref Range: 6 - 20 mg/dL   8  Creatinine Latest Ref Range: 0.44 - 1.00 mg/dL   0.64  Calcium Latest Ref Range: 8.9 - 10.3 mg/dL   8.1 (L)  EGFR (Non-African Amer.) Latest Ref Range: >60 mL/min   >60  EGFR (African American) Latest Ref Range: >60 mL/min   >60  Glucose Latest Ref Range: 65 - 99 mg/dL   129 (H)  Anion gap Latest Ref Range: 5 - 15    9  Magnesium Latest Ref Range: 1.7 - 2.4 mg/dL  4.0 (H)   Alkaline Phosphatase Latest Ref Range: 38 - 126 U/L   175 (H)  Albumin Latest Ref Range: 3.5 - 5.0 g/dL   2.9 (L)  AST Latest Ref Range: 15 - 41 U/L   21  ALT Latest Ref Range: 14 - 54 U/L   12 (L)  Total Protein Latest Ref Range: 6.5 - 8.1 g/dL   6.9  Total Bilirubin Latest Ref Range: 0.3 - 1.2 mg/dL   0.1 (L)  WBC Latest Ref Range: 3.6 - 11.0 K/uL   12.5 (H)  RBC Latest Ref Range: 3.80 - 5.20 MIL/uL   4.00  Hemoglobin Latest Ref Range: 12.0 - 16.0 g/dL   11.3 (L)  HCT Latest Ref Range: 35.0 - 47.0 %   33.9 (L)  MCV Latest  Ref Range: 80.0 - 100.0 fL   84.7  MCH Latest Ref Range: 26.0 - 34.0 pg   28.3  MCHC Latest Ref Range: 32.0 - 36.0 g/dL   33.4  RDW Latest Ref Range: 11.5 - 14.5 %   14.2  Platelets Latest Ref Range: 150 - 440 K/uL   284  Neutrophils Latest Units: %   74  Lymphocytes Latest Units: %   19  Monocytes Relative Latest Units: %   7  Eosinophil Latest Units: %   0  Basophil Latest Units: %   0  NEUT# Latest Ref Range: 1.4 - 6.5 K/uL   9.1 (H)  Lymphocyte # Latest Ref Range: 1.0 - 3.6 K/uL   2.4  Monocyte # Latest Ref Range: 0.2 - 0.9 K/uL   0.9  Eosinophils Absolute Latest Ref Range: 0 - 0.7 K/uL   0.0  Basophils Absolute Latest Ref Range: 0 - 0.1 K/uL   0.0  Total Protein,  Urine Latest Units: mg/dL 15    Protein Creatinine Ratio Latest Ref Range: 0.00 - 0.15 mg/mgCre 0.42 (H)    Creatinine, Urine Latest Units: mg/dL 36       Assessment & Plan:  G2P0010 @ [redacted]w[redacted]d admitted for  Pregnancy and Labor/Delivery Management  1. Pain management: none. 2. FWB: FHT category I.  3. ID: GBS NEG 4. Labor management:  The risks of cesarean section discussed with the patient included but were not limited to: bleeding which may require transfusion or reoperation; infection which may require antibiotics; injury to bowel, bladder, ureters or other surrounding organs; injury to the fetus; need for additional procedures including hysterectomy in the event of a life-threatening hemorrhage; placental abnormalities wth subsequent pregnancies, incisional problems, thromboembolic phenomenon and other postoperative/anesthesia complications. The patient concurred with the proposed plan, giving informed written consent for the procedure.   Cont MAGNESIUM now and 24 hours postpartum.    All discussed with patient, see orders  RHoyt Koch MD

## 2016-06-22 NOTE — Anesthesia Preprocedure Evaluation (Addendum)
Anesthesia Evaluation  Patient identified by MRN, date of birth, ID band Patient awake    Reviewed: Allergy & Precautions, NPO status , Patient's Chart, lab work & pertinent test results  Airway Mallampati: II  TM Distance: >3 FB     Dental no notable dental hx.    Pulmonary Current Smoker,  Upper airway cough syndrome   Pulmonary exam normal        Cardiovascular hypertension, Normal cardiovascular exam  Pre-eclamsia   Neuro/Psych Anxiety negative neurological ROS     GI/Hepatic Fatty infiltration of liver Altered bowel function   Endo/Other  diabetes, Well Controlled, Gestational  Renal/GU Hx of kidney stones  Female GU complaint Polycystic ovarian syndrome    Musculoskeletal   Abdominal (+) + obese,   Peds negative pediatric ROS (+)  Hematology negative hematology ROS (+)   Anesthesia Other Findings   Reproductive/Obstetrics (+) Pregnancy                            Anesthesia Physical Anesthesia Plan  ASA: II and emergent  Anesthesia Plan: Spinal   Post-op Pain Management:    Induction:   Airway Management Planned: Nasal Cannula  Additional Equipment:   Intra-op Plan:   Post-operative Plan:   Informed Consent: I have reviewed the patients History and Physical, chart, labs and discussed the procedure including the risks, benefits and alternatives for the proposed anesthesia with the patient or authorized representative who has indicated his/her understanding and acceptance.   Dental advisory given  Plan Discussed with: CRNA and Surgeon  Anesthesia Plan Comments:         Anesthesia Quick Evaluation

## 2016-06-23 ENCOUNTER — Encounter: Payer: Self-pay | Admitting: Obstetrics & Gynecology

## 2016-06-23 LAB — CBC
HCT: 34.2 % — ABNORMAL LOW (ref 35.0–47.0)
Hemoglobin: 11.7 g/dL — ABNORMAL LOW (ref 12.0–16.0)
MCH: 28.5 pg (ref 26.0–34.0)
MCHC: 34 g/dL (ref 32.0–36.0)
MCV: 83.8 fL (ref 80.0–100.0)
PLATELETS: 276 10*3/uL (ref 150–440)
RBC: 4.08 MIL/uL (ref 3.80–5.20)
RDW: 14 % (ref 11.5–14.5)
WBC: 14.4 10*3/uL — ABNORMAL HIGH (ref 3.6–11.0)

## 2016-06-23 LAB — FETAL SCREEN: Fetal Screen: NEGATIVE

## 2016-06-23 MED ORDER — HYDROMORPHONE 1 MG/ML IV SOLN
INTRAVENOUS | Status: DC
  Administered 2016-06-23: 05:00:00 via INTRAVENOUS
  Filled 2016-06-23: qty 25

## 2016-06-23 MED ORDER — SODIUM CHLORIDE FLUSH 0.9 % IV SOLN
INTRAVENOUS | Status: AC
  Filled 2016-06-23: qty 20

## 2016-06-23 MED ORDER — MAGNESIUM SULFATE 50 % IJ SOLN: 2.0000 g/h | INTRAVENOUS | Status: DC

## 2016-06-23 MED ORDER — NALOXONE HCL 0.4 MG/ML IJ SOLN: 0.4000 mg | INTRAMUSCULAR | Status: DC | PRN

## 2016-06-23 MED ORDER — MEPERIDINE HCL 25 MG/ML IJ SOLN
INTRAMUSCULAR | Status: AC
  Filled 2016-06-23: qty 1

## 2016-06-23 MED ORDER — LABETALOL HCL 100 MG PO TABS
200.0000 mg | ORAL_TABLET | Freq: Two times a day (BID) | ORAL | Status: DC
  Administered 2016-06-23 – 2016-06-24 (×3): 200 mg via ORAL
  Filled 2016-06-23: qty 1
  Filled 2016-06-23: qty 2
  Filled 2016-06-23 (×2): qty 1

## 2016-06-23 MED ORDER — SODIUM CHLORIDE 0.9% FLUSH: 9.0000 mL | INTRAVENOUS | Status: DC | PRN

## 2016-06-23 MED ORDER — MORPHINE SULFATE (PF) 4 MG/ML IV SOLN
INTRAVENOUS | Status: AC
  Filled 2016-06-23: qty 1

## 2016-06-23 MED ORDER — ONDANSETRON HCL 4 MG/2ML IJ SOLN: 4.0000 mg | Freq: Once | INTRAMUSCULAR | Status: DC | PRN

## 2016-06-23 MED ORDER — MEPERIDINE HCL 25 MG/ML IJ SOLN: 12.5000 mg | Freq: Four times a day (QID) | INTRAMUSCULAR | Status: DC | PRN

## 2016-06-23 MED ORDER — FENTANYL CITRATE (PF) 100 MCG/2ML IJ SOLN
25.0000 ug | INTRAMUSCULAR | Status: AC | PRN
Start: 2016-06-23 — End: 2016-06-23
  Administered 2016-06-23 (×6): 25 ug via INTRAVENOUS
  Filled 2016-06-23 (×2): qty 2

## 2016-06-23 NOTE — Anesthesia Procedure Notes (Signed)
Spinal  Patient location during procedure: OR Start time: 06/22/2016 9:58 PM End time: 06/22/2016 10:01 PM Staffing Anesthesiologist: Yves DillARROLL, Kamie Korber Performed: anesthesiologist  Preanesthetic Checklist Completed: patient identified, site marked, surgical consent, pre-op evaluation, timeout performed, IV checked, risks and benefits discussed and monitors and equipment checked Spinal Block Patient position: sitting Prep: Betadine and site prepped and draped Patient monitoring: heart rate, cardiac monitor, continuous pulse ox and blood pressure Approach: midline Location: L3-4 Injection technique: single-shot Needle Needle type: Whitacre  Needle gauge: 25 G Needle length: 9 cm Assessment Sensory level: T6 Additional Notes Time out called.  Patient placed in sitting position.  Back prepped and draped in sterile fashion.  A skin wheal was made in the L3-L4 interspace.  A 25G Whitacre needle with the return of clear, colorless CSF in all 4 quads was obtained.  Very easy spinal with no blood or paresthesias and patient tolerated the procedure well.

## 2016-06-23 NOTE — Anesthesia Postprocedure Evaluation (Signed)
Anesthesia Post Note  Patient: Melissa Martinez  Procedure(s) Performed: Procedure(s) (LRB): CESAREAN SECTION (N/A)  Patient location during evaluation: Mother Baby Anesthesia Type: General Level of consciousness: awake and alert and oriented Pain management: satisfactory to patient Vital Signs Assessment: post-procedure vital signs reviewed and stable Respiratory status: respiratory function stable Cardiovascular status: blood pressure returned to baseline Postop Assessment: no headache, no backache, spinal receding, adequate PO intake, no signs of nausea or vomiting and patient able to bend at knees Anesthetic complications: no Comments: Spinal anesthetic converted to general due to inadequate level.    Last Vitals:  Vitals:   06/23/16 0525 06/23/16 0625  BP: (!) 153/86 139/81  Pulse:    Resp: (!) 21 17  Temp: 37.1 C     Last Pain:  Vitals:   06/23/16 0800  TempSrc:   PainSc: 4                  Clydene PughBeane, Demeisha Geraghty D

## 2016-06-23 NOTE — Progress Notes (Signed)
Admit Date: 06/19/2016 Today's Date: 06/23/2016  Subjective: Postpartum Day 1: Cesarean Delivery for failure to progress after IOL for Preclampsia at 34 weeks; also has risk factors of gest diabetes and obesity Patient reports incisional pain.  Improved uterine/deep pains w PCA.  No appetite. On Magnesium postpartum for 24 hours.  Objective: Vital signs in last 24 hours: Temp:  [97.9 F (36.6 C)-101 F (38.3 C)] 98.7 F (37.1 C) (08/10 0525) Pulse Rate:  [75-85] 78 (08/10 0430) Resp:  [15-22] 17 (08/10 0625) BP: (123-156)/(76-93) 139/81 (08/10 0625) SpO2:  [93 %-100 %] 95 % (08/10 [id number])  Physical Exam:  General: alert, cooperative and no distress Lochia: appropriate Uterine Fundus: firm Incision: Provena dressing intact, OnQ as well. DVT Evaluation: No evidence of DVT seen on physical exam.   Recent Labs  06/22/16 1104 06/23/16 0606  HGB 11.3* 11.7*  HCT 33.9* 34.2*    Assessment/Plan: Status post Cesarean section. Doing well postoperatively.  Cont MAGNESIUM for 24 hours pp.   Labetolol po for elevated BPs thru the night (started last night, last BP improving). Monitor BS but likely not a worry moving forward. Advance diet today.  Ambulate once magnesium done. Wound care discussed with Provena, OnQ. Infant doing well Continue current care.  Letitia Libraobert Paul Kolette Vey 06/23/2016, 8:04 AM

## 2016-06-23 NOTE — Anesthesia Post-op Follow-up Note (Signed)
  Anesthesia Pain Follow-up Note  Patient: Melissa Martinez  Day #: 1  Date of Follow-up: 06/23/2016 Time: 8:27 AM  Last Vitals:  Vitals:   06/23/16 0525 06/23/16 0625  BP: (!) 153/86 139/81  Pulse:    Resp: (!) 21 17  Temp: 37.1 C     Level of Consciousness: alert  Pain: mild   Side Effects:None  Catheter Site Exam: site not evaluated     Plan: D/C from anesthesia care  Clydene PughBeane, Yazeed Pryer D

## 2016-06-24 LAB — ANTIBODY SCREEN: ANTIBODY SCREEN: POSITIVE

## 2016-06-24 LAB — ABO/RH: ABO/RH(D): O NEG

## 2016-06-24 MED ORDER — RHO D IMMUNE GLOBULIN 1500 UNIT/2ML IJ SOSY
300.0000 ug | PREFILLED_SYRINGE | Freq: Once | INTRAMUSCULAR | Status: AC
  Administered 2016-06-24: 300 ug via INTRAMUSCULAR
  Filled 2016-06-24: qty 2

## 2016-06-24 MED ORDER — LABETALOL HCL 100 MG PO TABS
400.0000 mg | ORAL_TABLET | Freq: Two times a day (BID) | ORAL | Status: DC
  Administered 2016-06-24 – 2016-06-25 (×3): 400 mg via ORAL
  Filled 2016-06-24 (×3): qty 4

## 2016-06-24 MED ORDER — IBUPROFEN 600 MG PO TABS
600.0000 mg | ORAL_TABLET | Freq: Four times a day (QID) | ORAL | Status: DC | PRN
  Administered 2016-06-24 – 2016-06-26 (×7): 600 mg via ORAL
  Filled 2016-06-24 (×7): qty 1

## 2016-06-24 NOTE — Plan of Care (Signed)
Patient refused POCT blood glucose check this morning because she is not diabetic.  Patient stated that she takes metformin for polycystic ovary syndrome, not gestational diabetes.

## 2016-06-24 NOTE — Lactation Note (Signed)
This note was copied from a baby's chart. Lactation Consultation Note  Patient Name: Melissa Martinez [id number]'s Date: 06/24/2016 Reason for consult: Initial assessment;Infant < 6lbs;Late preterm infant   Maternal Data  P1 Mom pumping to get drops of colostrum every 3 hours. Baby now sleepy at breast and sliding off nipple despite mom trying to express drops into her mouth. At 35 weeks, it is appropriate to consider nipple shield to help with latch and milk transfer. Mom agreed. 20 mm works well for baby (24 may be best for mom, but too big for baby right now). I inserted Neosure into shield with curved syringe and she immediately started to wake and nurse vigorously. She paced herself well and even burped herself well. It took her approx 20 minutes to get 20 ml and then she kept nursing a few minutes more. Mom states this has been the best breastfeeding yet and her sucking feels like pump. I explained how to use and clean nipple shield and syringe. I encouraged he rto continue pumping/hand expressing milk every 3 hours. Time does not allow for me to assess pumping, so I encouraged her to have LC check pump in am. She did say it felt comfortable and not too tight. [name] was observing part of the feeding with me and is informed of plan.   Feeding Feeding Type: Breast Milk with Formula added  LATCH Score/Interventions Latch: Grasps breast easily, tongue down, lips flanged, rhythmical sucking. (wiht nipple shield only)  Audible Swallowing: A few with stimulation Intervention(s):  (formula inserted in shield or corner of mouth wiht syringe)  Type of Nipple: Everted at rest and after stimulation  Comfort (Breast/Nipple): Soft / non-tender     Hold (Positioning): Assistance needed to correctly position infant at breast and maintain latch. Intervention(s): Support Pillows  LATCH Score: 8  Lactation Tools Discussed/Used Tools:  (curved tip syringe)   Consult Status Consult Status:  Follow-up Date: 06/25/16 Follow-up type: In-patient    Melissa Martinez 06/24/2016, 6:10 PM

## 2016-06-24 NOTE — Progress Notes (Signed)
Subjective:  Doing well, no concerns no issues.  Denies HA vision changes.  Still some edema.  Minimal lochia.  Pain well controlled  Objective:  Blood pressure (!) 152/86, pulse 88, temperature 98.4 F (36.9 C), temperature source Oral, resp. rate (!) 22, height 5' 8"  (1.727 m), weight 128.8 kg (284 lb), last menstrual period 10/13/2015, SpO2 97 %, unknown if currently breastfeeding.  General: NAD Pulmonary: no increased work of breathing Abdomen: non-distended, non-tender, fundus firm at level of umbilicus Incision: D/C/I  Extremities: no edema, no erythema, no tenderness  Results for orders placed or performed during the hospital encounter of 06/19/16 (from the past 72 hour(s))  Protein / creatinine ratio, urine     Status: Abnormal   Collection Time: 06/22/16 10:34 AM  Result Value Ref Range   Creatinine, Urine 36 mg/dL   Total Protein, Urine 15 mg/dL    Comment: NO NORMAL RANGE ESTABLISHED FOR THIS TEST   Protein Creatinine Ratio 0.42 (H) 0.00 - 0.15 mg/mg[Cre]  Magnesium     Status: Abnormal   Collection Time: 06/22/16 10:56 AM  Result Value Ref Range   Magnesium 4.0 (H) 1.7 - 2.4 mg/dL  Comprehensive metabolic panel     Status: Abnormal   Collection Time: 06/22/16 11:04 AM  Result Value Ref Range   Sodium 137 135 - 145 mmol/L   Potassium 3.3 (L) 3.5 - 5.1 mmol/L   Chloride 105 101 - 111 mmol/L   CO2 23 22 - 32 mmol/L   Glucose, Bld 129 (H) 65 - 99 mg/dL   BUN 8 6 - 20 mg/dL   Creatinine, Ser 0.64 0.44 - 1.00 mg/dL   Calcium 8.1 (L) 8.9 - 10.3 mg/dL   Total Protein 6.9 6.5 - 8.1 g/dL   Albumin 2.9 (L) 3.5 - 5.0 g/dL   AST 21 15 - 41 U/L   ALT 12 (L) 14 - 54 U/L   Alkaline Phosphatase 175 (H) 38 - 126 U/L   Total Bilirubin 0.1 (L) 0.3 - 1.2 mg/dL   GFR calc non Af Amer >60 >60 mL/min   GFR calc Af Amer >60 >60 mL/min    Comment: (NOTE) The eGFR has been calculated using the CKD EPI equation. This calculation has not been validated in all clinical  situations. eGFR's persistently <60 mL/min signify possible Chronic Kidney Disease.    Anion gap 9 5 - 15  CBC with Differential/Platelet     Status: Abnormal   Collection Time: 06/22/16 11:04 AM  Result Value Ref Range   WBC 12.5 (H) 3.6 - 11.0 K/uL   RBC 4.00 3.80 - 5.20 MIL/uL   Hemoglobin 11.3 (L) 12.0 - 16.0 g/dL   HCT 33.9 (L) 35.0 - 47.0 %   MCV 84.7 80.0 - 100.0 fL   MCH 28.3 26.0 - 34.0 pg   MCHC 33.4 32.0 - 36.0 g/dL   RDW 14.2 11.5 - 14.5 %   Platelets 284 150 - 440 K/uL   Neutrophils Relative % 74 %   Neutro Abs 9.1 (H) 1.4 - 6.5 K/uL   Lymphocytes Relative 19 %   Lymphs Abs 2.4 1.0 - 3.6 K/uL   Monocytes Relative 7 %   Monocytes Absolute 0.9 0.2 - 0.9 K/uL   Eosinophils Relative 0 %   Eosinophils Absolute 0.0 0 - 0.7 K/uL   Basophils Relative 0 %   Basophils Absolute 0.0 0 - 0.1 K/uL  Platelet count     Status: None   Collection Time: 06/22/16  7:10 PM  Result Value Ref Range   Platelets 268 150 - 440 K/uL  CBC     Status: Abnormal   Collection Time: 06/23/16  6:06 AM  Result Value Ref Range   WBC 14.4 (H) 3.6 - 11.0 K/uL   RBC 4.08 3.80 - 5.20 MIL/uL   Hemoglobin 11.7 (L) 12.0 - 16.0 g/dL   HCT 34.2 (L) 35.0 - 47.0 %   MCV 83.8 80.0 - 100.0 fL   MCH 28.5 26.0 - 34.0 pg   MCHC 34.0 32.0 - 36.0 g/dL   RDW 14.0 11.5 - 14.5 %   Platelets 276 150 - 440 K/uL  Rhogam injection     Status: None (Preliminary result)   Collection Time: 06/23/16  6:06 AM  Result Value Ref Range   Unit Number [id number]    Blood Component Type RHIG    Unit division 00    Status of Unit ALLOCATED    Transfusion Status OK TO TRANSFUSE   Fetal screen     Status: None   Collection Time: 06/23/16  6:06 AM  Result Value Ref Range   Fetal Screen NEG   ABO/Rh     Status: None   Collection Time: 06/24/16  7:55 AM  Result Value Ref Range   ABO/RH(D) O NEG   Antibody screen     Status: None   Collection Time: 06/24/16  7:55 AM  Result Value Ref Range   Antibody Screen POS     Antibody Identification PASSIVELY ACQUIRED ANTI-D     Information for the patient's newborn:  Ivyanna, Sibert Girl Vela [[id number]]  O POS   Assessment:   30 y.o. G2P0111 postoperativeday #2 1LTCS, preeclampsia with severe features  Plan:  1) Acute blood loss anemia - hemodynamically stable and asymptomatic - po ferrous sulfate  2) O neg / RI / VZI  3) TDAP status 05/25/16  4) Breast/Undecided  5) Preeclampsia with severe features - s/p 24-hrs magnesium sulfate postpartum - increase labetalol to 422m po bid  6) Disposition anticipate discharge POD 3-4

## 2016-06-25 NOTE — Progress Notes (Addendum)
Post Op Day 3 Subjective:   Pt is ambulating and voiding without difficulty. She is tolerating PO intake and her pain is well controlled with PO pain meds. Originally this am she was considering going home today, but she has changed her mind due to her new dose of labetalol and wants to wait and see how she responds to the 400 mg BID prior to DC. She also continues to have swelling in her legs that is concerning her. Pt is ambulating to nursery for breastfeeding due to baby being in special care for weight issues. Denies HA, SOB, Change of vision, epigastric pain.  Objective:  Blood pressure (!) 158/83, pulse 95, temperature 98.4 F (36.9 C), temperature source Oral, resp. rate 20, height 5\' 8"  (1.727 m), weight 128.8 kg (284 lb), last menstrual period 10/13/2015, SpO2 98 %, breastfeeding.  General: NAD Pulmonary: no increased work of breathing Abdomen: non-distended, non-tender, fundus firm at level of umbilicus Incision: C/D/I Extremities: 2+ edema bilaterally LE, no erythema, no tenderness   Assessment:   30 y.o. [id number] postoperative day # 3   Plan:  1) Acute blood loss anemia - hemodynamically stable and asymptomatic - po ferrous sulfate  2) O negative: Rhogam given (baby is O+)  3) TDAP status: UTD  4) Breast//Contraception: considering options  5) Disposition: discharge to home tomorrow   Tresea MallGLEDHILL,Beyonka Pitney, CNM

## 2016-06-26 LAB — RHOGAM INJECTION: Unit division: 0

## 2016-06-26 MED ORDER — IBUPROFEN 600 MG PO TABS: 600.0000 mg | ORAL_TABLET | Freq: Four times a day (QID) | ORAL | 0 refills | Status: DC | PRN

## 2016-06-26 MED ORDER — LABETALOL HCL 300 MG PO TABS: 600.0000 mg | ORAL_TABLET | Freq: Two times a day (BID) | ORAL | 1 refills | Status: DC

## 2016-06-26 MED ORDER — LABETALOL HCL 100 MG PO TABS
100.0000 mg | ORAL_TABLET | Freq: Once | ORAL | Status: AC
  Administered 2016-06-26: 100 mg via ORAL
  Filled 2016-06-26: qty 1

## 2016-06-26 MED ORDER — LABETALOL HCL 100 MG PO TABS
600.0000 mg | ORAL_TABLET | Freq: Two times a day (BID) | ORAL | Status: DC
  Administered 2016-06-26: 600 mg via ORAL
  Filled 2016-06-26: qty 6

## 2016-06-26 MED ORDER — OXYCODONE-ACETAMINOPHEN 5-325 MG PO TABS: 1.0000 | ORAL_TABLET | ORAL | 0 refills | Status: DC | PRN

## 2016-06-26 NOTE — Progress Notes (Signed)
Patient understands all discharge instructions and the need to make follow up appointments. Patient discharge via wheelchair with auxillary. 

## 2016-06-28 ENCOUNTER — Ambulatory Visit: Payer: Self-pay

## 2016-06-28 NOTE — Lactation Note (Signed)
This note was copied from a baby's chart. Lactation Consultation Note  Patient Name: Melissa Martinez ZOXWR'UToday's Date: 06/28/2016   Baby going home with Mom today. Mom to pump and bottle feed for now. She obtains approx 2 oz per side about 5 times a day. I reminded her how pumping at least 8 times a day will help to provide optimal milk yield for a longer period of time. It may be easier for her to do now that baby is home with her. [facility]18 at 1 pm. PLan to discuss milk production progress and whether baby is ready to try 1-2 breastfeeds/day by then or not using pre/post wt check then.   Maternal Data    Feeding Feeding Type: Bottle Fed - Formula Nipple Type: Slow - flow Length of feed: 25 min  LATCH Score/Interventions                      Lactation Tools Discussed/Used     Consult Status      Sunday CornSandra Clark [place] 06/28/2016, 1:49 PM

## 2016-08-16 ENCOUNTER — Emergency Department: Payer: BLUE CROSS/BLUE SHIELD

## 2016-08-16 ENCOUNTER — Emergency Department
Admission: EM | Admit: 2016-08-16 | Discharge: 2016-08-16 | Disposition: A | Discharge status: Home / Self Care | Payer: BLUE CROSS/BLUE SHIELD | Attending: Emergency Medicine | Admitting: Emergency Medicine

## 2016-08-16 DIAGNOSIS — S6992XA Unspecified injury of left wrist, hand and finger(s), initial encounter: Secondary | ICD-10-CM | POA: Diagnosis present

## 2016-08-16 DIAGNOSIS — Z791 Long term (current) use of non-steroidal anti-inflammatories (NSAID): Secondary | ICD-10-CM | POA: Insufficient documentation

## 2016-08-16 DIAGNOSIS — Y929 Unspecified place or not applicable: Secondary | ICD-10-CM | POA: Diagnosis not present

## 2016-08-16 DIAGNOSIS — Y999 Unspecified external cause status: Secondary | ICD-10-CM | POA: Diagnosis not present

## 2016-08-16 DIAGNOSIS — Z79899 Other long term (current) drug therapy: Secondary | ICD-10-CM | POA: Diagnosis not present

## 2016-08-16 DIAGNOSIS — S52502A Unspecified fracture of the lower end of left radius, initial encounter for closed fracture: Secondary | ICD-10-CM | POA: Insufficient documentation

## 2016-08-16 DIAGNOSIS — F17200 Nicotine dependence, unspecified, uncomplicated: Secondary | ICD-10-CM | POA: Insufficient documentation

## 2016-08-16 DIAGNOSIS — Y9389 Activity, other specified: Secondary | ICD-10-CM | POA: Insufficient documentation

## 2016-08-16 DIAGNOSIS — W108XXA Fall (on) (from) other stairs and steps, initial encounter: Secondary | ICD-10-CM | POA: Diagnosis not present

## 2016-08-16 DIAGNOSIS — Z7984 Long term (current) use of oral hypoglycemic drugs: Secondary | ICD-10-CM | POA: Insufficient documentation

## 2016-08-16 MED ORDER — ACETAMINOPHEN 325 MG PO TABS
650.0000 mg | ORAL_TABLET | Freq: Once | ORAL | Status: AC
  Administered 2016-08-16: 650 mg via ORAL
  Filled 2016-08-16: qty 2

## 2016-08-16 NOTE — ED Notes (Signed)
Pt alert and oriented X4, active, cooperative, pt in NAD. RR even and unlabored, color WNL.  Pt informed to return if any life threatening symptoms occur.   

## 2016-08-16 NOTE — ED Notes (Signed)
Report from Butch, RN. Care assumed by this RN. 

## 2016-08-16 NOTE — ED Triage Notes (Signed)
Patient ambulatory to triage with steady gait, without difficulty or distress noted; pt reports injuring left wrist falling down steps PTA: denies any other c/o or injuries

## 2016-08-16 NOTE — ED Provider Notes (Signed)
[facility] Emergency Department Provider Note  ____________________________________________   First MD Initiated Contact with Patient 08/16/16 [phone]     (approximate)  I have reviewed the triage vital signs and the nursing notes.   HISTORY  Chief Complaint Wrist Pain    HPI Melissa Martinez is a 30 y.o. female who presents for evaluation of acute onset left wrist pain after a fall.  She reports that she slipped and fell down about 4 steps catching herself with her left wrist.  She did not strike her head and did not lose consciousness and reports no headache or neck pain.  She has sharp pain around her left wrist that is made worse with any movement or motion of the arm and specifically flexion or extension of the wrist.  She has normal sensation throughout the extremity including the tips of her fingers and is able to wiggle her fingers but she feels like there is a "grinding sensation" when she tries to rotate her arm or wrist.  She has no significant obvious deformity and has no other injuries.   Past Medical History:  Diagnosis Date  . Abdominal pain, generalized 11/11/2014  . Altered bowel function 11/11/2014  . Anxiety    tkes Prozac, for OCD related anxiety  . Bleeding per rectum 11/11/2014  . Calculus of kidney 12/14/2015  . Complication of anesthesia   . Fatty infiltration of liver 11/11/2014  . PCOS (polycystic ovarian syndrome)   . Upper airway cough syndrome 08/17/2015   Followed in Pulmonary clinic/  Healthcare/ [facility]      Patient Active Problem List   Diagnosis Date Noted  . Postpartum care following cesarean delivery 06/25/2016  . Preeclampsia 06/22/2016  . Labor and delivery, indication for care 06/20/2016  . Calculus of kidney 12/14/2015  . Bilateral polycystic ovarian syndrome 12/14/2015  . Upper airway cough syndrome 08/17/2015  . Abdominal pain, generalized 11/11/2014  . Bleeding per rectum 11/11/2014  . Altered bowel  function 11/11/2014  . Fatty infiltration of liver 11/11/2014    Past Surgical History:  Procedure Laterality Date  . CESAREAN SECTION N/A 06/22/2016   Procedure: CESAREAN SECTION;  Surgeon: [name], MD;  Location: [facility] ORS;  Service: Obstetrics;  Laterality: N/A;  . CHOLECYSTECTOMY    . COLONOSCOPY  09/2014  . LAPAROSCOPIC CHOLECYSTECTOMY  06/2009  . STENT PLACE LEFT URETER ([facility] HX) Left 11/2012   has had 2 stones pass stones    Prior to Admission medications   Medication Sig Start Date End Date Taking? Authorizing Provider  Cholecalciferol (VITAMIN D3) 2000 UNITS capsule Take 2,000 Units by mouth daily. Pt states she takes 4000 iu daily    Historical Provider, MD  FLUoxetine (PROZAC) 10 MG capsule Take 20 mg by mouth daily.    Historical Provider, MD  ibuprofen (ADVIL,MOTRIN) 600 MG tablet Take 1 tablet (600 mg total) by mouth every 6 (six) hours as needed for mild pain. 06/26/16   Tresea Mall, CNM  labetalol (NORMODYNE) 300 MG tablet Take 2 tablets (600 mg total) by mouth 2 (two) times daily. 06/26/16   Tresea Mall, CNM  metFORMIN (GLUCOPHAGE-XR) 500 MG 24 hr tablet Take 500 mg by mouth daily.    Historical Provider, MD  oxyCODONE-acetaminophen (PERCOCET/ROXICET) 5-325 MG tablet Take 1-2 tablets by mouth every 4 (four) hours as needed (pain scale 4-7). 06/26/16   Tresea Mall, CNM  Prenatal Vit-Fe Fumarate-FA (PRENATAL VITAMIN PO) Take 1 tablet by mouth daily.    Historical Provider, MD  VITAMIN C, CALCIUM ASCORBATE, PO Take by mouth.    Historical Provider, MD    Allergies Bactrim [sulfamethoxazole-trimethoprim]; Equal [aspartame]; and Levaquin [levofloxacin in d5w]  Family History  Problem Relation Age of Onset  . Heart disease Father   . Hyperlipidemia Father   . Breast cancer Maternal Grandmother   . Diabetes Maternal Grandmother   . Bladder Cancer Maternal Grandfather   . Heart disease Maternal Grandfather   . Kidney Stones Mother   . Hypertension Mother   .  Hypertension Brother     Social History Social History  Substance Use Topics  . Smoking status: Current Every Day Smoker    Packs/day: 0.00  . Smokeless tobacco: Never Used  . Alcohol use No     Comment: rarely    Review of Systems Eyes: No visual changes. Cardiovascular: Denies chest pain. Respiratory: Denies shortness of breath. Gastrointestinal: No abdominal pain.  No nausea, no vomiting.  No diarrhea.  No constipation. Genitourinary: Negative for dysuria. Musculoskeletal: Left wrist pain after fall.  Negative for back pain. Skin: Negative for rash nor lacerations/abrasions Neurological: Negative for headaches, focal weakness or numbness.  ____________________________________________   PHYSICAL EXAM:  VITAL SIGNS: ED Triage Vitals  Enc Vitals Group     BP 08/16/16 0535 (!) 159/100     Pulse Rate 08/16/16 0535 92     Resp 08/16/16 0535 18     Temp 08/16/16 0535 98.6 F (37 C)     Temp Source 08/16/16 0535 Oral     SpO2 08/16/16 0535 98 %     Weight 08/16/16 0532 270 lb (122.5 kg)     Height 08/16/16 0532 5\' 8"  (1.727 m)     Head Circumference --      Peak Flow --      Pain Score 08/16/16 0533 6     Pain Loc --      Pain Edu? --      Excl. in GC? --     Constitutional: Alert and oriented. Well appearing and in no acute distress. Eyes: Conjunctivae are normal. PERRL. EOMI. Head: Atraumatic. Neck: No stridor.  No meningeal signs.  No cervical spine tenderness to palpation. Cardiovascular: Normal rate, regular rhythm. Good peripheral circulation.  Normal cap refill. Respiratory: Normal respiratory effort.  No retractions.  Musculoskeletal: Minimal swelling around the left wrist.  No ecchymosis.  Severe tenderness to palpation primarily on the radial aspect of the wrist.  Able to move all fingers.   Neurologic:  Normal speech and language. No gross focal neurologic deficits are appreciated.  N/V intact to fingers.  Skin:  Skin is warm, dry and intact. No rash  noted. Psychiatric: Mood and affect are normal. Speech and behavior are normal.  ____________________________________________   LABS (all labs ordered are listed, but only abnormal results are displayed)  Labs Reviewed - No data to display ____________________________________________  EKG  None - EKG not ordered by ED physician ____________________________________________  RADIOLOGY   Dg Wrist Complete Left  Result Date: 08/16/2016 CLINICAL DATA:  Trip and fall down stairs.  Left wrist pain. EXAM: LEFT WRIST - COMPLETE 3+ VIEW COMPARISON:  None. FINDINGS: Transverse nondisplaced fracture of the distal left radial metaphysis. Fracture line extends to the radial ulnar joint without extension to the radiocarpal joint. No ulnar styloid process fracture. Carpal bones appear intact. Mild soft tissue swelling. IMPRESSION: Transverse nondisplaced fracture of the distal left radial metaphysis. Electronically Signed   By: Burman Nieves M.D.   On: 08/16/2016 05:55  ____________________________________________   PROCEDURES  Procedure(s) performed:   .Splint Application Date/Time: 08/16/2016 6:46 AM Performed by: Loleta RoseFORBACH, Mirel Hundal Authorized by: Loleta RoseFORBACH, Saamir Armstrong   Consent:    Consent obtained:  Verbal Pre-procedure details:    Sensation:  Normal Procedure details:    Laterality:  Left   Location:  Wrist   Wrist:  L wrist   Cast type:  Short arm   Splint type:  Sugar tong   Supplies:  Ortho-Glass Post-procedure details:    Pain:  Unchanged   Sensation:  Normal   Patient tolerance of procedure:  Tolerated well, no immediate complications Comments:     Performed by ED tech     Critical Care performed: No ____________________________________________   INITIAL IMPRESSION / ASSESSMENT AND PLAN / ED COURSE  Pertinent labs & imaging results that were available during my care of the patient were reviewed by me and considered in my medical decision making (see chart for  details).  Neurovascularly intact, soft compartments, splint placed and patient will follow up with orthopedics.  I gave my usual and customary return precautions.      ____________________________________________  FINAL CLINICAL IMPRESSION(S) / ED DIAGNOSES  Final diagnoses:  Closed traumatic nondisplaced fracture of distal end of left radius, initial encounter     MEDICATIONS GIVEN DURING THIS VISIT:  Medications  acetaminophen (TYLENOL) tablet 650 mg (650 mg Oral Given 08/16/16 0633)     NEW OUTPATIENT MEDICATIONS STARTED DURING THIS VISIT:  Discharge Medication List as of 08/16/2016  6:50 AM      Discharge Medication List as of 08/16/2016  6:50 AM      Discharge Medication List as of 08/16/2016  6:50 AM       Note:  This document was prepared using Dragon voice recognition software and may include unintentional dictation errors.    Loleta Roseory Ismaeel Arvelo, MD 08/16/16 [phone]

## 2016-08-17 ENCOUNTER — Telehealth: Payer: Self-pay | Admitting: Emergency Medicine

## 2016-08-17 NOTE — Telephone Encounter (Signed)
-----------------------------------------   11:01 PM on 08/17/2016 -----------------------------------------  The patient contacted Viviano SimasLiz Gannon regarding not receiving a prescription for pain medication for her wrist fracture during her recent visit.  During the patient interview she expects me that she had a few pain pills left from giving birth recently.  That was the reason that I do not provide a prescription at the time.  It is very appropriate that she have additional pain medication for her acute radius fracture.  I wrote a paper prescription for Percocet 5-325, 30 tablets, and left it for her at the triage desk in a sealed envelope along with a letter reminding her to pump her breast milk and discard that milk if possible after she takes the medication, as well as encouraging her to transition to Tylenol as soon as possible.  I also reviewed the patient's prescription history over the last 12 months in the [place] Controlled Substances Database, and she has only the one recent prescription for Percocet corresponding with her recent delivery.

## 2016-10-17 DIAGNOSIS — S52532A Colles' fracture of left radius, initial encounter for closed fracture: Secondary | ICD-10-CM | POA: Insufficient documentation

## 2017-01-16 DIAGNOSIS — T171XXA Foreign body in nostril, initial encounter: Secondary | ICD-10-CM | POA: Diagnosis not present

## 2017-01-16 DIAGNOSIS — H6983 Other specified disorders of Eustachian tube, bilateral: Secondary | ICD-10-CM | POA: Diagnosis not present

## 2017-02-14 DIAGNOSIS — M546 Pain in thoracic spine: Secondary | ICD-10-CM | POA: Diagnosis not present

## 2017-02-14 DIAGNOSIS — M4604 Spinal enthesopathy, thoracic region: Secondary | ICD-10-CM | POA: Diagnosis not present

## 2017-02-14 DIAGNOSIS — M9902 Segmental and somatic dysfunction of thoracic region: Secondary | ICD-10-CM | POA: Diagnosis not present

## 2017-03-28 DIAGNOSIS — M4604 Spinal enthesopathy, thoracic region: Secondary | ICD-10-CM | POA: Diagnosis not present

## 2017-03-28 DIAGNOSIS — M546 Pain in thoracic spine: Secondary | ICD-10-CM | POA: Diagnosis not present

## 2017-03-28 DIAGNOSIS — M9902 Segmental and somatic dysfunction of thoracic region: Secondary | ICD-10-CM | POA: Diagnosis not present

## 2017-05-23 DIAGNOSIS — R599 Enlarged lymph nodes, unspecified: Secondary | ICD-10-CM | POA: Insufficient documentation

## 2017-05-23 DIAGNOSIS — I889 Nonspecific lymphadenitis, unspecified: Secondary | ICD-10-CM | POA: Insufficient documentation

## 2017-05-24 ENCOUNTER — Telehealth: Payer: Self-pay

## 2017-05-24 NOTE — Telephone Encounter (Signed)
Can you order these labs for pt?

## 2017-05-24 NOTE — Telephone Encounter (Signed)
Pt works @[employer]  & is wanting to have her blood drawn to check Lipid Levels. She gets this as a free service. She just needs an order faxed to [phone] Attention Belinda Day. Pt requesting CBC, Lipid Panel, A1C & Vitamin D. Cb#[phone].

## 2017-05-25 ENCOUNTER — Other Ambulatory Visit: Payer: Self-pay | Admitting: Obstetrics & Gynecology

## 2017-05-25 DIAGNOSIS — Z130 Encounter for screening for diseases of the blood and blood-forming organs and certain disorders involving the immune mechanism: Secondary | ICD-10-CM

## 2017-05-25 DIAGNOSIS — Z131 Encounter for screening for diabetes mellitus: Secondary | ICD-10-CM

## 2017-05-25 DIAGNOSIS — Z13220 Encounter for screening for lipoid disorders: Secondary | ICD-10-CM

## 2017-05-25 DIAGNOSIS — Z1321 Encounter for screening for nutritional disorder: Secondary | ICD-10-CM

## 2017-05-25 NOTE — Telephone Encounter (Signed)
Pt called triage line stating she would like to know if a Uric Acid level could be added to [facility]?

## 2017-05-25 NOTE — Telephone Encounter (Signed)
Orders put in, you will have to figure how to fax

## 2017-05-25 NOTE — Telephone Encounter (Signed)
Yes, she wanted to add a lab Uric Acid level ?

## 2017-05-25 NOTE — Telephone Encounter (Signed)
No, and since I havent seen her lately I am already doing a favor and ordering the labs that I would order for annual care; I dont have an indication for Uric Acid

## 2017-05-25 NOTE — Telephone Encounter (Signed)
Is this done? Why is this forwarded back to me?

## 2017-05-25 NOTE — Telephone Encounter (Signed)
Orders faxed left voice message to let pt know.

## 2017-05-26 NOTE — Telephone Encounter (Signed)
lmtrc

## 2017-05-30 ENCOUNTER — Other Ambulatory Visit: Payer: Self-pay

## 2017-05-30 DIAGNOSIS — Z131 Encounter for screening for diabetes mellitus: Secondary | ICD-10-CM

## 2017-05-30 DIAGNOSIS — Z130 Encounter for screening for diseases of the blood and blood-forming organs and certain disorders involving the immune mechanism: Secondary | ICD-10-CM

## 2017-05-30 DIAGNOSIS — Z13220 Encounter for screening for lipoid disorders: Secondary | ICD-10-CM

## 2017-05-30 DIAGNOSIS — Z1321 Encounter for screening for nutritional disorder: Secondary | ICD-10-CM

## 2017-05-31 LAB — LIPID PANEL
Chol/HDL Ratio: 5.7 ratio — ABNORMAL HIGH (ref 0.0–4.4)
Cholesterol, Total: 198 mg/dL (ref 100–199)
HDL: 35 mg/dL — ABNORMAL LOW (ref >39)
LDL Calculated: 92 mg/dL (ref 0–99)
Triglycerides: 356 mg/dL — ABNORMAL HIGH (ref 0–149)
VLDL CHOLESTEROL CAL: 71 mg/dL — AB (ref 5–40)

## 2017-05-31 LAB — CBC WITH DIFFERENTIAL
BASOS ABS: 0 10*3/uL (ref 0.0–0.2)
Basos: 0 %
EOS (ABSOLUTE): 0.2 10*3/uL (ref 0.0–0.4)
Eos: 3 %
HEMOGLOBIN: 12.5 g/dL (ref 11.1–15.9)
Hematocrit: 38.3 % (ref 34.0–46.6)
Immature Grans (Abs): 0 10*3/uL (ref 0.0–0.1)
Immature Granulocytes: 0 %
Lymphocytes Absolute: 2.9 10*3/uL (ref 0.7–3.1)
Lymphs: 38 %
MCH: 27.8 pg (ref 26.6–33.0)
MCHC: 32.6 g/dL (ref 31.5–35.7)
MCV: 85 fL (ref 79–97)
MONOCYTES: 7 %
Monocytes Absolute: 0.5 10*3/uL (ref 0.1–0.9)
NEUTROS ABS: 4.1 10*3/uL (ref 1.4–7.0)
Neutrophils: 52 %
RBC: 4.5 x10E6/uL (ref 3.77–5.28)
RDW: 13.7 % (ref 12.3–15.4)
WBC: 7.8 10*3/uL (ref 3.4–10.8)

## 2017-05-31 LAB — HEMOGLOBIN A1C
Est. average glucose Bld gHb Est-mCnc: 114 mg/dL
Hgb A1c MFr Bld: 5.6 % (ref 4.8–5.6)

## 2017-05-31 LAB — VITAMIN D 25 HYDROXY (VIT D DEFICIENCY, FRACTURES): VIT D 25 HYDROXY: 33.3 ng/mL (ref 30.0–100.0)

## 2017-05-31 NOTE — Progress Notes (Signed)
Sch annual plz

## 2017-06-09 ENCOUNTER — Ambulatory Visit
Admission: RE | Admit: 2017-06-09 | Discharge: 2017-06-09 | Disposition: A | Discharge status: Home / Self Care | Payer: BLUE CROSS/BLUE SHIELD | Source: Ambulatory Visit | Attending: Adult Health | Admitting: Adult Health

## 2017-06-09 ENCOUNTER — Ambulatory Visit: Payer: Self-pay | Admitting: Adult Health

## 2017-06-09 ENCOUNTER — Telehealth: Payer: Self-pay | Admitting: Adult Health

## 2017-06-09 ENCOUNTER — Encounter: Payer: Self-pay | Admitting: Adult Health

## 2017-06-09 VITALS — BP 116/78 | HR 83 | Temp 99.1°F | Wt 264.0 lb

## 2017-06-09 DIAGNOSIS — K760 Fatty (change of) liver, not elsewhere classified: Secondary | ICD-10-CM | POA: Diagnosis not present

## 2017-06-09 DIAGNOSIS — I880 Nonspecific mesenteric lymphadenitis: Secondary | ICD-10-CM

## 2017-06-09 DIAGNOSIS — R110 Nausea: Secondary | ICD-10-CM

## 2017-06-09 DIAGNOSIS — R1011 Right upper quadrant pain: Secondary | ICD-10-CM

## 2017-06-09 DIAGNOSIS — R160 Hepatomegaly, not elsewhere classified: Secondary | ICD-10-CM | POA: Diagnosis not present

## 2017-06-09 LAB — POCT URINALYSIS DIPSTICK
Bilirubin, UA: NEGATIVE
Glucose, UA: NEGATIVE
Ketones, UA: NEGATIVE
NITRITE UA: NEGATIVE
PH UA: 6 (ref 5.0–8.0)
Protein, UA: NEGATIVE
SPEC GRAV UA: 1.015 (ref 1.010–1.025)
UROBILINOGEN UA: 0.2 U/dL

## 2017-06-09 LAB — CBC WITH DIFFERENTIAL/PLATELET
BASOS ABS: 0 10*3/uL (ref 0.0–0.2)
Basos: 0 %
EOS (ABSOLUTE): 0.2 10*3/uL (ref 0.0–0.4)
Eos: 2 %
Hematocrit: 38.6 % (ref 34.0–46.6)
Hemoglobin: 13.2 g/dL (ref 11.1–15.9)
IMMATURE GRANS (ABS): 0 10*3/uL (ref 0.0–0.1)
Immature Granulocytes: 0 %
LYMPHS: 31 %
Lymphocytes Absolute: 2.6 10*3/uL (ref 0.7–3.1)
MCH: 28.1 pg (ref 26.6–33.0)
MCHC: 34.2 g/dL (ref 31.5–35.7)
MCV: 82 fL (ref 79–97)
MONOS ABS: 0.5 10*3/uL (ref 0.1–0.9)
Monocytes: 6 %
NEUTROS ABS: 5 10*3/uL (ref 1.4–7.0)
Neutrophils: 61 %
PLATELETS: 341 10*3/uL (ref 150–379)
RBC: 4.69 x10E6/uL (ref 3.77–5.28)
RDW: 14 % (ref 12.3–15.4)
WBC: 8.3 10*3/uL (ref 3.4–10.8)

## 2017-06-09 LAB — COMPREHENSIVE METABOLIC PANEL
A/G RATIO: 1.3 (ref 1.2–2.2)
ALK PHOS: 70 IU/L (ref 39–117)
ALT: 22 IU/L (ref 0–32)
AST: 34 IU/L (ref 0–40)
Albumin: 4.3 g/dL (ref 3.5–5.5)
BUN/Creatinine Ratio: 17 (ref 9–23)
BUN: 12 mg/dL (ref 6–20)
CHLORIDE: 100 mmol/L (ref 96–106)
CO2: 20 mmol/L (ref 20–29)
Calcium: 9.4 mg/dL (ref 8.7–10.2)
Creatinine, Ser: 0.71 mg/dL (ref 0.57–1.00)
GFR calc Af Amer: 130 mL/min/{1.73_m2} (ref >59)
GFR calc non Af Amer: 113 mL/min/{1.73_m2} (ref >59)
GLUCOSE: 94 mg/dL (ref 65–99)
Globulin, Total: 3.2 g/dL (ref 1.5–4.5)
POTASSIUM: 4.6 mmol/L (ref 3.5–5.2)
Sodium: 140 mmol/L (ref 134–144)
TOTAL PROTEIN: 7.5 g/dL (ref 6.0–8.5)

## 2017-06-09 LAB — LIPASE: LIPASE: 23 U/L (ref 14–72)

## 2017-06-09 LAB — POCT URINE PREGNANCY: PREG TEST UR: NEGATIVE

## 2017-06-09 LAB — C-REACTIVE PROTEIN: CRP: 18 mg/L — AB (ref 0.0–4.9)

## 2017-06-09 LAB — AMYLASE: AMYLASE: 72 U/L (ref 31–124)

## 2017-06-09 MED ORDER — ONDANSETRON HCL 4 MG PO TABS: 4.0000 mg | ORAL_TABLET | Freq: Three times a day (TID) | ORAL | 0 refills | Status: DC | PRN

## 2017-06-09 MED ORDER — IOPAMIDOL (ISOVUE-300) INJECTION 61%: 125.0000 mL | Freq: Once | INTRAVENOUS | Status: DC | PRN

## 2017-06-09 NOTE — Patient Instructions (Signed)

## 2017-06-09 NOTE — Progress Notes (Signed)
Patient ID: Melissa Martinez, female   DOB: Apr 28, 1986, 31 y.o.   MRN: [id number]  See Telephone call for advice given to patient regarding CT results, she will rest and increase fluid. . CRP elevated as well.Patient is aware. Urine culture is pending.  All labs and results reviewed with Supervising Miguel Aschoff MD and plan as documented. Patient will go to the ER if any symptoms change or worsen, will be seen in office in 06/12/17 by provider in clinic. Provider will evaluate and send to referral to GI if needed.   Patient verbalized all instructions and verbalized she will have someone drive her to the ER if needed.   Recent Results (from the past 2160 hour(s))  CBC With Differential     Status: None   Collection Time: 05/30/17 12:00 AM  Result Value Ref Range   WBC 7.8 3.4 - 10.8 x10E3/uL   RBC 4.50 3.77 - 5.28 x10E6/uL   Hemoglobin 12.5 11.1 - 15.9 g/dL   Hematocrit 38.3 34.0 - 46.6 %   MCV 85 79 - 97 fL   MCH 27.8 26.6 - 33.0 pg   MCHC 32.6 31.5 - 35.7 g/dL   RDW 13.7 12.3 - 15.4 %   Neutrophils 52 Not Estab. %   Lymphs 38 Not Estab. %   Monocytes 7 Not Estab. %   Eos 3 Not Estab. %   Basos 0 Not Estab. %   Neutrophils Absolute 4.1 1.4 - 7.0 x10E3/uL   Lymphocytes Absolute 2.9 0.7 - 3.1 x10E3/uL   Monocytes Absolute 0.5 0.1 - 0.9 x10E3/uL   EOS (ABSOLUTE) 0.2 0.0 - 0.4 x10E3/uL   Basophils Absolute 0.0 0.0 - 0.2 x10E3/uL   Immature Granulocytes 0 Not Estab. %   Immature Grans (Abs) 0.0 0.0 - 0.1 x10E3/uL  VITAMIN D 25 Hydroxy (Vit-D Deficiency, Fractures)     Status: None   Collection Time: 05/30/17 12:00 AM  Result Value Ref Range   Vit D, 25-Hydroxy 33.3 30.0 - 100.0 ng/mL    Comment: Vitamin D deficiency has been defined by the Institute of Medicine and an Endocrine Society practice guideline as a level of serum 25-OH vitamin D less than 20 ng/mL (1,2). The Endocrine Society went on to further define vitamin D insufficiency as a level between 21 and 29 ng/mL (2). 1. IOM  (Institute of Medicine). 2010. Dietary reference    intakes for calcium and D. [place]: The    [employer]. 2. Holick MF, Binkley [place], Bischoff-Ferrari HA, et al.    Evaluation, treatment, and prevention of vitamin D    deficiency: an Endocrine Society clinical practice    guideline. JCEM. 2011 Jul; 96(7):1911-30.   Lipid panel     Status: Abnormal   Collection Time: 05/30/17 12:00 AM  Result Value Ref Range   Cholesterol, Total 198 100 - 199 mg/dL   Triglycerides 356 (H) 0 - 149 mg/dL   HDL 35 (L) >39 mg/dL   VLDL Cholesterol Cal 71 (H) 5 - 40 mg/dL   LDL Calculated 92 0 - 99 mg/dL   Chol/HDL Ratio 5.7 (H) 0.0 - 4.4 ratio    Comment:                                   T. Chol/HDL Ratio  Men  Women                               1/2 Avg.Risk  3.4    3.3                                   Avg.Risk  5.0    4.4                                2X Avg.Risk  9.6    7.1                                3X Avg.Risk 23.4   11.0   Hemoglobin A1c     Status: None   Collection Time: 05/30/17 12:00 AM  Result Value Ref Range   Hgb A1c MFr Bld 5.6 4.8 - 5.6 %    Comment:          Pre-diabetes: 5.7 - 6.4          Diabetes: >6.4          Glycemic control for adults with diabetes: <7.0    Est. average glucose Bld gHb Est-mCnc 114 mg/dL  Lipase     Status: None   Collection Time: 06/09/17 11:32 AM  Result Value Ref Range   Lipase 23 14 - 72 U/L  Amylase     Status: None   Collection Time: 06/09/17 11:32 AM  Result Value Ref Range   Amylase 72 31 - 124 U/L  CBC w/Diff     Status: None   Collection Time: 06/09/17 11:32 AM  Result Value Ref Range   WBC 8.3 3.4 - 10.8 x10E3/uL   RBC 4.69 3.77 - 5.28 x10E6/uL   Hemoglobin 13.2 11.1 - 15.9 g/dL   Hematocrit 38.6 34.0 - 46.6 %   MCV 82 79 - 97 fL   MCH 28.1 26.6 - 33.0 pg   MCHC 34.2 31.5 - 35.7 g/dL   RDW 14.0 12.3 - 15.4 %   Platelets 341 150 - 379 x10E3/uL   Neutrophils 61 Not  Estab. %   Lymphs 31 Not Estab. %   Monocytes 6 Not Estab. %   Eos 2 Not Estab. %   Basos 0 Not Estab. %   Neutrophils Absolute 5.0 1.4 - 7.0 x10E3/uL   Lymphocytes Absolute 2.6 0.7 - 3.1 x10E3/uL   Monocytes Absolute 0.5 0.1 - 0.9 x10E3/uL   EOS (ABSOLUTE) 0.2 0.0 - 0.4 x10E3/uL   Basophils Absolute 0.0 0.0 - 0.2 x10E3/uL   Immature Granulocytes 0 Not Estab. %   Immature Grans (Abs) 0.0 0.0 - 0.1 x10E3/uL  Comp Met (CMET)     Status: None   Collection Time: 06/09/17 11:32 AM  Result Value Ref Range   Glucose 94 65 - 99 mg/dL   BUN 12 6 - 20 mg/dL   Creatinine, Ser 0.71 0.57 - 1.00 mg/dL   GFR calc non Af Amer 113 >59 mL/min/1.73   GFR calc Af Amer 130 >59 mL/min/1.73   BUN/Creatinine Ratio 17 9 - 23   Sodium 140 134 - 144 mmol/L   Potassium 4.6 3.5 - 5.2 mmol/L   Chloride 100 96 - 106 mmol/L   CO2 20 20 - 29 mmol/L  Calcium 9.4 8.7 - 10.2 mg/dL   Total Protein 7.5 6.0 - 8.5 g/dL   Albumin 4.3 3.5 - 5.5 g/dL   Globulin, Total 3.2 1.5 - 4.5 g/dL   Albumin/Globulin Ratio 1.3 1.2 - 2.2   Bilirubin Total <0.2 0.0 - 1.2 mg/dL   Alkaline Phosphatase 70 39 - 117 IU/L   AST 34 0 - 40 IU/L   ALT 22 0 - 32 IU/L  C-reactive protein     Status: Abnormal   Collection Time: 06/09/17 11:32 AM  Result Value Ref Range   CRP 18.0 (H) 0.0 - 4.9 mg/L  POCT urine pregnancy     Status: Normal   Collection Time: 06/09/17 12:44 PM  Result Value Ref Range   Preg Test, Ur Negative Negative  POCT urinalysis dipstick     Status: Abnormal   Collection Time: 06/09/17 12:47 PM  Result Value Ref Range   Color, UA LT YELLOW    Clarity, UA CLEAR    Glucose, UA NEG    Bilirubin, UA NEG    Ketones, UA NEG    Spec Grav, UA 1.015 1.010 - 1.025   Blood, UA TR    pH, UA 6.0 5.0 - 8.0   Protein, UA NEG    Urobilinogen, UA 0.2 0.2 or 1.0 E.U./dL   Nitrite, UA NEG    Leukocytes, UA Trace (A) Negative

## 2017-06-09 NOTE — Progress Notes (Signed)
Subjective:     Patient ID: Melissa Martinez, female   DOB: 1985/11/30, 31 y.o.   MRN: [id number]  HPI Patient is a 31 year old female who presents in no acute distress  with complaints of  with intermittent nausea for the past 3 to 4 weeks. She reports that her nausea has increased in the past four days. She has dizziness intermittently around two times a day per her report that resolves within a minute to two minutes usually. Last night she reports the dizziness lasted a " little longer " but did resolve.  Nausea worse with eating per her report.  Mild headache intermittently per her report. Denies any vision changes.   She reports vomiting x 1 episode last night and denies nay blood or dark tarry color.  She does have diarrhea with this but she reports she has seen GI for this and was treated with diet changes for irritable bowel syndromeand She reports diarrhea two times daily is normal for her. She denies any dark tarry stools, mucous or blood.   She  Has been eating more often during this nausea as she thought maybe her blood sugar was low.. She is on Metformin 500mg  maintenace  Dose  since 2009. She was previously on Metformin 1000 mg, dose was lowered.   LMP was 05/31/17  And she reports only spotting x 1 day. She has PCOS. In  May she had what is normal for her a regular menstrual cycle with 2 to 3 days blood flow. .She had a  c -section Aug 2017.She is not trying to conceive currently at the advice of her OBGYN she is  waiting for 18 months.   July 26th  Triglycerides were 355 per patients report.   Current labs show  at  Triglycerides are 356 She has OBGYN  monitoring her tryglicerides per her report.   She does report as history of Rectal bleeding to h banding - 2 1/2 years ago. Denies any bleeding since.   Patient does report increased fatigue, though she attributes this to her 32 year old child not sleeping through the night.   She denies any other signs or symptoms currently. She does  report a history of kidney stones.    .meds Vitals:   06/09/17 0947  BP: 116/78  Pulse: 83  Temp: 99.1 F (37.3 C)    Gallbladder removed 2010  D& C 2013 2014 kidney stone removed 2015 hysteroscopy  She denies any other surgeries.   Review of Systems  Constitutional: Positive for fatigue (tired but has one year old who is not sleeping. ). Negative for activity change, appetite change, chills, diaphoresis, fever and unexpected weight change.  HENT: Positive for rhinorrhea. Negative for congestion, dental problem, drooling, ear discharge, ear pain, facial swelling, hearing loss, mouth sores, nosebleeds, postnasal drip, sinus pain, sinus pressure, sneezing, sore throat, tinnitus, trouble swallowing and voice change.   Eyes: Negative.   Respiratory: Negative.   Cardiovascular: Negative for chest pain, palpitations and leg swelling.  Gastrointestinal: Positive for abdominal distention (with eating ), diarrhea (2 x day), nausea (lasting four weeks/ decribes as mostly moderaate but does increase to severe nausea at times.) and vomiting (x 1). Negative for abdominal pain, anal bleeding, blood in stool, constipation and rectal pain.  Endocrine: Negative.   Genitourinary: Negative.   Musculoskeletal: Negative.   Skin: Negative.   Allergic/Immunologic: Negative for environmental allergies, food allergies and immunocompromised state.  Neurological: Positive for dizziness ( two times per day lasts  seconds was one and half hours a few night ago. ). Negative for tremors, seizures, syncope, facial asymmetry, speech difficulty, weakness, light-headedness, numbness and headaches (mild occasionally per patient).  Hematological: Negative.   Psychiatric/Behavioral: Negative.           Physical Exam  Constitutional: She is oriented to person, place, and time. She appears well-developed and well-nourished. No distress.  HENT:  Head: Normocephalic and atraumatic.  Right Ear: External ear normal.   Left Ear: External ear normal.  Nose: Nose normal.  Mouth/Throat: Oropharynx is clear and moist. No oropharyngeal exudate.  Eyes: Pupils are equal, round, and reactive to light. Conjunctivae and EOM are normal. Right eye exhibits no discharge. Left eye exhibits no discharge. No scleral icterus.  Neck: Normal range of motion. Neck supple. No JVD present. No tracheal deviation present. No thyromegaly present.  Cardiovascular: Normal rate, regular rhythm, normal heart sounds and intact distal pulses.  Exam reveals no gallop and no friction rub.   No murmur heard. Pulmonary/Chest: Effort normal and breath sounds normal. No stridor. No respiratory distress. She has no wheezes. She has no rales. She exhibits no tenderness.  Abdominal: Soft. Normal appearance and bowel sounds are normal. She exhibits no distension and no mass. There is tenderness in the right upper quadrant and right lower quadrant. There is no rigidity, no rebound, no guarding, no CVA tenderness, no tenderness at McBurney's point and negative Murphy's sign.  Obese abdomen   Musculoskeletal: Normal range of motion. She exhibits no edema, tenderness or deformity.  Lymphadenopathy:    She has no cervical adenopathy.  Neurological: She is alert and oriented to person, place, and time. She has normal reflexes. She displays normal reflexes. No cranial nerve deficit. She exhibits normal muscle tone. Coordination normal.  Skin: Skin is warm and dry. No rash noted. She is not diaphoretic. No erythema. No pallor.  Psychiatric: She has a normal mood and affect. Her behavior is normal. Judgment and thought content normal.  Vitals reviewed.      Assessment:     1. Nausea/ Right upper quadrant pain with palpation.   2. Urine pregnancy negative    Plan:     1. CT SCAN with contrast ordered URGENT.     2. Nausea :   Zofran 4 mg tablets take one by mouth every 8 hours as needed for nausea. She will pick up after CT scan.  E  Prescribed. Nicola Girtebra Tobias MA called and  Verified patient is not breastfeeding as warning when prescribing Zofran for nausea.     3. Labs : CBC with diff, amylase, lipase, CRP and CMET   Patient advised provider will call with results to labs and CT Scan.  Patient advised to go to the Emergency room at any time if symptoms persist, change or worsen. Patient verbalized understanding of all of the above.

## 2017-06-09 NOTE — Telephone Encounter (Signed)
Called patient on 06/09/17  4:15 with abnormal CT results, elevated CRP and discussed with Dr. Julieanne Mansonichard Gilbert and advised increased fluids and rest and follow up on 06/12/17 with clinic. Temperature in clinic was 99.1 tympanic and patient advised if any oral temperature over 101 F, or any increase or change in symptoms or if she feels worse or symptoms fail to improve  or changeto have someone drive her and go to the Emergency room Patient denies any change in symptoms since previous visit in clinic earlier today. She verbalized instructions documented above. Dr. Sullivan LoneGilbert recommended recheck on 06/12/17 for evaluation and referral to GI if needed. Nicola Girtebra Tobias MA scheduled follow up on 06/12/17, patient is aware.

## 2017-06-11 ENCOUNTER — Encounter: Payer: Self-pay | Admitting: Emergency Medicine

## 2017-06-11 DIAGNOSIS — Z9889 Other specified postprocedural states: Secondary | ICD-10-CM | POA: Diagnosis not present

## 2017-06-11 DIAGNOSIS — E669 Obesity, unspecified: Secondary | ICD-10-CM | POA: Diagnosis present

## 2017-06-11 DIAGNOSIS — E860 Dehydration: Secondary | ICD-10-CM | POA: Diagnosis present

## 2017-06-11 DIAGNOSIS — Z6839 Body mass index (BMI) 39.0-39.9, adult: Secondary | ICD-10-CM | POA: Diagnosis not present

## 2017-06-11 DIAGNOSIS — Z8249 Family history of ischemic heart disease and other diseases of the circulatory system: Secondary | ICD-10-CM

## 2017-06-11 DIAGNOSIS — R000 Tachycardia, unspecified: Secondary | ICD-10-CM | POA: Diagnosis not present

## 2017-06-11 DIAGNOSIS — Z87442 Personal history of urinary calculi: Secondary | ICD-10-CM

## 2017-06-11 DIAGNOSIS — Z79899 Other long term (current) drug therapy: Secondary | ICD-10-CM | POA: Diagnosis not present

## 2017-06-11 DIAGNOSIS — Z833 Family history of diabetes mellitus: Secondary | ICD-10-CM

## 2017-06-11 DIAGNOSIS — A0811 Acute gastroenteropathy due to Norwalk agent: Secondary | ICD-10-CM | POA: Diagnosis not present

## 2017-06-11 DIAGNOSIS — Z7984 Long term (current) use of oral hypoglycemic drugs: Secondary | ICD-10-CM

## 2017-06-11 DIAGNOSIS — E282 Polycystic ovarian syndrome: Secondary | ICD-10-CM | POA: Diagnosis present

## 2017-06-11 DIAGNOSIS — E872 Acidosis: Secondary | ICD-10-CM | POA: Diagnosis not present

## 2017-06-11 DIAGNOSIS — E876 Hypokalemia: Secondary | ICD-10-CM | POA: Diagnosis present

## 2017-06-11 DIAGNOSIS — F329 Major depressive disorder, single episode, unspecified: Secondary | ICD-10-CM | POA: Diagnosis present

## 2017-06-11 DIAGNOSIS — Z803 Family history of malignant neoplasm of breast: Secondary | ICD-10-CM | POA: Diagnosis not present

## 2017-06-11 DIAGNOSIS — Z888 Allergy status to other drugs, medicaments and biological substances status: Secondary | ICD-10-CM

## 2017-06-11 DIAGNOSIS — K529 Noninfective gastroenteritis and colitis, unspecified: Secondary | ICD-10-CM | POA: Diagnosis not present

## 2017-06-11 DIAGNOSIS — F419 Anxiety disorder, unspecified: Secondary | ICD-10-CM | POA: Diagnosis present

## 2017-06-11 DIAGNOSIS — Z8052 Family history of malignant neoplasm of bladder: Secondary | ICD-10-CM | POA: Diagnosis not present

## 2017-06-11 DIAGNOSIS — K920 Hematemesis: Secondary | ICD-10-CM | POA: Diagnosis present

## 2017-06-11 DIAGNOSIS — Z882 Allergy status to sulfonamides status: Secondary | ICD-10-CM

## 2017-06-11 DIAGNOSIS — Z9049 Acquired absence of other specified parts of digestive tract: Secondary | ICD-10-CM | POA: Diagnosis not present

## 2017-06-11 DIAGNOSIS — R112 Nausea with vomiting, unspecified: Secondary | ICD-10-CM | POA: Diagnosis not present

## 2017-06-11 DIAGNOSIS — Z8349 Family history of other endocrine, nutritional and metabolic diseases: Secondary | ICD-10-CM | POA: Diagnosis not present

## 2017-06-12 ENCOUNTER — Inpatient Hospital Stay
Admission: EM | Admit: 2017-06-12 | Discharge: 2017-06-13 | DRG: 392 | Disposition: A | Discharge status: Home / Self Care | Payer: BLUE CROSS/BLUE SHIELD | Attending: Internal Medicine | Admitting: Internal Medicine

## 2017-06-12 ENCOUNTER — Ambulatory Visit: Payer: Self-pay | Admitting: Medical

## 2017-06-12 DIAGNOSIS — K920 Hematemesis: Secondary | ICD-10-CM | POA: Diagnosis present

## 2017-06-12 DIAGNOSIS — Z803 Family history of malignant neoplasm of breast: Secondary | ICD-10-CM | POA: Diagnosis not present

## 2017-06-12 DIAGNOSIS — Z8052 Family history of malignant neoplasm of bladder: Secondary | ICD-10-CM | POA: Diagnosis not present

## 2017-06-12 DIAGNOSIS — Z833 Family history of diabetes mellitus: Secondary | ICD-10-CM | POA: Diagnosis not present

## 2017-06-12 DIAGNOSIS — Z7984 Long term (current) use of oral hypoglycemic drugs: Secondary | ICD-10-CM | POA: Diagnosis not present

## 2017-06-12 DIAGNOSIS — Z882 Allergy status to sulfonamides status: Secondary | ICD-10-CM | POA: Diagnosis not present

## 2017-06-12 DIAGNOSIS — E282 Polycystic ovarian syndrome: Secondary | ICD-10-CM | POA: Diagnosis present

## 2017-06-12 DIAGNOSIS — R000 Tachycardia, unspecified: Secondary | ICD-10-CM | POA: Diagnosis present

## 2017-06-12 DIAGNOSIS — F329 Major depressive disorder, single episode, unspecified: Secondary | ICD-10-CM | POA: Diagnosis present

## 2017-06-12 DIAGNOSIS — K529 Noninfective gastroenteritis and colitis, unspecified: Secondary | ICD-10-CM | POA: Diagnosis not present

## 2017-06-12 DIAGNOSIS — Z9889 Other specified postprocedural states: Secondary | ICD-10-CM | POA: Diagnosis not present

## 2017-06-12 DIAGNOSIS — F419 Anxiety disorder, unspecified: Secondary | ICD-10-CM | POA: Diagnosis not present

## 2017-06-12 DIAGNOSIS — E860 Dehydration: Secondary | ICD-10-CM | POA: Diagnosis present

## 2017-06-12 DIAGNOSIS — Z888 Allergy status to other drugs, medicaments and biological substances status: Secondary | ICD-10-CM | POA: Diagnosis not present

## 2017-06-12 DIAGNOSIS — Z8249 Family history of ischemic heart disease and other diseases of the circulatory system: Secondary | ICD-10-CM | POA: Diagnosis not present

## 2017-06-12 DIAGNOSIS — Z8349 Family history of other endocrine, nutritional and metabolic diseases: Secondary | ICD-10-CM | POA: Diagnosis not present

## 2017-06-12 DIAGNOSIS — E876 Hypokalemia: Secondary | ICD-10-CM | POA: Diagnosis present

## 2017-06-12 DIAGNOSIS — Z87442 Personal history of urinary calculi: Secondary | ICD-10-CM | POA: Diagnosis not present

## 2017-06-12 DIAGNOSIS — R112 Nausea with vomiting, unspecified: Secondary | ICD-10-CM | POA: Diagnosis not present

## 2017-06-12 DIAGNOSIS — E669 Obesity, unspecified: Secondary | ICD-10-CM | POA: Diagnosis present

## 2017-06-12 DIAGNOSIS — A0811 Acute gastroenteropathy due to Norwalk agent: Secondary | ICD-10-CM | POA: Diagnosis present

## 2017-06-12 DIAGNOSIS — Z6839 Body mass index (BMI) 39.0-39.9, adult: Secondary | ICD-10-CM | POA: Diagnosis not present

## 2017-06-12 DIAGNOSIS — Z9049 Acquired absence of other specified parts of digestive tract: Secondary | ICD-10-CM | POA: Diagnosis not present

## 2017-06-12 DIAGNOSIS — Z79899 Other long term (current) drug therapy: Secondary | ICD-10-CM | POA: Diagnosis not present

## 2017-06-12 DIAGNOSIS — E872 Acidosis, unspecified: Secondary | ICD-10-CM

## 2017-06-12 DIAGNOSIS — R197 Diarrhea, unspecified: Secondary | ICD-10-CM

## 2017-06-12 LAB — COMPREHENSIVE METABOLIC PANEL
ALBUMIN: 4.3 g/dL (ref 3.5–5.0)
ALK PHOS: 72 U/L (ref 38–126)
ALT: 34 U/L (ref 14–54)
ANION GAP: 14 (ref 5–15)
AST: 60 U/L — ABNORMAL HIGH (ref 15–41)
BILIRUBIN TOTAL: 0.6 mg/dL (ref 0.3–1.2)
BUN: 13 mg/dL (ref 6–20)
CALCIUM: 9.6 mg/dL (ref 8.9–10.3)
CO2: 21 mmol/L — ABNORMAL LOW (ref 22–32)
Chloride: 106 mmol/L (ref 101–111)
Creatinine, Ser: 0.84 mg/dL (ref 0.44–1.00)
GFR calc Af Amer: >60 mL/min (ref >60)
GLUCOSE: 127 mg/dL — AB (ref 65–99)
POTASSIUM: 4.3 mmol/L (ref 3.5–5.1)
Sodium: 141 mmol/L (ref 135–145)
TOTAL PROTEIN: 8.8 g/dL — AB (ref 6.5–8.1)

## 2017-06-12 LAB — GASTROINTESTINAL PANEL BY PCR, STOOL (REPLACES STOOL CULTURE)
Adenovirus F40/41: NOT DETECTED
Astrovirus: NOT DETECTED
CRYPTOSPORIDIUM: NOT DETECTED
CYCLOSPORA CAYETANENSIS: NOT DETECTED
Campylobacter species: NOT DETECTED
ENTAMOEBA HISTOLYTICA: NOT DETECTED
Enteroaggregative E coli (EAEC): NOT DETECTED
Enteropathogenic E coli (EPEC): NOT DETECTED
Enterotoxigenic E coli (ETEC): NOT DETECTED
Giardia lamblia: NOT DETECTED
Norovirus GI/GII: DETECTED — AB
Plesimonas shigelloides: NOT DETECTED
Rotavirus A: NOT DETECTED
SALMONELLA SPECIES: NOT DETECTED
SAPOVIRUS (I, II, IV, AND V): NOT DETECTED
SHIGA LIKE TOXIN PRODUCING E COLI (STEC): NOT DETECTED
SHIGELLA/ENTEROINVASIVE E COLI (EIEC): NOT DETECTED
VIBRIO CHOLERAE: NOT DETECTED
VIBRIO SPECIES: NOT DETECTED
YERSINIA ENTEROCOLITICA: NOT DETECTED

## 2017-06-12 LAB — LACTIC ACID, PLASMA
Lactic Acid, Venous: 2.9 mmol/L (ref 0.5–1.9)
Lactic Acid, Venous: 2.7 mmol/L (ref 0.5–1.9)

## 2017-06-12 LAB — URINALYSIS, COMPLETE (UACMP) WITH MICROSCOPIC
BILIRUBIN URINE: NEGATIVE
GLUCOSE, UA: NEGATIVE mg/dL
HGB URINE DIPSTICK: NEGATIVE
Ketones, ur: 20 mg/dL — AB
LEUKOCYTES UA: NEGATIVE
NITRITE: NEGATIVE
Protein, ur: NEGATIVE mg/dL
SPECIFIC GRAVITY, URINE: 1.019 (ref 1.005–1.030)
pH: 5 (ref 5.0–8.0)

## 2017-06-12 LAB — CBC
HCT: 43.5 % (ref 35.0–47.0)
HEMOGLOBIN: 14.8 g/dL (ref 12.0–16.0)
MCH: 28.2 pg (ref 26.0–34.0)
MCHC: 33.9 g/dL (ref 32.0–36.0)
MCV: 83.2 fL (ref 80.0–100.0)
Platelets: 313 10*3/uL (ref 150–440)
RBC: 5.23 MIL/uL — ABNORMAL HIGH (ref 3.80–5.20)
RDW: 13.3 % (ref 11.5–14.5)
WBC: 11.5 10*3/uL — AB (ref 3.6–11.0)

## 2017-06-12 LAB — GLUCOSE, CAPILLARY
Glucose-Capillary: 129 mg/dL — ABNORMAL HIGH (ref 65–99)
Glucose-Capillary: 110 mg/dL — ABNORMAL HIGH (ref 65–99)
Glucose-Capillary: 116 mg/dL — ABNORMAL HIGH (ref 65–99)
Glucose-Capillary: 99 mg/dL (ref 65–99)

## 2017-06-12 LAB — TSH: TSH: 1.01 u[IU]/mL (ref 0.350–4.500)

## 2017-06-12 LAB — HCG, QUANTITATIVE, PREGNANCY: hCG, Beta Chain, Quant, S: <1 m[IU]/mL (ref <5)

## 2017-06-12 LAB — LIPASE, BLOOD: Lipase: 22 U/L (ref 11–51)

## 2017-06-12 MED ORDER — IBUPROFEN 400 MG PO TABS: 600.0000 mg | ORAL_TABLET | Freq: Four times a day (QID) | ORAL | Status: DC | PRN

## 2017-06-12 MED ORDER — PROMETHAZINE HCL 25 MG/ML IJ SOLN
12.5000 mg | Freq: Once | INTRAMUSCULAR | Status: AC
  Administered 2017-06-12: 12.5 mg via INTRAVENOUS
  Filled 2017-06-12: qty 1

## 2017-06-12 MED ORDER — PROCHLORPERAZINE EDISYLATE 5 MG/ML IJ SOLN
10.0000 mg | Freq: Four times a day (QID) | INTRAMUSCULAR | Status: DC | PRN
  Administered 2017-06-12: 11:00:00 10 mg via INTRAVENOUS
  Filled 2017-06-12 (×2): qty 2

## 2017-06-12 MED ORDER — ONDANSETRON HCL 4 MG/2ML IJ SOLN: 4.0000 mg | Freq: Four times a day (QID) | INTRAMUSCULAR | Status: DC | PRN

## 2017-06-12 MED ORDER — DOCUSATE SODIUM 100 MG PO CAPS
100.0000 mg | ORAL_CAPSULE | Freq: Two times a day (BID) | ORAL | Status: DC
  Administered 2017-06-12: 100 mg via ORAL
  Filled 2017-06-12: qty 1

## 2017-06-12 MED ORDER — BUTALBITAL-APAP-CAFFEINE 50-325-40 MG PO TABS
2.0000 | ORAL_TABLET | Freq: Once | ORAL | Status: AC
  Administered 2017-06-12: 2 via ORAL
  Filled 2017-06-12: qty 2

## 2017-06-12 MED ORDER — FLUOXETINE HCL 10 MG PO CAPS
30.0000 mg | ORAL_CAPSULE | Freq: Every day | ORAL | Status: DC
  Administered 2017-06-12: 11:00:00 30 mg via ORAL
  Filled 2017-06-12: qty 3

## 2017-06-12 MED ORDER — INSULIN ASPART 100 UNIT/ML ~~LOC~~ SOLN
0.0000 [IU] | Freq: Three times a day (TID) | SUBCUTANEOUS | Status: DC
  Administered 2017-06-12: 1 [IU] via SUBCUTANEOUS
  Filled 2017-06-12: qty 1

## 2017-06-12 MED ORDER — DROSPIRENONE-ETHINYL ESTRADIOL 3-0.02 MG PO TABS
1.0000 | ORAL_TABLET | Freq: Every day | ORAL | Status: DC
  Administered 2017-06-12: 21:00:00 1 via ORAL
  Filled 2017-06-12: qty 1

## 2017-06-12 MED ORDER — SODIUM CHLORIDE 0.9 % IV BOLUS (SEPSIS)
1000.0000 mL | Freq: Once | INTRAVENOUS | Status: AC
  Administered 2017-06-12: 1000 mL via INTRAVENOUS

## 2017-06-12 MED ORDER — FLUOXETINE HCL 20 MG PO CAPS
20.0000 mg | ORAL_CAPSULE | Freq: Every day | ORAL | Status: DC
  Administered 2017-06-13: 08:00:00 20 mg via ORAL
  Filled 2017-06-12: qty 1

## 2017-06-12 MED ORDER — ACETAMINOPHEN 325 MG PO TABS
650.0000 mg | ORAL_TABLET | Freq: Four times a day (QID) | ORAL | Status: DC | PRN
  Administered 2017-06-12 – 2017-06-13 (×2): 650 mg via ORAL
  Filled 2017-06-12 (×2): qty 2

## 2017-06-12 MED ORDER — ENOXAPARIN SODIUM 40 MG/0.4ML ~~LOC~~ SOLN
40.0000 mg | SUBCUTANEOUS | Status: DC
  Administered 2017-06-12: 21:00:00 40 mg via SUBCUTANEOUS
  Filled 2017-06-12: qty 0.4

## 2017-06-12 MED ORDER — SODIUM CHLORIDE 0.9 % IV SOLN
INTRAVENOUS | Status: DC
  Administered 2017-06-12 – 2017-06-13 (×4): via INTRAVENOUS

## 2017-06-12 MED ORDER — ONDANSETRON HCL 4 MG PO TABS: 4.0000 mg | ORAL_TABLET | Freq: Four times a day (QID) | ORAL | Status: DC | PRN

## 2017-06-12 MED ORDER — VITAMIN D 1000 UNITS PO TABS
2000.0000 [IU] | ORAL_TABLET | Freq: Every day | ORAL | Status: DC
  Administered 2017-06-12 – 2017-06-13 (×2): 2000 [IU] via ORAL
  Filled 2017-06-12 (×2): qty 2

## 2017-06-12 MED ORDER — ONDANSETRON HCL 4 MG/2ML IJ SOLN
INTRAMUSCULAR | Status: AC
  Filled 2017-06-12: qty 2

## 2017-06-12 MED ORDER — ACETAMINOPHEN 650 MG RE SUPP: 650.0000 mg | Freq: Four times a day (QID) | RECTAL | Status: DC | PRN

## 2017-06-12 MED ORDER — ONDANSETRON HCL 4 MG/2ML IJ SOLN
4.0000 mg | Freq: Once | INTRAMUSCULAR | Status: AC
  Administered 2017-06-12: 4 mg via INTRAVENOUS
  Filled 2017-06-12: qty 2

## 2017-06-12 MED ORDER — ONDANSETRON HCL 4 MG/2ML IJ SOLN
4.0000 mg | Freq: Once | INTRAMUSCULAR | Status: AC
Start: 2017-06-12 — End: 2017-06-12
  Administered 2017-06-12: 4 mg via INTRAVENOUS

## 2017-06-12 MED ORDER — KETOROLAC TROMETHAMINE 30 MG/ML IJ SOLN
30.0000 mg | Freq: Once | INTRAMUSCULAR | Status: AC
  Administered 2017-06-12: 30 mg via INTRAVENOUS
  Filled 2017-06-12: qty 1

## 2017-06-12 MED ORDER — INSULIN ASPART 100 UNIT/ML ~~LOC~~ SOLN: 0.0000 [IU] | Freq: Every day | SUBCUTANEOUS | Status: DC

## 2017-06-12 NOTE — H&P (Signed)
Melissa Martinez is an 31 y.o. female.   Chief Complaint: Vomiting HPI: The patient with past medical history of PCOS, functional abdominal pain and anxiety presents to the emergency department complaining of nausea and vomiting. The patient states her symptoms have been present for possibly 4 days. She visited her primary care doctor 3 days ago and obtained lab work that was reassuring at the time. However she has continued to have nonbloody nonbilious emesis and some loose stool. Her husband is sick and her young daughter has had 1 day of similar symptoms. She is not traveled out of the country or been on a cruise. She denies fever but admits to abdominal pain. The patient received 3 L of fluid in the emergency department but continued to have tachycardia as well as uncontrolled nausea and vomiting which prompted the emergency department staff to call the hospitalist service for admission.  Past Medical History:  Diagnosis Date  . Abdominal pain, generalized 11/11/2014  . Altered bowel function 11/11/2014  . Anxiety    tkes Prozac, for OCD related anxiety  . Bleeding per rectum 11/11/2014  . Calculus of kidney 12/14/2015  . Complication of anesthesia   . Fatty infiltration of liver 11/11/2014  . PCOS (polycystic ovarian syndrome)   . Upper airway cough syndrome 08/17/2015   Followed in Pulmonary clinic/ [facility]/ [place]      Past Surgical History:  Procedure Laterality Date  . CESAREAN SECTION N/A 06/22/2016   Procedure: CESAREAN SECTION;  Surgeon: Gae Dry, MD;  Location: [facility] ORS;  Service: Obstetrics;  Laterality: N/A;  . CHOLECYSTECTOMY    . COLONOSCOPY  09/2014  . LAPAROSCOPIC CHOLECYSTECTOMY  06/2009  . STENT PLACE LEFT URETER ([place] HX) Left 11/2012   has had 2 stones pass stones    Family History  Problem Relation Age of Onset  . Heart disease Father   . Hyperlipidemia Father   . Breast cancer Maternal Grandmother   . Diabetes Maternal Grandmother   . Bladder  Cancer Maternal Grandfather   . Heart disease Maternal Grandfather   . Kidney Stones Mother   . Hypertension Mother   . Hypertension Brother    Social History:  reports that she has never smoked. She has never used smokeless tobacco. She reports that she drinks alcohol. She reports that she does not use drugs.  Allergies:  Allergies  Allergen Reactions  . Levofloxacin Itching and Other (See Comments)    Throat tightening, generalized itching, redness  . Reglan [Metoclopramide] Anxiety    Causes paranoia   . Bactrim [Sulfamethoxazole-Trimethoprim] Itching    Also redness  . Equal [Aspartame] Itching  . Levaquin [Levofloxacin In D5w] Other (See Comments)    Throat tightening, generalized itching, redness  . Sulfa Antibiotics     Medications Prior to Admission  Medication Sig Dispense Refill  . cetirizine (ZYRTEC) 10 MG tablet Take 10 mg by mouth daily.    . Cholecalciferol (VITAMIN D3) 2000 UNITS capsule Take 4,000 Units by mouth daily.     Marland Kitchen FLUoxetine (PROZAC) 10 MG tablet Take 20 mg by mouth at bedtime.   2  . GIANVI 3-0.02 MG tablet Take 1 tablet by mouth daily.   2  . metFORMIN (GLUCOPHAGE-XR) 500 MG 24 hr tablet Take 500 mg by mouth daily.    . ondansetron (ZOFRAN) 4 MG tablet Take 1 tablet (4 mg total) by mouth every 8 (eight) hours as needed for nausea (may cause drowsiness). 20 tablet 0  . Prenatal Vit-Fe Fumarate-FA (  PRENATAL VITAMIN PO) Take 1 tablet by mouth daily.      Results for orders placed or performed during the hospital encounter of 06/12/17 (from the past 48 hour(s))  Lipase, blood     Status: None   Collection Time: 06/12/17 12:24 AM  Result Value Ref Range   Lipase 22 11 - 51 U/L  Comprehensive metabolic panel     Status: Abnormal   Collection Time: 06/12/17 12:24 AM  Result Value Ref Range   Sodium 141 135 - 145 mmol/L   Potassium 4.3 3.5 - 5.1 mmol/L   Chloride 106 101 - 111 mmol/L   CO2 21 (L) 22 - 32 mmol/L   Glucose, Bld 127 (H) 65 - 99 mg/dL    BUN 13 6 - 20 mg/dL   Creatinine, Ser 0.84 0.44 - 1.00 mg/dL   Calcium 9.6 8.9 - 10.3 mg/dL   Total Protein 8.8 (H) 6.5 - 8.1 g/dL   Albumin 4.3 3.5 - 5.0 g/dL   AST 60 (H) 15 - 41 U/L   ALT 34 14 - 54 U/L   Alkaline Phosphatase 72 38 - 126 U/L   Total Bilirubin 0.6 0.3 - 1.2 mg/dL   GFR calc non Af Amer >60 >60 mL/min   GFR calc Af Amer >60 >60 mL/min    Comment: (NOTE) The eGFR has been calculated using the CKD EPI equation. This calculation has not been validated in all clinical situations. eGFR's persistently <60 mL/min signify possible Chronic Kidney Disease.    Anion gap 14 5 - 15  CBC     Status: Abnormal   Collection Time: 06/12/17 12:24 AM  Result Value Ref Range   WBC 11.5 (H) 3.6 - 11.0 K/uL   RBC 5.23 (H) 3.80 - 5.20 MIL/uL   Hemoglobin 14.8 12.0 - 16.0 g/dL   HCT 43.5 35.0 - 47.0 %   MCV 83.2 80.0 - 100.0 fL   MCH 28.2 26.0 - 34.0 pg   MCHC 33.9 32.0 - 36.0 g/dL   RDW 13.3 11.5 - 14.5 %   Platelets 313 150 - 440 K/uL  hCG, quantitative, pregnancy     Status: None   Collection Time: 06/12/17 12:24 AM  Result Value Ref Range   hCG, Beta Chain, Quant, S <1 <5 mIU/mL    Comment:          GEST. AGE      CONC.  (mIU/mL)   <=1 WEEK        5 - 50     2 WEEKS       50 - 500     3 WEEKS       100 - 10,000     4 WEEKS     1,000 - 30,000     5 WEEKS     3,500 - 115,000   6-8 WEEKS     12,000 - 270,000    12 WEEKS     15,000 - 220,000        FEMALE AND NON-PREGNANT FEMALE:     LESS THAN 5 mIU/mL   Lactic acid, plasma     Status: Abnormal   Collection Time: 06/12/17  2:42 AM  Result Value Ref Range   Lactic Acid, Venous 2.9 (HH) 0.5 - 1.9 mmol/L    Comment: CRITICAL RESULT CALLED TO, READ BACK BY AND VERIFIED WITH LAURIE ALLEN AT 9741 06/12/17.PMH  Urinalysis, Complete w Microscopic     Status: Abnormal   Collection Time: 06/12/17  3:44 AM  Result Value Ref Range   Color, Urine YELLOW (A) YELLOW   APPearance CLEAR (A) CLEAR   Specific Gravity, Urine 1.019 1.005 -  1.030   pH 5.0 5.0 - 8.0   Glucose, UA NEGATIVE NEGATIVE mg/dL   Hgb urine dipstick NEGATIVE NEGATIVE   Bilirubin Urine NEGATIVE NEGATIVE   Ketones, ur 20 (A) NEGATIVE mg/dL   Protein, ur NEGATIVE NEGATIVE mg/dL   Nitrite NEGATIVE NEGATIVE   Leukocytes, UA NEGATIVE NEGATIVE   RBC / HPF 0-5 0 - 5 RBC/hpf   WBC, UA 0-5 0 - 5 WBC/hpf   Bacteria, UA RARE (A) NONE SEEN   Squamous Epithelial / LPF 0-5 (A) NONE SEEN   Mucous PRESENT   Lactic acid, plasma     Status: Abnormal   Collection Time: 06/12/17  6:01 AM  Result Value Ref Range   Lactic Acid, Venous 2.7 (HH) 0.5 - 1.9 mmol/L    Comment: CRITICAL RESULT CALLED TO, READ BACK BY AND VERIFIED WITH LAURIE ALLEN AT 3794 06/12/17.PMH   No results found.  Review of Systems  Constitutional: Negative for chills and fever.  HENT: Negative for sore throat and tinnitus.   Eyes: Negative for blurred vision and redness.  Respiratory: Negative for cough and shortness of breath.   Cardiovascular: Negative for chest pain, palpitations, orthopnea and PND.  Gastrointestinal: Positive for abdominal pain, nausea and vomiting. Negative for diarrhea.  Genitourinary: Negative for dysuria, frequency and urgency.  Musculoskeletal: Negative for joint pain and myalgias.  Skin: Negative for rash.       No lesions  Neurological: Negative for speech change, focal weakness and weakness.  Endo/Heme/Allergies: Does not bruise/bleed easily.       No temperature intolerance  Psychiatric/Behavioral: Negative for depression and suicidal ideas.    Blood pressure 138/74, pulse (!) 124, temperature 99.5 F (37.5 C), temperature source Oral, resp. rate 16, height 5' 8"  (1.727 m), weight 116.4 kg (256 lb 11.2 oz), last menstrual period 05/31/2017, SpO2 100 %, unknown if currently breastfeeding. Physical Exam  Vitals reviewed. Constitutional: She is oriented to person, place, and time. She appears well-developed and well-nourished. No distress.  HENT:  Head:  Normocephalic and atraumatic.  Mouth/Throat: Oropharynx is clear and moist.  Eyes: Pupils are equal, round, and reactive to light. Conjunctivae and EOM are normal. No scleral icterus.  Neck: Normal range of motion. Neck supple. No JVD present. No tracheal deviation present. No thyromegaly present.  Cardiovascular: Normal rate, regular rhythm and normal heart sounds.  Exam reveals no gallop and no friction rub.   No murmur heard. Respiratory: Effort normal and breath sounds normal.  GI: Soft. Bowel sounds are normal. She exhibits no distension. There is no tenderness.  Genitourinary:  Genitourinary Comments: Deferred  Musculoskeletal: Normal range of motion. She exhibits no edema.  Lymphadenopathy:    She has no cervical adenopathy.  Neurological: She is alert and oriented to person, place, and time. No cranial nerve deficit. She exhibits normal muscle tone.  Skin: Skin is warm and dry. No rash noted. No erythema.  Psychiatric: She has a normal mood and affect. Her behavior is normal. Judgment and thought content normal.     Assessment/Plan This is a 31 year old female admitted for intractable nausea and vomiting. 1. Nausea and vomiting: Intractable; Zofran as needed. Compazine for refractory nausea and vomiting. Likely viral etiology. 2. Gastroenteritis: Obtain enterovirus panel. Symptoms consistent with norovirus. 3. Dehydration: Lactic acid increased; aggressive IV hydration. 4. Anxiety/depression: Continue Prozac 5. PCOS: Patient had been on  metformin. May have glucose intolerance as well. Check hemoglobin A1c. Sliding-scale insulin as needed. Continue birth control 6. DVT prophylaxis: Lovenox 7. GI prophylaxis: None The patient is a full code. Time spent on admission orders and patient care possibly 45 minutes  Harrie Foreman, MD 06/12/2017, 6:54 AM

## 2017-06-12 NOTE — Progress Notes (Signed)
[employer] [facility] [place] [facility]   PATIENT NAME: Melissa Martinez    MR#:  [id number]  DATE OF BIRTH:  04-03-1986  SUBJECTIVE:  CHIEF COMPLAINT:   Chief Complaint  Patient presents with  . Emesis  . Abdominal Pain     Her daughter had diarrhea and vomiting 3 days ago, after that patient and her husband started having similar symptoms yesterday with abdominal pain and some low-grade fever.  REVIEW OF SYSTEMS:  CONSTITUTIONAL: No fever, fatigue or weakness.  EYES: No blurred or double vision.  EARS, NOSE, AND THROAT: No tinnitus or ear pain.  RESPIRATORY: No cough, shortness of breath, wheezing or hemoptysis.  CARDIOVASCULAR: No chest pain, orthopnea, edema.  GASTROINTESTINAL: Positive for nausea, vomiting, diarrhea or abdominal pain.  GENITOURINARY: No dysuria, hematuria.  ENDOCRINE: No polyuria, nocturia,  HEMATOLOGY: No anemia, easy bruising or bleeding SKIN: No rash or lesion. MUSCULOSKELETAL: No joint pain or arthritis.   NEUROLOGIC: No tingling, numbness, weakness.  PSYCHIATRY: No anxiety or depression.   ROS  DRUG ALLERGIES:   Allergies  Allergen Reactions  . Levofloxacin Itching and Other (See Comments)    Throat tightening, generalized itching, redness  . Reglan [Metoclopramide] Anxiety    Causes paranoia   . Bactrim [Sulfamethoxazole-Trimethoprim] Itching    Also redness  . Equal [Aspartame] Itching  . Levaquin [Levofloxacin In D5w] Other (See Comments)    Throat tightening, generalized itching, redness  . Sulfa Antibiotics     VITALS:  Blood pressure 124/76, pulse 99, temperature 98.9 F (37.2 C), temperature source Oral, resp. rate 16, height 5\' 8"  (1.727 m), weight 116.4 kg (256 lb 11.2 oz), last menstrual period 05/31/2017, SpO2 100 %, unknown if currently breastfeeding.  PHYSICAL EXAMINATION:  GENERAL:  31 y.o.-year-old patient lying in the bed with no acute distress.  EYES: Pupils equal, round, reactive to light and accommodation. No  scleral icterus. Extraocular muscles intact.  HEENT: Head atraumatic, normocephalic. Oropharynx and nasopharynx clear.  NECK:  Supple, no jugular venous distention. No thyroid enlargement, no tenderness.  LUNGS: Normal breath sounds bilaterally, no wheezing, rales,rhonchi or crepitation. No use of accessory muscles of respiration.  CARDIOVASCULAR: S1, S2 normal. No murmurs, rubs, or gallops.  ABDOMEN: Soft, nontender, nondistended. Bowel sounds present. No organomegaly or mass.  EXTREMITIES: No pedal edema, cyanosis, or clubbing.  NEUROLOGIC: Cranial nerves II through XII are intact. Muscle strength 5/5 in all extremities. Sensation intact. Gait not checked.  PSYCHIATRIC: The patient is alert and oriented x 3.  SKIN: No obvious rash, lesion, or ulcer.   Physical Exam LABORATORY PANEL:   CBC  Recent Labs Lab 06/12/17 0024  WBC 11.5*  HGB 14.8  HCT 43.5  PLT 313   ------------------------------------------------------------------------------------------------------------------  Chemistries   Recent Labs Lab 06/12/17 0024  NA 141  K 4.3  CL 106  CO2 21*  GLUCOSE 127*  BUN 13  CREATININE 0.84  CALCIUM 9.6  AST 60*  ALT 34  ALKPHOS 72  BILITOT 0.6   ------------------------------------------------------------------------------------------------------------------  Cardiac Enzymes No results for input(s): TROPONINI in the last 168 hours. ------------------------------------------------------------------------------------------------------------------  RADIOLOGY:  No results found.  ASSESSMENT AND PLAN:   Active Problems:   Dehydration  This is a 31 year old female admitted for intractable nausea and vomiting. 1. Nausea and vomiting: Intractable; Zofran as needed. Compazine for refractory nausea and vomiting. Likely viral etiology. 2. Gastroenteritis: Obtain GI panel 3. Dehydration: Lactic acid increased; aggressive IV hydration.   Recheck tomorrow. 4.  Anxiety/depression: Continue Prozac 5. PCOS: Patient had  been on metformin. May have glucose intolerance as well. Check hemoglobin A1c. Sliding-scale insulin as needed. Continue birth control 6. DVT prophylaxis: Lovenox 7. GI prophylaxis: None   All the records are reviewed and case discussed with Care Management/Social Workerr. Management plans discussed with the patient, family and they are in agreement.  CODE STATUS: Full.  TOTAL TIME TAKING CARE OF THIS PATIENT: 35 minutes.    POSSIBLE D/C IN 1-2 DAYS, DEPENDING ON CLINICAL CONDITION.   Altamese DillingVACHHANI, Kerington Hildebrant M.D on 06/12/2017   Between 7am to 6pm - Pager - [phone]  After 6pm go to [email or web address] - password [email or web address]  [facility] [place] [facility]  Office  [phone]  CC: Primary care physician; Nadara MustardHarris, Robert P, MD  Note: This dictation was prepared with Dragon dictation along with smaller phrase technology. Any transcriptional errors that result from this process are unintentional.

## 2017-06-12 NOTE — ED Notes (Signed)
Pt up to toilet in room to void; specimen collected; back to bed; pt says her headache is gone but she is having pain "in my kidneys";

## 2017-06-12 NOTE — ED Notes (Signed)
MD still in with critical pt; inquired about nausea medication for pt; first order for Reglan was given but pt says she's allergic; medication added to allergy list; verbal order given for Phenergan, pt says she can take Phenergan

## 2017-06-12 NOTE — ED Notes (Signed)
Report to laurie, rn.  

## 2017-06-12 NOTE — ED Provider Notes (Signed)
[facility] Emergency Department Provider Note   ____________________________________________   First MD Initiated Contact with Patient 06/12/17 0021     (approximate)  I have reviewed the triage vital signs and the nursing notes.   HISTORY  Chief Complaint Emesis and Abdominal Pain    HPI Melissa Martinez is a 31 y.o. female who comes into the hospital today with abdominal pain vomiting and diarrhea. The patient was diagnosed with mesenteric adenitis on Friday. She went to her primary care physician's office and had some labs done as well as some a CT scan. She was told that should her pain get worse or should she have any worsening temperature she should come into the hospital. The patient reports that her temperature at home was 101.7. She states that she did have some vomiting of bright red blood that started today. She also had some frequent episodes of emesis starting from about 7:15 on. The patient also had some diarrhea at the time. Her stomach seemed to feel hard and she had some mid abdominal pain. The patient's daughter vomited Friday and her husband also started having vomiting and diarrhea. The patient states she has been unable to keep anything down since 7 PM. She decided to come into the hospital today for evaluation.   Past Medical History:  Diagnosis Date  . Abdominal pain, generalized 11/11/2014  . Altered bowel function 11/11/2014  . Anxiety    tkes Prozac, for OCD related anxiety  . Bleeding per rectum 11/11/2014  . Calculus of kidney 12/14/2015  . Complication of anesthesia   . Fatty infiltration of liver 11/11/2014  . PCOS (polycystic ovarian syndrome)   . Upper airway cough syndrome 08/17/2015   Followed in Pulmonary clinic/ [facility]/ [place]      Patient Active Problem List   Diagnosis Date Noted  . Dehydration 06/12/2017  . Closed Colles' fracture of left radius 10/17/2016  . Postpartum care following cesarean delivery  06/25/2016  . Preeclampsia 06/22/2016  . Labor and delivery, indication for care 06/20/2016  . Kidney stones 12/14/2015  . PCOS (polycystic ovarian syndrome) 12/14/2015  . Upper airway cough syndrome 08/17/2015  . Abdominal pain, generalized 11/11/2014  . Bleeding per rectum 11/11/2014  . Change in bowel habits 11/11/2014  . Fatty infiltration of liver 11/11/2014  . Bright red rectal bleeding 11/11/2014    Past Surgical History:  Procedure Laterality Date  . CESAREAN SECTION N/A 06/22/2016   Procedure: CESAREAN SECTION;  Surgeon: Nadara Mustardobert P Harris, MD;  Location: [facility] ORS;  Service: Obstetrics;  Laterality: N/A;  . CHOLECYSTECTOMY    . COLONOSCOPY  09/2014  . LAPAROSCOPIC CHOLECYSTECTOMY  06/2009  . STENT PLACE LEFT URETER ([facility] HX) Left 11/2012   has had 2 stones pass stones    Prior to Admission medications   Medication Sig Start Date End Date Taking? Authorizing Provider  cetirizine (ZYRTEC) 10 MG tablet Take 10 mg by mouth daily.   Yes [provider]  Cholecalciferol (VITAMIN D3) 2000 UNITS capsule Take 4,000 Units by mouth daily.    Yes [provider]  FLUoxetine (PROZAC) 10 MG tablet Take 20 mg by mouth at bedtime.  05/22/17  Yes [provider]  GIANVI 3-0.02 MG tablet Take 1 tablet by mouth daily.  04/13/17  Yes [provider]  metFORMIN (GLUCOPHAGE-XR) 500 MG 24 hr tablet Take 500 mg by mouth daily.   Yes [provider]  ondansetron (ZOFRAN) 4 MG tablet Take 1 tablet (4 mg total)  by mouth every 8 (eight) hours as needed for nausea (may cause drowsiness). 06/09/17  Yes Flinchum, Eula Fried, FNP  Prenatal Vit-Fe Fumarate-FA (PRENATAL VITAMIN PO) Take 1 tablet by mouth daily.   Yes [provider]    Allergies Levofloxacin; Reglan [metoclopramide]; Bactrim [sulfamethoxazole-trimethoprim]; Equal [aspartame]; Levaquin [levofloxacin in d5w]; and Sulfa antibiotics  Family History  Problem Relation Age of Onset  . Heart  disease Father   . Hyperlipidemia Father   . Breast cancer Maternal Grandmother   . Diabetes Maternal Grandmother   . Bladder Cancer Maternal Grandfather   . Heart disease Maternal Grandfather   . Kidney Stones Mother   . Hypertension Mother   . Hypertension Brother     Social History Social History  Substance Use Topics  . Smoking status: Never Smoker  . Smokeless tobacco: Never Used  . Alcohol use 0.0 oz/week     Comment: rarely    Review of Systems  Constitutional: No fever/chills Eyes: No visual changes. ENT: No sore throat. Cardiovascular: Denies chest pain. Respiratory: Denies shortness of breath. Gastrointestinal: abdominal pain, nausea, vomiting, diarrhea.  No constipation. Genitourinary: Negative for dysuria. Musculoskeletal: Negative for back pain. Skin: Negative for rash. Neurological: Negative for headaches, focal weakness or numbness.  ____________________________________________   PHYSICAL EXAM:  VITAL SIGNS: ED Triage Vitals  Enc Vitals Group     BP 06/11/17 2357 (!) 146/89     Pulse Rate 06/11/17 2357 (!) 136     Resp 06/11/17 2357 18     Temp 06/11/17 2357 99.4 F (37.4 C)     Temp Source 06/11/17 2357 Oral     SpO2 06/11/17 2357 98 %     Weight 06/11/17 2358 264 lb (119.7 kg)     Height 06/11/17 2358 5\' 8"  (1.727 m)     Head Circumference --      Peak Flow --      Pain Score 06/11/17 2357 6     Pain Loc --      Pain Edu? --      Excl. in GC? --     Constitutional: Alert and oriented. Well appearing and in moderate distress. Eyes: Conjunctivae are normal. PERRL. EOMI. Head: Atraumatic. Nose: No congestion/rhinnorhea. Mouth/Throat: Mucous membranes are moist.  Oropharynx non-erythematous. Cardiovascular: Normal rate, regular rhythm. Grossly normal heart sounds.  Good peripheral circulation. Respiratory: Normal respiratory effort.  No retractions. Lungs CTAB. Gastrointestinal: Soft with some diffuse abd pain to palpation. No distention.  Positive bowel sounds Musculoskeletal: No lower extremity tenderness nor edema.   Neurologic:  Normal speech and language.  Skin:  Skin is warm, dry and intact.  Psychiatric: Mood and affect are normal. Speech and behavior are normal.  ____________________________________________   LABS (all labs ordered are listed, but only abnormal results are displayed)  Labs Reviewed  COMPREHENSIVE METABOLIC PANEL - Abnormal; Notable for the following:       Result Value   CO2 21 (*)    Glucose, Bld 127 (*)    Total Protein 8.8 (*)    AST 60 (*)    All other components within normal limits  CBC - Abnormal; Notable for the following:    WBC 11.5 (*)    RBC 5.23 (*)    All other components within normal limits  URINALYSIS, COMPLETE (UACMP) WITH MICROSCOPIC - Abnormal; Notable for the following:    Color, Urine YELLOW (*)    APPearance CLEAR (*)    Ketones, ur 20 (*)    Bacteria, UA RARE (*)  Squamous Epithelial / LPF 0-5 (*)    All other components within normal limits  LACTIC ACID, PLASMA - Abnormal; Notable for the following:    Lactic Acid, Venous 2.9 (*)    All other components within normal limits  LACTIC ACID, PLASMA - Abnormal; Notable for the following:    Lactic Acid, Venous 2.7 (*)    All other components within normal limits  LIPASE, BLOOD  HCG, QUANTITATIVE, PREGNANCY  TSH   ____________________________________________  EKG  ED ECG REPORT I, Rebecka ApleyWebster,  Allison P, the attending physician, personally viewed and interpreted this ECG.   Date: 06/12/2017  EKG Time: 015  Rate: 135  Rhythm: sinus tachycardia  Axis: normal  Intervals:none  ST&T Change: none  ____________________________________________  RADIOLOGY  No results found.  ____________________________________________   PROCEDURES  Procedure(s) performed: None  Procedures  Critical Care performed: No  ____________________________________________   INITIAL IMPRESSION / ASSESSMENT AND PLAN / ED  COURSE  Pertinent labs & imaging results that were available during my care of the patient were reviewed by me and considered in my medical decision making (see chart for details).  This is a 31 year old female who comes into the hospital today with some abdominal pain. The patient is also been having some vomiting and diarrhea. The patient is tachycardic to the 130s and she reports that she's been vomiting and is unable to keep down fluids. I did give the patient 2 L of normal saline. The patient's heart rate improved to the 120s but I then decided to order a lactic acid. The patient's lactic acid came back at 2.9. Although the patient did have a CT scan that showed mesenteric adenitis I feel that she has some severe dehydration from her vomiting and diarrhea. The patient has ketones in her urine. I ordered a third liter of normal saline and the decision was made to admit the patient to the hospitalist service. I discussed this with the patient and she is agrees with the plan. The patient received 2 doses of Zofran as well as a dose of Phenergan. She also received some Toradol and some Fioricet for headache.      ____________________________________________   FINAL CLINICAL IMPRESSION(S) / ED DIAGNOSES  Final diagnoses:  Nausea vomiting and diarrhea  Dehydration  Lactic acidosis  Tachycardia      NEW MEDICATIONS STARTED DURING THIS VISIT:  Current Discharge Medication List       Note:  This document was prepared using Dragon voice recognition software and may include unintentional dictation errors.    Rebecka ApleyWebster, Allison P, MD 06/12/17 [phone]

## 2017-06-12 NOTE — ED Notes (Signed)
Dr Diamond at bedside for admission 

## 2017-06-12 NOTE — ED Notes (Signed)
Gunnar Fusiaula called from lab to say pt's lactic acid is 2.9; reported to Dr Zenda AlpersWebster;

## 2017-06-12 NOTE — ED Triage Notes (Signed)
Patient with complaint of vomiting and upper abdominal tenderness that started today. Patient states that the last episode of vomiting had some bright red blood. Patient states that she took zofran at 19:15 with no improvement. Patient states that she had a ct scan on Friday for similar symptoms. Patient states that her ct scan showed mesenteric adenitis.

## 2017-06-12 NOTE — Progress Notes (Signed)
Notified Dr Elisabeth PigeonVachhani that patient tested positive for norovirus, G1/G2

## 2017-06-12 NOTE — ED Notes (Signed)
Dr Sheryle Hailiamond returned page; notified of pt's lactic acid 2.7

## 2017-06-12 NOTE — ED Notes (Signed)
In to check on pt; says pain is much better but still having pain around kidney areas; also c/o nausea; will inform MD when she is available

## 2017-06-13 DIAGNOSIS — E860 Dehydration: Secondary | ICD-10-CM | POA: Diagnosis not present

## 2017-06-13 DIAGNOSIS — F419 Anxiety disorder, unspecified: Secondary | ICD-10-CM | POA: Diagnosis not present

## 2017-06-13 DIAGNOSIS — K529 Noninfective gastroenteritis and colitis, unspecified: Secondary | ICD-10-CM | POA: Diagnosis not present

## 2017-06-13 DIAGNOSIS — R112 Nausea with vomiting, unspecified: Secondary | ICD-10-CM | POA: Diagnosis not present

## 2017-06-13 LAB — CULTURE, URINE COMPREHENSIVE

## 2017-06-13 LAB — BASIC METABOLIC PANEL
ANION GAP: 6 (ref 5–15)
BUN: 5 mg/dL — ABNORMAL LOW (ref 6–20)
CALCIUM: 7.1 mg/dL — AB (ref 8.9–10.3)
CHLORIDE: 110 mmol/L (ref 101–111)
CO2: 22 mmol/L (ref 22–32)
CREATININE: 0.68 mg/dL (ref 0.44–1.00)
Glucose, Bld: 119 mg/dL — ABNORMAL HIGH (ref 65–99)
POTASSIUM: 2.9 mmol/L — AB (ref 3.5–5.1)
SODIUM: 138 mmol/L (ref 135–145)

## 2017-06-13 LAB — CBC
HCT: 33 % — ABNORMAL LOW (ref 35.0–47.0)
Hemoglobin: 11.4 g/dL — ABNORMAL LOW (ref 12.0–16.0)
MCH: 28.6 pg (ref 26.0–34.0)
MCHC: 34.7 g/dL (ref 32.0–36.0)
MCV: 82.3 fL (ref 80.0–100.0)
PLATELETS: 225 10*3/uL (ref 150–440)
RBC: 4.01 MIL/uL (ref 3.80–5.20)
RDW: 13.6 % (ref 11.5–14.5)
WBC: 6.1 10*3/uL (ref 3.6–11.0)

## 2017-06-13 LAB — LACTIC ACID, PLASMA: Lactic Acid, Venous: 1.8 mmol/L (ref 0.5–1.9)

## 2017-06-13 LAB — GLUCOSE, CAPILLARY: Glucose-Capillary: 102 mg/dL — ABNORMAL HIGH (ref 65–99)

## 2017-06-13 LAB — MAGNESIUM: Magnesium: 1.4 mg/dL — ABNORMAL LOW (ref 1.7–2.4)

## 2017-06-13 LAB — POTASSIUM: Potassium: 3.5 mmol/L (ref 3.5–5.1)

## 2017-06-13 MED ORDER — POTASSIUM CHLORIDE CRYS ER 20 MEQ PO TBCR
20.0000 meq | EXTENDED_RELEASE_TABLET | Freq: Two times a day (BID) | ORAL | Status: DC
  Administered 2017-06-13: 20 meq via ORAL
  Filled 2017-06-13: qty 1

## 2017-06-13 MED ORDER — POTASSIUM CHLORIDE IN NACL 40-0.9 MEQ/L-% IV SOLN
INTRAVENOUS | Status: AC
  Administered 2017-06-13: 75 mL/h via INTRAVENOUS
  Filled 2017-06-13: qty 1000

## 2017-06-13 MED ORDER — MAGNESIUM SULFATE 2 GM/50ML IV SOLN
2.0000 g | Freq: Once | INTRAVENOUS | Status: AC
  Administered 2017-06-13: 12:00:00 2 g via INTRAVENOUS
  Filled 2017-06-13: qty 50

## 2017-06-13 NOTE — Progress Notes (Signed)
Pt being discharged home, discharge instructions reviewed with pt, states understanding, work note provided, home med yaz returned, pt with no complaints at discharge, refuses wheelchair

## 2017-06-13 NOTE — Progress Notes (Signed)
Dr Elisabeth PigeonVachhani made aware that pt K+2.9

## 2017-06-13 NOTE — Progress Notes (Signed)
[facility]         [place], [place]   06/13/2017  Patient: Melissa Martinez   Date of Birth:  10-28-1986  Date of admission:  06/12/2017  Date of Discharge  06/13/2017    To Whom it May Concern:   Melissa ItoErica Martinez  may return to work on 06/16/17.  PHYSICAL ACTIVITY:  Full  If you have any questions or concerns, please don't hesitate to call.  Sincerely,   Altamese DillingVACHHANI, Zemira Zehring M.D Pager [phone]- [phone] Office : [phone]   .

## 2017-06-13 NOTE — Discharge Summary (Signed)
[facility] - [place] at [facility]   PATIENT NAME: Melissa Martinez    MR#:  [id number]  DATE OF BIRTH:  11-01-1986  DATE OF ADMISSION:  06/12/2017 ADMITTING PHYSICIAN: Arnaldo Natal, MD  DATE OF DISCHARGE: 06/13/2017  PRIMARY CARE PHYSICIAN: [name], MD    ADMISSION DIAGNOSIS:  Dehydration [E86.0] Lactic acidosis [E87.2] Tachycardia [R00.0] Nausea vomiting and diarrhea [R11.2, R19.7]  DISCHARGE DIAGNOSIS:  Active Problems:   Dehydration   Norvo virus infection.  SECONDARY DIAGNOSIS:   Past Medical History:  Diagnosis Date  . Abdominal pain, generalized 11/11/2014  . Altered bowel function 11/11/2014  . Anxiety    tkes Prozac, for OCD related anxiety  . Bleeding per rectum 11/11/2014  . Calculus of kidney 12/14/2015  . Complication of anesthesia   . Fatty infiltration of liver 11/11/2014  . PCOS (polycystic ovarian syndrome)   . Upper airway cough syndrome 08/17/2015   Followed in Pulmonary clinic/ [facility]/ [facility]      HOSPITAL COURSE:   This is a 31 year old female admitted for intractable nausea and vomiting. 1. Nausea and vomiting: Intractable; Zofran as needed. Compazine for refractory nausea and vomiting. norvo virus infection. 2. Gastroenteritis: Obtained GI panel- norvo virus. 3. Dehydration: Lactic acid increased; aggressive IV hydration.   improved. 4. Anxiety/depression: Continue Prozac 5. PCOS: Patient had been on metformin. Continue birth control 6. DVT prophylaxis: Lovenox 7. GI prophylaxis: None  DISCHARGE CONDITIONS:   Stable.  CONSULTS OBTAINED:    DRUG ALLERGIES:   Allergies  Allergen Reactions  . Levofloxacin Itching and Other (See Comments)    Throat tightening, generalized itching, redness  . Reglan [Metoclopramide] Anxiety    Causes paranoia   . Bactrim [Sulfamethoxazole-Trimethoprim] Itching    Also redness  . Equal [Aspartame] Itching  . Levaquin [Levofloxacin In D5w] Other (See  Comments)    Throat tightening, generalized itching, redness  . Sulfa Antibiotics     DISCHARGE MEDICATIONS:   Current Discharge Medication List    CONTINUE these medications which have NOT CHANGED   Details  cetirizine (ZYRTEC) 10 MG tablet Take 10 mg by mouth daily.    Cholecalciferol (VITAMIN D3) 2000 UNITS capsule Take 4,000 Units by mouth daily.     FLUoxetine (PROZAC) 10 MG tablet Take 20 mg by mouth at bedtime.  Refills: 2    GIANVI 3-0.02 MG tablet Take 1 tablet by mouth daily.  Refills: 2    metFORMIN (GLUCOPHAGE-XR) 500 MG 24 hr tablet Take 500 mg by mouth daily.    ondansetron (ZOFRAN) 4 MG tablet Take 1 tablet (4 mg total) by mouth every 8 (eight) hours as needed for nausea (may cause drowsiness). Qty: 20 tablet, Refills: 0    Prenatal Vit-Fe Fumarate-FA (PRENATAL VITAMIN PO) Take 1 tablet by mouth daily.      STOP taking these medications     ibuprofen (ADVIL,MOTRIN) 600 MG tablet          DISCHARGE INSTRUCTIONS:    Follow with PMD in 1-2  Weeks.  If you experience worsening of your admission symptoms, develop shortness of breath, life threatening emergency, suicidal or homicidal thoughts you must seek medical attention immediately by calling 911 or calling your MD immediately  if symptoms less severe.  You Must read complete instructions/literature along with all the possible adverse reactions/side effects for all the Medicines you take and that have been prescribed to you. Take any new Medicines after you have completely understood and accept all the possible adverse reactions/side  effects.   Please note  You were cared for by a hospitalist during your hospital stay. If you have any questions about your discharge medications or the care you received while you were in the hospital after you are discharged, you can call the unit and asked to speak with the hospitalist on call if the hospitalist that took care of you is not available. Once you are  discharged, your primary care physician will handle any further medical issues. Please note that NO REFILLS for any discharge medications will be authorized once you are discharged, as it is imperative that you return to your primary care physician (or establish a relationship with a primary care physician if you do not have one) for your aftercare needs so that they can reassess your need for medications and monitor your lab values.    Today   CHIEF COMPLAINT:   Chief Complaint  Patient presents with  . Emesis  . Abdominal Pain    HISTORY OF PRESENT ILLNESS:  Melissa Martinez  is a 31 y.o. female with a known history of PCOS, functional abdominal pain and anxiety presents to the emergency department complaining of nausea and vomiting. The patient states her symptoms have been present for possibly 4 days. She visited her primary care doctor 3 days ago and obtained lab work that was reassuring at the time. However she has continued to have nonbloody nonbilious emesis and some loose stool. Her husband is sick and her young daughter has had 1 day of similar symptoms. She is not traveled out of the country or been on a cruise. She denies fever but admits to abdominal pain. The patient received 3 L of fluid in the emergency department but continued to have tachycardia as well as uncontrolled nausea and vomiting which prompted the emergency department staff to call the hospitalist service for admission.   VITAL SIGNS:  Blood pressure 105/76, pulse (!) 102, temperature 98.4 F (36.9 C), temperature source Oral, resp. rate 16, height 5\' 8"  (1.727 m), weight 122.9 kg (271 lb 0.1 oz), last menstrual period 05/31/2017, SpO2 100 %, unknown if currently breastfeeding.  I/O:   Intake/Output Summary (Last 24 hours) at 06/13/17 1500 Last data filed at 06/13/17 1000  Gross per 24 hour  Intake             3145 ml  Output             2100 ml  Net             1045 ml    PHYSICAL EXAMINATION:   GENERAL:  31  y.o.-year-old patient lying in the bed with no acute distress.  EYES: Pupils equal, round, reactive to light and accommodation. No scleral icterus. Extraocular muscles intact.  HEENT: Head atraumatic, normocephalic. Oropharynx and nasopharynx clear.  NECK:  Supple, no jugular venous distention. No thyroid enlargement, no tenderness.  LUNGS: Normal breath sounds bilaterally, no wheezing, rales,rhonchi or crepitation. No use of accessory muscles of respiration.  CARDIOVASCULAR: S1, S2 normal. No murmurs, rubs, or gallops.  ABDOMEN: Soft, nontender, nondistended. Bowel sounds present. No organomegaly or mass.  EXTREMITIES: No pedal edema, cyanosis, or clubbing.  NEUROLOGIC: Cranial nerves II through XII are intact. Muscle strength 5/5 in all extremities. Sensation intact. Gait not checked.  PSYCHIATRIC: The patient is alert and oriented x 3.  SKIN: No obvious rash, lesion, or ulcer.   DATA REVIEW:   CBC  Recent Labs Lab 06/13/17 0453  WBC 6.1  HGB 11.4*  HCT  33.0*  PLT 225    Chemistries   Recent Labs Lab 06/12/17 0024 06/13/17 0453 06/13/17 1404  NA 141 138  --   K 4.3 2.9* 3.5  CL 106 110  --   CO2 21* 22  --   GLUCOSE 127* 119*  --   BUN 13 5*  --   CREATININE 0.84 0.68  --   CALCIUM 9.6 7.1*  --   MG  --  1.4*  --   AST 60*  --   --   ALT 34  --   --   ALKPHOS 72  --   --   BILITOT 0.6  --   --     Cardiac Enzymes No results for input(s): TROPONINI in the last 168 hours.  Microbiology Results  Results for orders placed or performed during the hospital encounter of 06/12/17  Gastrointestinal Panel by PCR , Stool     Status: Abnormal   Collection Time: 06/12/17  3:30 PM  Result Value Ref Range Status   Campylobacter species NOT DETECTED NOT DETECTED Final   Plesimonas shigelloides NOT DETECTED NOT DETECTED Final   Salmonella species NOT DETECTED NOT DETECTED Final   Yersinia enterocolitica NOT DETECTED NOT DETECTED Final   Vibrio species NOT DETECTED NOT  DETECTED Final   Vibrio cholerae NOT DETECTED NOT DETECTED Final   Enteroaggregative E coli (EAEC) NOT DETECTED NOT DETECTED Final   Enteropathogenic E coli (EPEC) NOT DETECTED NOT DETECTED Final   Enterotoxigenic E coli (ETEC) NOT DETECTED NOT DETECTED Final   Shiga like toxin producing E coli (STEC) NOT DETECTED NOT DETECTED Final   Shigella/Enteroinvasive E coli (EIEC) NOT DETECTED NOT DETECTED Final   Cryptosporidium NOT DETECTED NOT DETECTED Final   Cyclospora cayetanensis NOT DETECTED NOT DETECTED Final   Entamoeba histolytica NOT DETECTED NOT DETECTED Final   Giardia lamblia NOT DETECTED NOT DETECTED Final   Adenovirus F40/41 NOT DETECTED NOT DETECTED Final   Astrovirus NOT DETECTED NOT DETECTED Final   Norovirus GI/GII DETECTED (A) NOT DETECTED Final    Comment: RESULT CALLED TO, READ BACK BY AND VERIFIED WITH: DEIDRA MALCOLM AT 1750 06/12/2017 BY TFK.    Rotavirus A NOT DETECTED NOT DETECTED Final   Sapovirus (I, II, IV, and V) NOT DETECTED NOT DETECTED Final    RADIOLOGY:  No results found.  EKG:   Orders placed or performed during the hospital encounter of 06/12/17  . ED EKG  . ED EKG  . EKG 12-Lead  . EKG 12-Lead      Management plans discussed with the patient, family and they are in agreement.  CODE STATUS:     Code Status Orders        Start     Ordered   06/12/17 0709  Full code  Continuous     06/12/17 0708    Code Status History    Date Active Date Inactive Code Status Order ID Comments User Context   06/22/2016 11:27 PM 06/26/2016  6:57 PM Full Code [id number]  Nadara MustardHarris, Robert P, MD Inpatient   06/20/2016  2:05 AM 06/20/2016  7:42 PM Full Code [id number]  Conard NovakJackson, Stephen D, MD Inpatient    Advance Directive Documentation     Most Recent Value  Type of Advance Directive  Healthcare Power of Attorney, Living will  Pre-existing out of facility DNR order (yellow form or pink MOST form)  -  "MOST" Form in Place?  -      TOTAL TIME TAKING CARE OF  THIS  PATIENT: 35 minutes.    Altamese Dilling M.D on 06/13/2017 at 3:00 PM  Between 7am to 6pm - Pager - [phone]  After 6pm go to [email or web address] - password [email or web address]  [facility]  Office  [phone]  CC: Primary care physician; [name], MD   Note: This dictation was prepared with Dragon dictation along with smaller phrase technology. Any transcriptional errors that result from this process are unintentional.

## 2017-06-13 NOTE — Progress Notes (Signed)
Notified Dr. Sheryle Hailiamond via text page of potassium level of 2.9.

## 2017-06-14 ENCOUNTER — Encounter: Payer: Self-pay | Admitting: Adult Health

## 2017-06-15 ENCOUNTER — Encounter: Payer: Self-pay | Admitting: Adult Health

## 2017-06-23 ENCOUNTER — Ambulatory Visit: Payer: Self-pay | Admitting: Obstetrics & Gynecology

## 2017-06-23 ENCOUNTER — Ambulatory Visit: Payer: Self-pay | Admitting: Adult Health

## 2017-06-23 ENCOUNTER — Encounter: Payer: Self-pay | Admitting: Adult Health

## 2017-06-23 VITALS — BP 128/84 | HR 77 | Temp 99.2°F

## 2017-06-23 DIAGNOSIS — I889 Nonspecific lymphadenitis, unspecified: Secondary | ICD-10-CM

## 2017-06-23 DIAGNOSIS — Z7721 Contact with and (suspected) exposure to potentially hazardous body fluids: Secondary | ICD-10-CM

## 2017-06-23 DIAGNOSIS — R110 Nausea: Secondary | ICD-10-CM

## 2017-06-23 MED ORDER — ONDANSETRON 4 MG PO TBDP: 4.0000 mg | ORAL_TABLET | Freq: Three times a day (TID) | ORAL | 0 refills | Status: DC | PRN

## 2017-06-23 NOTE — Patient Instructions (Signed)
Blood Glucose Monitoring, Adult Monitoring your blood sugar (glucose) helps you manage your diabetes. It also helps you and your health care provider determine how well your diabetes management plan is working. Blood glucose monitoring involves checking your blood glucose as often as directed, and keeping a record (log) of your results over time. Why should I monitor my blood glucose? Checking your blood glucose regularly can:  Help you understand how food, exercise, illnesses, and medicines affect your blood glucose.  Let you know what your blood glucose is at any time. You can quickly tell if you are having low blood glucose (hypoglycemia) or high blood glucose (hyperglycemia).  Help you and your health care provider adjust your medicines as needed.  When should I check my blood glucose? Follow instructions from your health care provider about how often to check your blood glucose. This may depend on:  The type of diabetes you have.  How well-controlled your diabetes is.  Medicines you are taking.  If you have type 1 diabetes:  Check your blood glucose at least 2 times a day.  Also check your blood glucose: ? Before every insulin injection. ? Before and after exercise. ? Between meals. ? 2 hours after a meal. ? Occasionally between 2:00 a.m. and 3:00 a.m., as directed. ? Before potentially dangerous tasks, like driving or using heavy machinery. ? At bedtime.  You may need to check your blood glucose more often, up to 6-10 times a day: ? If you use an insulin pump. ? If you need multiple daily injections (MDI). ? If your diabetes is not well-controlled. ? If you are ill. ? If you have a history of severe hypoglycemia. ? If you have a history of not knowing when your blood glucose is getting low (hypoglycemia unawareness). If you have type 2 diabetes:  If you take insulin or other diabetes medicines, check your blood glucose at least 2 times a day.  If you are on intensive  insulin therapy, check your blood glucose at least 4 times a day. Occasionally, you may also need to check between 2:00 a.m. and 3:00 a.m., as directed.  Also check your blood glucose: ? Before and after exercise. ? Before potentially dangerous tasks, like driving or using heavy machinery.  You may need to check your blood glucose more often if: ? Your medicine is being adjusted. ? Your diabetes is not well-controlled. ? You are ill. What is a blood glucose log?  A blood glucose log is a record of your blood glucose readings. It helps you and your health care provider: ? Look for patterns in your blood glucose over time. ? Adjust your diabetes management plan as needed.  Every time you check your blood glucose, write down your result and notes about things that may be affecting your blood glucose, such as your diet and exercise for the day.  Most glucose meters store a record of glucose readings in the meter. Some meters allow you to download your records to a computer. How do I check my blood glucose? Follow these steps to get accurate readings of your blood glucose: Supplies needed   Blood glucose meter.  Test strips for your meter. Each meter has its own strips. You must use the strips that come with your meter.  A needle to prick your finger (lancet). Do not use lancets more than once.  A device that holds the lancet (lancing device).  A journal or log book to write down your results. Procedure  Wash your hands with soap and water.  Prick the side of your finger (not the tip) with the lancet. Use a different finger each time.  Gently rub the finger until a small drop of blood appears.  Follow instructions that come with your meter for inserting the test strip, applying blood to the strip, and using your blood glucose meter.  Write down your result and any notes. Alternative testing sites  Some meters allow you to use areas of your body other than your finger  (alternative sites) to test your blood.  If you think you may have hypoglycemia, or if you have hypoglycemia unawareness, do not use alternative sites. Use your finger instead.  Alternative sites may not be as accurate as the fingers, because blood flow is slower in these areas. This means that the result you get may be delayed, and it may be different from the result that you would get from your finger.  The most common alternative sites are: ? Forearm. ? Thigh. ? Palm of the hand. Additional tips  Always keep your supplies with you.  If you have questions or need help, all blood glucose meters have a 24-hour "hotline" number that you can call. You may also contact your health care provider.  After you use a few boxes of test strips, adjust (calibrate) your blood glucose meter by following instructions that came with your meter. This information is not intended to replace advice given to you by your health care provider. Make sure you discuss any questions you have with your health care provider. Document Released: 11/03/2003 Document Revised: 05/20/2016 Document Reviewed: 04/11/2016 Elsevier Interactive Patient Education  2017 [employer] Dizziness Dizziness is a common problem. It is a feeling of unsteadiness or light-headedness. You may feel like you are about to faint. Dizziness can lead to injury if you stumble or fall. Anyone can become dizzy, but dizziness is more common in older adults. This condition can be caused by a number of things, including medicines, dehydration, or illness. Follow these instructions at home: Taking these steps may help with your condition: Eating and drinking  Drink enough fluid to keep your urine clear or pale yellow. This helps to keep you from becoming dehydrated. Try to drink more clear fluids, such as water.  Do not drink alcohol.  Limit your caffeine intake if directed by your health care provider.  Limit your salt intake if directed by your  health care provider. Activity  Avoid making quick movements. ? Rise slowly from chairs and steady yourself until you feel okay. ? In the morning, first sit up on the side of the bed. When you feel okay, stand slowly while you hold onto something until you know that your balance is fine.  Move your legs often if you need to stand in one place for a long time. Tighten and relax your muscles in your legs while you are standing.  Do not drive or operate heavy machinery if you feel dizzy.  Avoid bending down if you feel dizzy. Place items in your home so that they are easy for you to reach without leaning over. Lifestyle  Do not use any tobacco products, including cigarettes, chewing tobacco, or electronic cigarettes. If you need help quitting, ask your health care provider.  Try to reduce your stress level, such as with yoga or meditation. Talk with your health care provider if you need help. General instructions  Watch your dizziness for any changes.  Take medicines only as directed  by your health care provider. Talk with your health care provider if you think that your dizziness is caused by a medicine that you are taking.  Tell a friend or a family member that you are feeling dizzy. If he or she notices any changes in your behavior, have this person call your health care provider.  Keep all follow-up visits as directed by your health care provider. This is important. Contact a health care provider if:  Your dizziness does not go away.  Your dizziness or light-headedness gets worse.  You feel nauseous.  You have reduced hearing.  You have new symptoms.  You are unsteady on your feet or you feel like the room is spinning. Get help right away if:  You vomit or have diarrhea and are unable to eat or drink anything.  You have problems talking, walking, swallowing, or using your arms, hands, or legs.  You feel generally weak.  You are not thinking clearly or you have trouble  forming sentences. It may take a friend or family member to notice this.  You have chest pain, abdominal pain, shortness of breath, or sweating.  Your vision changes.  You notice any bleeding.  You have a headache.  You have neck pain or a stiff neck.  You have a fever. This information is not intended to replace advice given to you by your health care provider. Make sure you discuss any questions you have with your health care provider. Document Released: 04/26/2001 Document Revised: 04/07/2016 Document Reviewed: 10/27/2014 Elsevier Interactive Patient Education  2017 [employer] Ondansetron oral dissolving tablet What is this medicine? ONDANSETRON (on DAN se tron) is used to treat nausea and vomiting caused by chemotherapy. It is also used to prevent or treat nausea and vomiting after surgery. This medicine may be used for other purposes; ask your health care provider or pharmacist if you have questions. COMMON BRAND NAME(S): Zofran ODT What should I tell my health care provider before I take this medicine? They need to know if you have any of these conditions: -heart disease -history of irregular heartbeat -liver disease -low levels of magnesium or potassium in the blood -an unusual or allergic reaction to ondansetron, granisetron, other medicines, foods, dyes, or preservatives -pregnant or trying to get pregnant -breast-feeding How should I use this medicine? These tablets are made to dissolve in the mouth. Do not try to push the tablet through the foil backing. With dry hands, peel away the foil backing and gently remove the tablet. Place the tablet in the mouth and allow it to dissolve, then swallow. While you may take these tablets with water, it is not necessary to do so. Talk to your pediatrician regarding the use of this medicine in children. Special care may be needed. Overdosage: If you think you have taken too much of this medicine contact a poison control center or  emergency room at once. NOTE: This medicine is only for you. Do not share this medicine with others. What if I miss a dose? If you miss a dose, take it as soon as you can. If it is almost time for your next dose, take only that dose. Do not take double or extra doses. What may interact with this medicine? Do not take this medicine with any of the following medications: -apomorphine -certain medicines for fungal infections like fluconazole, itraconazole, ketoconazole, posaconazole, voriconazole -cisapride -dofetilide -dronedarone -pimozide -thioridazine -ziprasidone This medicine may also interact with the following medications: -carbamazepine -certain medicines for depression, anxiety,  or psychotic disturbances -fentanyl -linezolid -MAOIs like Carbex, Eldepryl, Marplan, Nardil, and Parnate -methylene blue (injected into a vein) -other medicines that prolong the QT interval (cause an abnormal heart rhythm) -phenytoin -rifampicin -tramadol This list may not describe all possible interactions. Give your health care provider a list of all the medicines, herbs, non-prescription drugs, or dietary supplements you use. Also tell them if you smoke, drink alcohol, or use illegal drugs. Some items may interact with your medicine. What should I watch for while using this medicine? Check with your doctor or health care professional as soon as you can if you have any sign of an allergic reaction. What side effects may I notice from receiving this medicine? Side effects that you should report to your doctor or health care professional as soon as possible: -allergic reactions like skin rash, itching or hives, swelling of the face, lips, or tongue -breathing problems -confusion -dizziness -fast or irregular heartbeat -feeling faint or lightheaded, falls -fever and chills -loss of balance or coordination -seizures -sweating -swelling of the hands and feet -tightness in the  chest -tremors -unusually weak or tired Side effects that usually do not require medical attention (report to your doctor or health care professional if they continue or are bothersome): -constipation or diarrhea -headache This list may not describe all possible side effects. Call your doctor for medical advice about side effects. You may report side effects to FDA at 1-800-FDA-1088. Where should I keep my medicine? Keep out of the reach of children. Store between 2 and 30 degrees C (36 and 86 degrees F). Throw away any unused medicine after the expiration date. NOTE: This sheet is a summary. It may not cover all possible information. If you have questions about this medicine, talk to your doctor, pharmacist, or health care provider.  2018 Elsevier/[employer] Standard (2013-08-07 16:21:52) Nausea, Adult Feeling sick to your stomach (nausea) means that your stomach is upset or you feel like you have to throw up (vomit). Feeling sick to your stomach is usually not serious, but it may be an early sign of a more serious medical problem. As you feel sicker to your stomach, it can lead to throwing up (vomiting). If you throw up, or if you are not able to drink enough fluids, there is a risk of dehydration. Dehydration can make you feel tired and thirsty, have a dry mouth, and pee (urinate) less often. Older adults and people who have other diseases or a weak defense (immune) system have a higher risk of dehydration. The main goal of treating this condition is to:  Limit how often you feel sick to your stomach.  Prevent throwing up and dehydration.  Follow these instructions at home: Follow instructions from your doctor about how to care for yourself at home. Eating and drinking Follow these recommendations as told by your doctor:  Take an oral rehydration solution (ORS). This is a drink that is sold at pharmacies and stores.  Drink clear fluids in small amounts as you are able, such  as: ? Water. ? Ice chips. ? Fruit juice that has water added (diluted fruit juice). ? Low-calorie sports drinks.  Eat bland, easy to digest foods in small amounts as you are able, such as: ? Bananas. ? Applesauce. ? Rice. ? Lean meats. ? Toast. ? Crackers.  Avoid drinking fluids that contain a lot of sugar or caffeine.  Avoid alcohol.  Avoid spicy or fatty foods.  General instructions  Drink enough fluid to keep your pee (urine)  clear or pale yellow.  Wash your hands often. If you cannot use soap and water, use hand sanitizer.  Make sure that all people in your household wash their hands well and often.  Rest at home while you get better.  Take over-the-counter and prescription medicines only as told by your doctor.  Breathe slowly and deeply when you feel sick to your stomach.  Watch your condition for any changes.  Keep all follow-up visits as told by your doctor. This is important. Contact a doctor if:  You have a headache.  You have new symptoms.  You feel sicker to your stomach.  You have a fever.  You feel light-headed or dizzy.  You throw up.  You are not able to keep fluids down. Get help right away if:  You have pain in your chest, neck, arm, or jaw.  You feel very weak or you pass out (faint).  You have throw up that is bright red or looks like coffee grounds.  You have bloody or black poop (stools), or poop that looks like tar.  You have a very bad headache, a stiff neck, or both.  You have very bad pain, cramping, or bloating in your belly.  You have a rash.  You have trouble breathing or you are breathing very quickly.  Your heart is beating very quickly.  Your skin feels cold and clammy.  You feel confused.  You have pain while peeing.  You have signs of dehydration, such as: ? Dark pee, or very little or no pee. ? Cracked lips. ? Dry mouth. ? Sunken eyes. ? Sleepiness. ? Weakness. These symptoms may be an emergency.  Do not wait to see if the symptoms will go away. Get medical help right away. Call your local emergency services (911 in the [place] Do not drive yourself to the hospital. This information is not intended to replace advice given to you by your health care provider. Make sure you discuss any questions you have with your health care provider. Document Released: 10/20/2011 Document Revised: 04/07/2016 Document Reviewed: 07/07/2015 Elsevier Interactive Patient Education  [employer]2018 [employer]

## 2017-06-23 NOTE — Progress Notes (Addendum)
Subjective:     Patient ID: Melissa Martinez, female   DOB: 01/01/1986, 31 y.o.   MRN: [id number]  HPI  Patient is a 31 year old female in no acute distress who returns to clinic today with complaints of persistent nausea since last office visit for acute care on 06/09/17 at which time she had presented with at least 4 weeks of nausea.   She was hospitalized due to Noro Virus( she reports her entire family had Noro)  on 06/11/17 x 2 days.She reports she did have blood in her vomit was thought to be from her throat at time of admission. She continues to feel weak. She is able to eat but only eating carbohydrates. She denies any vomiting since leaving the hospital. She has eaten bananas for extra potassium as suggested by ER MD.She was also treated for lactic acidosis in hospital that had resolved to normal levels upon discharge.  She had a CT Scan the on 06/09/17 prior to hospitilization showing possible adenitis with lymph enlargement. She is still working everyday per her report. She is drinking well and incorporating Gatorades everyday.  She is urination normally.  She is running low grade temperatures at night 99.8 intermittently  She continues to have some diarrhea for two to 3 years. She reports she  [facility] and that she may have possible Irritable bowel syndrome. Mild headaches occasionally. She remains nauseated everyday at home and it intermittent.  B/p at home 127/80 heart rate 85.   She denies any blood or mucous in stool. She has burping and mild abdominal distension after eating.   She reports Zofran does help with nausea but it does not resolve.She denies any side effects with the Zofran and requests additional refill until she sees primary care.  She is trying to follow BRAT Diet. She reports eating mostly carbohydrates.  Last Physical with Primary Care MD  [facility], due the end of this month to see for physical. He prescribes all of  her medications per patient and see her for primary care maintenance.   LMP- 05/31/17 spotted. Office test 7/27 negative pregnancy test.  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 No known needle sticks or exposure but high risk job. She has had negative Quantiferon for TB recently per her report.   Urine dip was positive for ketones and she reports she took ketones with her morning coffee today.  Patient Active Problem List   Diagnosis Date Noted  . Dehydration 06/12/2017  . Adenitis 05/23/2017  . Closed Colles' fracture of left radius 10/17/2016  . Postpartum care following cesarean delivery 06/25/2016  . Preeclampsia 06/22/2016  . Labor and delivery, indication for care 06/20/2016  . Kidney stones 12/14/2015  . PCOS (polycystic ovarian syndrome) 12/14/2015  . Upper airway cough syndrome 08/17/2015  . Abdominal pain, chronic, generalized 11/11/2014  . Bleeding per rectum 11/11/2014  . Change in bowel habits 11/11/2014  . Fatty infiltration of liver 11/11/2014  . Bright red rectal bleeding 11/11/2014   Allergies  Allergen Reactions  . Levofloxacin Itching and Other (See Comments)    Throat tightening, generalized itching, redness  . Reglan [Metoclopramide] Anxiety    Causes paranoia   . Bactrim [Sulfamethoxazole-Trimethoprim] Itching    Also redness  . Equal [Aspartame] Itching  . Levaquin [Levofloxacin In D5w] Other (See Comments)    Throat tightening, generalized itching, redness  . Sulfa Antibiotics  Blood pressure 128/84, pulse 77, temperature 99.2 F (37.3 C), temperature source Tympanic, last menstrual period 05/31/2017, SpO2 98 %, unknown if currently breastfeeding.  Review of Systems  Constitutional: Positive for fatigue (increased fatigue inthe evening especially, too tired to go to gym now as she previously was) and fever (low grade started this week. 99 is highest ). Negative for activity change and unexpected weight change.  HENT:  Negative.   Eyes: Negative.  Negative for photophobia.  Respiratory: Negative.   Cardiovascular: Negative.  Negative for chest pain, palpitations and leg swelling.  Gastrointestinal: Positive for abdominal distention (after eating), diarrhea (continues two times a day/ mostly after eating.) and nausea. Negative for abdominal pain, anal bleeding, blood in stool, constipation, rectal pain and vomiting.  Endocrine: Negative for cold intolerance, heat intolerance, polydipsia, polyphagia and polyuria.  Genitourinary: Negative for decreased urine volume, difficulty urinating, dyspareunia, dysuria, enuresis, flank pain, frequency, genital sores, hematuria, menstrual problem, pelvic pain, urgency, vaginal bleeding, vaginal discharge and vaginal pain.  Musculoskeletal: Negative for arthralgias (feels achey all over with nausea ), back pain, gait problem, joint swelling, myalgias, neck pain and neck stiffness.  Skin: Negative.   Allergic/Immunologic: Negative for environmental allergies, food allergies and immunocompromised state.  Neurological: Positive for light-headedness (with nausea at times ) and headaches (mild occacsional ). Negative for dizziness, tremors, seizures, syncope, facial asymmetry, speech difficulty, weakness and numbness.  Hematological: Negative.   Psychiatric/Behavioral: Negative.        Objective:   Physical Exam  Constitutional: She is oriented to person, place, and time. Vital signs are normal. She appears well-developed and well-nourished. No distress. She is not intubated.  HENT:  Head: Normocephalic and atraumatic.  Right Ear: Hearing, tympanic membrane, external ear and ear canal normal.  Left Ear: Hearing, tympanic membrane, external ear and ear canal normal.  Mouth/Throat: Oropharynx is clear and moist. No oropharyngeal exudate.  Eyes: Pupils are equal, round, and reactive to light. Conjunctivae, EOM and lids are normal. Right eye exhibits no discharge. Left eye exhibits  no discharge. No scleral icterus.  Neck: Trachea normal, normal range of motion, full passive range of motion without pain and phonation normal. Neck supple. Normal carotid pulses, no hepatojugular reflux and no JVD present. No spinous process tenderness present. Carotid bruit is not present. No tracheal deviation present. No Brudzinski's sign noted. No thyroid mass and no thyromegaly present.  Cardiovascular: Normal rate, regular rhythm, S1 normal, S2 normal, normal heart sounds, intact distal pulses and normal pulses.  Exam reveals no gallop, no distant heart sounds and no friction rub.   No murmur heard. Pulses:      Carotid pulses are 2+ on the right side, and 2+ on the left side.      Radial pulses are 2+ on the right side, and 2+ on the left side.       Femoral pulses are 2+ on the right side, and 2+ on the left side.      Popliteal pulses are 2+ on the right side, and 2+ on the left side.       Dorsalis pedis pulses are 2+ on the right side, and 2+ on the left side.       Posterior tibial pulses are 2+ on the right side, and 2+ on the left side.  Pulmonary/Chest: Effort normal and breath sounds normal. No stridor. No apnea, no tachypnea and no bradypnea. She is not intubated. No respiratory distress. She has no wheezes. She has no rales. She exhibits no  tenderness.  Abdominal: Soft. Normal appearance, normal aorta and bowel sounds are normal. She exhibits distension (mild generalized throughout). She exhibits no shifting dullness, no pulsatile liver, no fluid wave, no abdominal bruit, no ascites, no pulsatile midline mass and no mass. There is no hepatosplenomegaly, splenomegaly or hepatomegaly. There is generalized tenderness (mild with palpation suprapubic). There is no rigidity, no rebound, no guarding, no CVA tenderness, no tenderness at McBurney's point and negative Murphy's sign. No hernia. Hernia confirmed negative in the ventral area.  Abdomen obese   Musculoskeletal: Normal range of  motion. She exhibits no edema, tenderness or deformity.  Patient has normal gait in hall and in exam room. She moves on and off exam table without any difficulty.   Lymphadenopathy:       Head (right side): No submental, no submandibular, no tonsillar, no preauricular, no posterior auricular and no occipital adenopathy present.       Head (left side): No submental, no submandibular, no tonsillar, no preauricular, no posterior auricular and no occipital adenopathy present.    She has no cervical adenopathy.       Right cervical: No superficial cervical, no deep cervical and no posterior cervical adenopathy present.      Left cervical: No superficial cervical, no deep cervical and no posterior cervical adenopathy present.    She has no axillary adenopathy.       Right: No supraclavicular adenopathy present.       Left: No supraclavicular adenopathy present.  Neurological: She is alert and oriented to person, place, and time. She has normal strength and normal reflexes. She displays no atrophy, no tremor and normal reflexes. No cranial nerve deficit or sensory deficit. She exhibits normal muscle tone. She displays a negative Romberg sign. She displays no seizure activity. Coordination and gait normal. GCS eye subscore is 4. GCS verbal subscore is 5. GCS motor subscore is 6.  Reflex Scores:      Tricep reflexes are 2+ on the right side and 2+ on the left side.      Bicep reflexes are 2+ on the right side and 2+ on the left side.      Brachioradialis reflexes are 2+ on the right side and 2+ on the left side.      Patellar reflexes are 2+ on the right side and 2+ on the left side.      Achilles reflexes are 2+ on the right side and 2+ on the left side. Skin: Skin is warm, dry and intact. No rash noted. She is not diaphoretic. No cyanosis or erythema. No pallor. Nails show no clubbing.  Psychiatric: She has a normal mood and affect. Her speech is normal and behavior is normal. Judgment and thought  content normal. Cognition and memory are normal.     Assessment:   1. Persistent nausea with occasional light headiness.   Plan:   1. Advised patient she needs to call her Primary Care Physician for an appointment and  Further evaluation.   2. Will check labs to rule out urgent considerations with electrolytes due to recent hospitalization and persistent nausea with history. She is advised to monitor blood glucose   3. Referral to Gastrointestinal MD urgent. CT Scan on 06/09/17 results in Epic, as well as persistent nausea.  4. 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   5. Zofran 4 mg oral dissolving tablet #  10 E prescribed as needed for nausea. Will need to see PCP for additional refills.  Only Zofran ordered this encounter as below:   Meds ordered this encounter  Medications  . acetaminophen (TYLENOL) 500 MG tablet    Sig: Take 1,000 mg by mouth every 6 (six) hours as needed.  . Multiple Vitamins-Minerals (WOMENS MULTIVITAMIN PO)    Sig: Take by mouth.  . ondansetron (ZOFRAN-ODT) 4 MG disintegrating tablet    Sig: Take 1 tablet (4 mg total) by mouth every 8 (eight) hours as needed for nausea or vomiting (may cause drowsiness).    Dispense:  10 tablet    Refill:  0   6. Advised to seek 911 for persistent light headiness or any near syncopal episodes, acute distress or any worsening symptoms. Advised Emergency Room or Urgent care for any changing or worsening symptoms. Follow up with Primary Care as soon as possible and call back if you have not heard from office regarding referral.   Patient verbalized understanding of all instructions and denies any further questions at this time.    Lab Orders     Urine Culture     H Pylori, IGM, IGG, IGA AB     CBC with Differential/Platelet     Comprehensive metabolic panel     C-reactive protein     Magnesium     HIV  antibody     Hepatitis, Diagnostic (Prof I)   Patient has been discussed with supervising MD Julieanne Manson and he is in agreement with plan.     While patient having labs drawn she advised she was able to get an appointment with Dr. [name] her PCP this afternoon at 3:40 pm and will keep that appointment.

## 2017-06-27 DIAGNOSIS — M791 Myalgia: Secondary | ICD-10-CM | POA: Diagnosis not present

## 2017-06-27 DIAGNOSIS — M4608 Spinal enthesopathy, sacral and sacrococcygeal region: Secondary | ICD-10-CM | POA: Diagnosis not present

## 2017-06-27 DIAGNOSIS — M9904 Segmental and somatic dysfunction of sacral region: Secondary | ICD-10-CM | POA: Diagnosis not present

## 2017-06-27 DIAGNOSIS — M9907 Segmental and somatic dysfunction of upper extremity: Secondary | ICD-10-CM | POA: Diagnosis not present

## 2017-06-27 LAB — CBC WITH DIFFERENTIAL/PLATELET
BASOS ABS: 0 10*3/uL (ref 0.0–0.2)
Basos: 0 %
EOS (ABSOLUTE): 0.2 10*3/uL (ref 0.0–0.4)
Eos: 2 %
HEMOGLOBIN: 12.8 g/dL (ref 11.1–15.9)
Hematocrit: 37.3 % (ref 34.0–46.6)
Immature Grans (Abs): 0 10*3/uL (ref 0.0–0.1)
Immature Granulocytes: 0 %
LYMPHS ABS: 3 10*3/uL (ref 0.7–3.1)
Lymphs: 37 %
MCH: 28.5 pg (ref 26.6–33.0)
MCHC: 34.3 g/dL (ref 31.5–35.7)
MCV: 83 fL (ref 79–97)
MONOCYTES: 7 %
Monocytes Absolute: 0.6 10*3/uL (ref 0.1–0.9)
NEUTROS ABS: 4.3 10*3/uL (ref 1.4–7.0)
Neutrophils: 54 %
Platelets: 395 10*3/uL — ABNORMAL HIGH (ref 150–379)
RBC: 4.49 x10E6/uL (ref 3.77–5.28)
RDW: 13.7 % (ref 12.3–15.4)
WBC: 8 10*3/uL (ref 3.4–10.8)

## 2017-06-27 LAB — COMPREHENSIVE METABOLIC PANEL
ALBUMIN: 4.3 g/dL (ref 3.5–5.5)
ALT: 21 IU/L (ref 0–32)
AST: 24 IU/L (ref 0–40)
Albumin/Globulin Ratio: 1.3 (ref 1.2–2.2)
Alkaline Phosphatase: 78 IU/L (ref 39–117)
BUN / CREAT RATIO: 12 (ref 9–23)
BUN: 8 mg/dL (ref 6–20)
CALCIUM: 9.4 mg/dL (ref 8.7–10.2)
CO2: 21 mmol/L (ref 20–29)
CREATININE: 0.67 mg/dL (ref 0.57–1.00)
Chloride: 100 mmol/L (ref 96–106)
GFR, EST AFRICAN AMERICAN: 135 mL/min/{1.73_m2} (ref >59)
GFR, EST NON AFRICAN AMERICAN: 117 mL/min/{1.73_m2} (ref >59)
GLUCOSE: 112 mg/dL — AB (ref 65–99)
Globulin, Total: 3.2 g/dL (ref 1.5–4.5)
Potassium: 4.2 mmol/L (ref 3.5–5.2)
Sodium: 140 mmol/L (ref 134–144)
TOTAL PROTEIN: 7.5 g/dL (ref 6.0–8.5)

## 2017-06-27 LAB — C-REACTIVE PROTEIN: CRP: 10.7 mg/L — ABNORMAL HIGH (ref 0.0–4.9)

## 2017-06-27 LAB — HEPATITIS, DIAGNOSTIC (PROF I)
HEP A IGM: NEGATIVE
HEP B C IGM: NEGATIVE
HEP B S AG: NEGATIVE

## 2017-06-27 LAB — H PYLORI, IGM, IGG, IGA AB
H. PYLORI, IGM ABS: 11.6 U — AB (ref 0.0–8.9)
H. pylori, IgG AbS: <0.8 Index Value (ref 0.00–0.79)

## 2017-06-27 LAB — URINE CULTURE: ORGANISM ID, BACTERIA: NO GROWTH

## 2017-06-27 LAB — MAGNESIUM: Magnesium: 1.9 mg/dL (ref 1.6–2.3)

## 2017-06-27 LAB — HIV ANTIBODY (ROUTINE TESTING W REFLEX): HIV SCREEN 4TH GENERATION: NONREACTIVE

## 2017-06-28 ENCOUNTER — Encounter: Payer: Self-pay | Admitting: Adult Health

## 2017-06-28 ENCOUNTER — Telehealth: Payer: Self-pay | Admitting: Adult Health

## 2017-06-28 MED ORDER — OMEPRAZOLE 20 MG PO CPDR: 20.0000 mg | DELAYED_RELEASE_CAPSULE | Freq: Every day | ORAL | 1 refills | Status: DC

## 2017-06-28 NOTE — Telephone Encounter (Signed)
Spoke with patient and she has a GI appointment on 07/05/17 with Dr. Jerelene Redden and has an appointment with her primary care Dr. Kenton Kingfisher on 07/06/17 - she missed her appointment on 06/26/17 due to her schedule. She denies any change in her symptoms since her previous appointment on Friday 06/23/17.   Discussed labs as below. Recent Results (from the past 2160 hour(s))  CBC With Differential     Status: None   Collection Time: 05/30/17 12:00 AM  Result Value Ref Range   WBC 7.8 3.4 - 10.8 x10E3/uL   RBC 4.50 3.77 - 5.28 x10E6/uL   Hemoglobin 12.5 11.1 - 15.9 g/dL   Hematocrit 38.3 34.0 - 46.6 %   MCV 85 79 - 97 fL   MCH 27.8 26.6 - 33.0 pg   MCHC 32.6 31.5 - 35.7 g/dL   RDW 13.7 12.3 - 15.4 %   Neutrophils 52 Not Estab. %   Lymphs 38 Not Estab. %   Monocytes 7 Not Estab. %   Eos 3 Not Estab. %   Basos 0 Not Estab. %   Neutrophils Absolute 4.1 1.4 - 7.0 x10E3/uL   Lymphocytes Absolute 2.9 0.7 - 3.1 x10E3/uL   Monocytes Absolute 0.5 0.1 - 0.9 x10E3/uL   EOS (ABSOLUTE) 0.2 0.0 - 0.4 x10E3/uL   Basophils Absolute 0.0 0.0 - 0.2 x10E3/uL   Immature Granulocytes 0 Not Estab. %   Immature Grans (Abs) 0.0 0.0 - 0.1 x10E3/uL  VITAMIN D 25 Hydroxy (Vit-D Deficiency, Fractures)     Status: None   Collection Time: 05/30/17 12:00 AM  Result Value Ref Range   Vit D, 25-Hydroxy 33.3 30.0 - 100.0 ng/mL    Comment: Vitamin D deficiency has been defined by the Institute of Medicine and an Endocrine Society practice guideline as a level of serum 25-OH vitamin D less than 20 ng/mL (1,2). The Endocrine Society went on to further define vitamin D insufficiency as a level between 21 and 29 ng/mL (2). 1. IOM (Institute of Medicine). 2010. Dietary reference    intakes for calcium and D. Vian: The    [employer]. 2. Holick MF, Binkley Wauna, Bischoff-Ferrari HA, et al.    Evaluation, treatment, and prevention of vitamin D    deficiency: an Endocrine Society clinical practice    guideline.  JCEM. 2011 Jul; 96(7):1911-30.   Lipid panel     Status: Abnormal   Collection Time: 05/30/17 12:00 AM  Result Value Ref Range   Cholesterol, Total 198 100 - 199 mg/dL   Triglycerides 356 (H) 0 - 149 mg/dL   HDL 35 (L) >39 mg/dL   VLDL Cholesterol Cal 71 (H) 5 - 40 mg/dL   LDL Calculated 92 0 - 99 mg/dL   Chol/HDL Ratio 5.7 (H) 0.0 - 4.4 ratio    Comment:                                   T. Chol/HDL Ratio                                             Men  Women                               1/2 Avg.Risk  3.4  3.3                                   Avg.Risk  5.0    4.4                                2X Avg.Risk  9.6    7.1                                3X Avg.Risk 23.4   11.0   Hemoglobin A1c     Status: None   Collection Time: 05/30/17 12:00 AM  Result Value Ref Range   Hgb A1c MFr Bld 5.6 4.8 - 5.6 %    Comment:          Pre-diabetes: 5.7 - 6.4          Diabetes: >6.4          Glycemic control for adults with diabetes: <7.0    Est. average glucose Bld gHb Est-mCnc 114 mg/dL  CULTURE, URINE COMPREHENSIVE     Status: None   Collection Time: 06/09/17 11:32 AM  Result Value Ref Range   Urine Culture, Comprehensive Final report    Organism ID, Bacteria Comment     Comment: Mixed urogenital flora 25,000-50,000 colony forming units per mL   Lipase     Status: None   Collection Time: 06/09/17 11:32 AM  Result Value Ref Range   Lipase 23 14 - 72 U/L  Amylase     Status: None   Collection Time: 06/09/17 11:32 AM  Result Value Ref Range   Amylase 72 31 - 124 U/L  CBC w/Diff     Status: None   Collection Time: 06/09/17 11:32 AM  Result Value Ref Range   WBC 8.3 3.4 - 10.8 x10E3/uL   RBC 4.69 3.77 - 5.28 x10E6/uL   Hemoglobin 13.2 11.1 - 15.9 g/dL   Hematocrit 38.6 34.0 - 46.6 %   MCV 82 79 - 97 fL   MCH 28.1 26.6 - 33.0 pg   MCHC 34.2 31.5 - 35.7 g/dL   RDW 14.0 12.3 - 15.4 %   Platelets 341 150 - 379 x10E3/uL   Neutrophils 61 Not Estab. %   Lymphs 31 Not Estab. %    Monocytes 6 Not Estab. %   Eos 2 Not Estab. %   Basos 0 Not Estab. %   Neutrophils Absolute 5.0 1.4 - 7.0 x10E3/uL   Lymphocytes Absolute 2.6 0.7 - 3.1 x10E3/uL   Monocytes Absolute 0.5 0.1 - 0.9 x10E3/uL   EOS (ABSOLUTE) 0.2 0.0 - 0.4 x10E3/uL   Basophils Absolute 0.0 0.0 - 0.2 x10E3/uL   Immature Granulocytes 0 Not Estab. %   Immature Grans (Abs) 0.0 0.0 - 0.1 x10E3/uL  Comp Met (CMET)     Status: None   Collection Time: 06/09/17 11:32 AM  Result Value Ref Range   Glucose 94 65 - 99 mg/dL   BUN 12 6 - 20 mg/dL   Creatinine, Ser 0.71 0.57 - 1.00 mg/dL   GFR calc non Af Amer 113 >59 mL/min/1.73   GFR calc Af Amer 130 >59 mL/min/1.73   BUN/Creatinine Ratio 17 9 - 23   Sodium 140 134 - 144 mmol/L   Potassium 4.6 3.5 - 5.2 mmol/L   Chloride 100 96 -  106 mmol/L   CO2 20 20 - 29 mmol/L   Calcium 9.4 8.7 - 10.2 mg/dL   Total Protein 7.5 6.0 - 8.5 g/dL   Albumin 4.3 3.5 - 5.5 g/dL   Globulin, Total 3.2 1.5 - 4.5 g/dL   Albumin/Globulin Ratio 1.3 1.2 - 2.2   Bilirubin Total <0.2 0.0 - 1.2 mg/dL   Alkaline Phosphatase 70 39 - 117 IU/L   AST 34 0 - 40 IU/L   ALT 22 0 - 32 IU/L  C-reactive protein     Status: Abnormal   Collection Time: 06/09/17 11:32 AM  Result Value Ref Range   CRP 18.0 (H) 0.0 - 4.9 mg/L  POCT urine pregnancy     Status: Normal   Collection Time: 06/09/17 12:44 PM  Result Value Ref Range   Preg Test, Ur Negative Negative  POCT urinalysis dipstick     Status: Abnormal   Collection Time: 06/09/17 12:47 PM  Result Value Ref Range   Color, UA LT YELLOW    Clarity, UA CLEAR    Glucose, UA NEG    Bilirubin, UA NEG    Ketones, UA NEG    Spec Grav, UA 1.015 1.010 - 1.025   Blood, UA TR    pH, UA 6.0 5.0 - 8.0   Protein, UA NEG    Urobilinogen, UA 0.2 0.2 or 1.0 E.U./dL   Nitrite, UA NEG    Leukocytes, UA Trace (A) Negative  Lipase, blood     Status: None   Collection Time: 06/12/17 12:24 AM  Result Value Ref Range   Lipase 22 11 - 51 U/L  Comprehensive  metabolic panel     Status: Abnormal   Collection Time: 06/12/17 12:24 AM  Result Value Ref Range   Sodium 141 135 - 145 mmol/L   Potassium 4.3 3.5 - 5.1 mmol/L   Chloride 106 101 - 111 mmol/L   CO2 21 (L) 22 - 32 mmol/L   Glucose, Bld 127 (H) 65 - 99 mg/dL   BUN 13 6 - 20 mg/dL   Creatinine, Ser 0.84 0.44 - 1.00 mg/dL   Calcium 9.6 8.9 - 10.3 mg/dL   Total Protein 8.8 (H) 6.5 - 8.1 g/dL   Albumin 4.3 3.5 - 5.0 g/dL   AST 60 (H) 15 - 41 U/L   ALT 34 14 - 54 U/L   Alkaline Phosphatase 72 38 - 126 U/L   Total Bilirubin 0.6 0.3 - 1.2 mg/dL   GFR calc non Af Amer >60 >60 mL/min   GFR calc Af Amer >60 >60 mL/min    Comment: (NOTE) The eGFR has been calculated using the CKD EPI equation. This calculation has not been validated in all clinical situations. eGFR's persistently <60 mL/min signify possible Chronic Kidney Disease.    Anion gap 14 5 - 15  CBC     Status: Abnormal   Collection Time: 06/12/17 12:24 AM  Result Value Ref Range   WBC 11.5 (H) 3.6 - 11.0 K/uL   RBC 5.23 (H) 3.80 - 5.20 MIL/uL   Hemoglobin 14.8 12.0 - 16.0 g/dL   HCT 43.5 35.0 - 47.0 %   MCV 83.2 80.0 - 100.0 fL   MCH 28.2 26.0 - 34.0 pg   MCHC 33.9 32.0 - 36.0 g/dL   RDW 13.3 11.5 - 14.5 %   Platelets 313 150 - 440 K/uL  hCG, quantitative, pregnancy     Status: None   Collection Time: 06/12/17 12:24 AM  Result Value Ref Range  hCG, Beta Chain, Quant, S <1 <5 mIU/mL    Comment:          GEST. AGE      CONC.  (mIU/mL)   <=1 WEEK        5 - 50     2 WEEKS       50 - 500     3 WEEKS       100 - 10,000     4 WEEKS     1,000 - 30,000     5 WEEKS     3,500 - 115,000   6-8 WEEKS     12,000 - 270,000    12 WEEKS     15,000 - 220,000        FEMALE AND NON-PREGNANT FEMALE:     LESS THAN 5 mIU/mL   TSH     Status: None   Collection Time: 06/12/17 12:24 AM  Result Value Ref Range   TSH 1.010 0.350 - 4.500 uIU/mL    Comment: Performed by a 3rd Generation assay with a functional sensitivity of <=0.01 uIU/mL.   Lactic acid, plasma     Status: Abnormal   Collection Time: 06/12/17  2:42 AM  Result Value Ref Range   Lactic Acid, Venous 2.9 (HH) 0.5 - 1.9 mmol/L    Comment: CRITICAL RESULT CALLED TO, READ BACK BY AND VERIFIED WITH Margarita Grizzle ALLEN AT 9381 06/12/17.PMH  Urinalysis, Complete w Microscopic     Status: Abnormal   Collection Time: 06/12/17  3:44 AM  Result Value Ref Range   Color, Urine YELLOW (A) YELLOW   APPearance CLEAR (A) CLEAR   Specific Gravity, Urine 1.019 1.005 - 1.030   pH 5.0 5.0 - 8.0   Glucose, UA NEGATIVE NEGATIVE mg/dL   Hgb urine dipstick NEGATIVE NEGATIVE   Bilirubin Urine NEGATIVE NEGATIVE   Ketones, ur 20 (A) NEGATIVE mg/dL   Protein, ur NEGATIVE NEGATIVE mg/dL   Nitrite NEGATIVE NEGATIVE   Leukocytes, UA NEGATIVE NEGATIVE   RBC / HPF 0-5 0 - 5 RBC/hpf   WBC, UA 0-5 0 - 5 WBC/hpf   Bacteria, UA RARE (A) NONE SEEN   Squamous Epithelial / LPF 0-5 (A) NONE SEEN   Mucous PRESENT   Lactic acid, plasma     Status: Abnormal   Collection Time: 06/12/17  6:01 AM  Result Value Ref Range   Lactic Acid, Venous 2.7 (HH) 0.5 - 1.9 mmol/L    Comment: CRITICAL RESULT CALLED TO, READ BACK BY AND VERIFIED WITH LAURIE ALLEN AT 0175 06/12/17.PMH  Glucose, capillary     Status: Abnormal   Collection Time: 06/12/17  8:13 AM  Result Value Ref Range   Glucose-Capillary 129 (H) 65 - 99 mg/dL  Glucose, capillary     Status: Abnormal   Collection Time: 06/12/17 12:00 PM  Result Value Ref Range   Glucose-Capillary 110 (H) 65 - 99 mg/dL  Gastrointestinal Panel by PCR , Stool     Status: Abnormal   Collection Time: 06/12/17  3:30 PM  Result Value Ref Range   Campylobacter species NOT DETECTED NOT DETECTED   Plesimonas shigelloides NOT DETECTED NOT DETECTED   Salmonella species NOT DETECTED NOT DETECTED   Yersinia enterocolitica NOT DETECTED NOT DETECTED   Vibrio species NOT DETECTED NOT DETECTED   Vibrio cholerae NOT DETECTED NOT DETECTED   Enteroaggregative E coli (EAEC) NOT DETECTED  NOT DETECTED   Enteropathogenic E coli (EPEC) NOT DETECTED NOT DETECTED   Enterotoxigenic E coli (ETEC) NOT  DETECTED NOT DETECTED   Shiga like toxin producing E coli (STEC) NOT DETECTED NOT DETECTED   Shigella/Enteroinvasive E coli (EIEC) NOT DETECTED NOT DETECTED   Cryptosporidium NOT DETECTED NOT DETECTED   Cyclospora cayetanensis NOT DETECTED NOT DETECTED   Entamoeba histolytica NOT DETECTED NOT DETECTED   Giardia lamblia NOT DETECTED NOT DETECTED   Adenovirus F40/41 NOT DETECTED NOT DETECTED   Astrovirus NOT DETECTED NOT DETECTED   Norovirus GI/GII DETECTED (A) NOT DETECTED    Comment: RESULT CALLED TO, READ BACK BY AND VERIFIED WITH: DEIDRA MALCOLM AT 1750 06/12/2017 BY TFK.    Rotavirus A NOT DETECTED NOT DETECTED   Sapovirus (I, II, IV, and V) NOT DETECTED NOT DETECTED  Glucose, capillary     Status: Abnormal   Collection Time: 06/12/17  4:50 PM  Result Value Ref Range   Glucose-Capillary 116 (H) 65 - 99 mg/dL  Glucose, capillary     Status: None   Collection Time: 06/12/17  9:34 PM  Result Value Ref Range   Glucose-Capillary 99 65 - 99 mg/dL  CBC     Status: Abnormal   Collection Time: 06/13/17  4:53 AM  Result Value Ref Range   WBC 6.1 3.6 - 11.0 K/uL   RBC 4.01 3.80 - 5.20 MIL/uL   Hemoglobin 11.4 (L) 12.0 - 16.0 g/dL   HCT 33.0 (L) 35.0 - 47.0 %   MCV 82.3 80.0 - 100.0 fL   MCH 28.6 26.0 - 34.0 pg   MCHC 34.7 32.0 - 36.0 g/dL   RDW 13.6 11.5 - 14.5 %   Platelets 225 150 - 440 K/uL  Basic metabolic panel     Status: Abnormal   Collection Time: 06/13/17  4:53 AM  Result Value Ref Range   Sodium 138 135 - 145 mmol/L   Potassium 2.9 (L) 3.5 - 5.1 mmol/L   Chloride 110 101 - 111 mmol/L   CO2 22 22 - 32 mmol/L   Glucose, Bld 119 (H) 65 - 99 mg/dL   BUN 5 (L) 6 - 20 mg/dL   Creatinine, Ser 0.68 0.44 - 1.00 mg/dL   Calcium 7.1 (L) 8.9 - 10.3 mg/dL   GFR calc non Af Amer >60 >60 mL/min   GFR calc Af Amer >60 >60 mL/min    Comment: (NOTE) The eGFR has been  calculated using the CKD EPI equation. This calculation has not been validated in all clinical situations. eGFR's persistently <60 mL/min signify possible Chronic Kidney Disease.    Anion gap 6 5 - 15  Lactic acid, plasma     Status: None   Collection Time: 06/13/17  4:53 AM  Result Value Ref Range   Lactic Acid, Venous 1.8 0.5 - 1.9 mmol/L  Magnesium     Status: Abnormal   Collection Time: 06/13/17  4:53 AM  Result Value Ref Range   Magnesium 1.4 (L) 1.7 - 2.4 mg/dL  Glucose, capillary     Status: Abnormal   Collection Time: 06/13/17  7:46 AM  Result Value Ref Range   Glucose-Capillary 102 (H) 65 - 99 mg/dL  Potassium     Status: None   Collection Time: 06/13/17  2:04 PM  Result Value Ref Range   Potassium 3.5 3.5 - 5.1 mmol/L  H Pylori, IGM, IGG, IGA AB     Status: Abnormal   Collection Time: 06/23/17 12:02 PM  Result Value Ref Range   H. pylori, IgG AbS <0.80 0.00 - 0.79 Index Value    Comment:  Negative           <0.80                              Equivocal    0.80 - 0.89                              Positive           >0.89    H. pylori, IgA Abs <9.0 0.0 - 8.9 units    Comment:                                 Negative          <9.0                                 Equivocal   9.0 - 11.0                                 Positive         >11.0    H pylori, IgM Abs 11.6 (H) 0.0 - 8.9 units    Comment:                                 Negative          <9.0                                 Equivocal   9.0 - 11.0                                 Positive         >11.0 This test was developed and its performance characteristics determined by [facility]. It has not been cleared or approved by the Food and Drug Administration.   CBC with Differential/Platelet     Status: Abnormal   Collection Time: 06/23/17 12:02 PM  Result Value Ref Range   WBC 8.0 3.4 - 10.8 x10E3/uL   RBC 4.49 3.77 - 5.28 x10E6/uL   Hemoglobin 12.8 11.1 - 15.9 g/dL   Hematocrit  37.3 34.0 - 46.6 %   MCV 83 79 - 97 fL   MCH 28.5 26.6 - 33.0 pg   MCHC 34.3 31.5 - 35.7 g/dL   RDW 13.7 12.3 - 15.4 %   Platelets 395 (H) 150 - 379 x10E3/uL   Neutrophils 54 Not Estab. %   Lymphs 37 Not Estab. %   Monocytes 7 Not Estab. %   Eos 2 Not Estab. %   Basos 0 Not Estab. %   Neutrophils Absolute 4.3 1.4 - 7.0 x10E3/uL   Lymphocytes Absolute 3.0 0.7 - 3.1 x10E3/uL   Monocytes Absolute 0.6 0.1 - 0.9 x10E3/uL   EOS (ABSOLUTE) 0.2 0.0 - 0.4 x10E3/uL   Basophils Absolute 0.0 0.0 - 0.2 x10E3/uL   Immature Granulocytes 0 Not Estab. %   Immature Grans (Abs) 0.0 0.0 - 0.1 x10E3/uL  Comprehensive metabolic panel     Status: Abnormal   Collection  Time: 06/23/17 12:02 PM  Result Value Ref Range   Glucose 112 (H) 65 - 99 mg/dL   BUN 8 6 - 20 mg/dL   Creatinine, Ser 0.67 0.57 - 1.00 mg/dL   GFR calc non Af Amer 117 >59 mL/min/1.73   GFR calc Af Amer 135 >59 mL/min/1.73   BUN/Creatinine Ratio 12 9 - 23   Sodium 140 134 - 144 mmol/L   Potassium 4.2 3.5 - 5.2 mmol/L   Chloride 100 96 - 106 mmol/L   CO2 21 20 - 29 mmol/L   Calcium 9.4 8.7 - 10.2 mg/dL   Total Protein 7.5 6.0 - 8.5 g/dL   Albumin 4.3 3.5 - 5.5 g/dL   Globulin, Total 3.2 1.5 - 4.5 g/dL   Albumin/Globulin Ratio 1.3 1.2 - 2.2   Bilirubin Total <0.2 0.0 - 1.2 mg/dL   Alkaline Phosphatase 78 39 - 117 IU/L   AST 24 0 - 40 IU/L   ALT 21 0 - 32 IU/L  C-reactive protein     Status: Abnormal   Collection Time: 06/23/17 12:02 PM  Result Value Ref Range   CRP 10.7 (H) 0.0 - 4.9 mg/L  Magnesium     Status: None   Collection Time: 06/23/17 12:02 PM  Result Value Ref Range   Magnesium 1.9 1.6 - 2.3 mg/dL  HIV antibody     Status: None   Collection Time: 06/23/17 12:02 PM  Result Value Ref Range   HIV Screen 4th Generation wRfx Non Reactive Non Reactive  Hepatitis, Diagnostic (Prof I)     Status: None   Collection Time: 06/23/17 12:02 PM  Result Value Ref Range   Hep A IgM Negative Negative   Hepatitis B Surface Ag  Negative Negative   Hep B C IgM Negative Negative  Urine Culture     Status: None   Collection Time: 06/23/17 12:02 PM  Result Value Ref Range   Urine Culture, Routine Final report    Organism ID, Bacteria No growth    Advised will start Omeprazole 20 mg daily , she is in agreement and will keep her appointment with GI and Primary Care.  HIV, HEP A, B and C are negative per  lab work.  CRP trending down from previous.  Platelets were mildly elevated she was on Heparin in hospital. PCP will need to recheck. She denies any bleeding or bruising.  IgM for H Pylori was 11.9 - advised patient she may need to have a H-Pylori breath test done with GI.  She will follow up with primary care or urgent care/ emergency room if any symptoms change or worsen.  Patient verbalized understanding of all of the above and denies any further questions at this time.  The above plan has been thoroughly discussed with supervising physician Miguel Aschoff MD and he is in agreement with plan.

## 2017-06-29 ENCOUNTER — Telehealth: Payer: Self-pay

## 2017-06-29 DIAGNOSIS — R197 Diarrhea, unspecified: Secondary | ICD-10-CM | POA: Diagnosis not present

## 2017-06-29 DIAGNOSIS — R110 Nausea: Secondary | ICD-10-CM | POA: Diagnosis not present

## 2017-06-29 DIAGNOSIS — R7982 Elevated C-reactive protein (CRP): Secondary | ICD-10-CM | POA: Diagnosis not present

## 2017-06-29 DIAGNOSIS — R1031 Right lower quadrant pain: Secondary | ICD-10-CM | POA: Diagnosis not present

## 2017-06-29 NOTE — Telephone Encounter (Signed)
-----   Message from Berniece PapMichelle S Flinchum, FNP sent at 06/28/2017  3:14 PM EDT ----- [facility]ney[facility],  I have discussed all these labs with patient. She has appointment with GI and with her primary care. Will you just let her know that I called in Omeprazole 10 mg tablet take one daily as we discussed.

## 2017-06-30 ENCOUNTER — Other Ambulatory Visit
Admission: RE | Admit: 2017-06-30 | Discharge: 2017-06-30 | Disposition: A | Discharge status: Home / Self Care | Payer: BLUE CROSS/BLUE SHIELD | Source: Ambulatory Visit | Attending: Unknown Physician Specialty | Admitting: Unknown Physician Specialty

## 2017-06-30 DIAGNOSIS — R1031 Right lower quadrant pain: Secondary | ICD-10-CM | POA: Diagnosis not present

## 2017-06-30 DIAGNOSIS — R110 Nausea: Secondary | ICD-10-CM | POA: Insufficient documentation

## 2017-06-30 DIAGNOSIS — G8929 Other chronic pain: Secondary | ICD-10-CM | POA: Insufficient documentation

## 2017-06-30 DIAGNOSIS — R197 Diarrhea, unspecified: Secondary | ICD-10-CM | POA: Diagnosis not present

## 2017-06-30 DIAGNOSIS — R7982 Elevated C-reactive protein (CRP): Secondary | ICD-10-CM | POA: Diagnosis not present

## 2017-07-03 DIAGNOSIS — R110 Nausea: Secondary | ICD-10-CM | POA: Insufficient documentation

## 2017-07-05 ENCOUNTER — Encounter: Payer: Self-pay | Admitting: Obstetrics & Gynecology

## 2017-07-05 ENCOUNTER — Ambulatory Visit (INDEPENDENT_AMBULATORY_CARE_PROVIDER_SITE_OTHER): Payer: BLUE CROSS/BLUE SHIELD | Admitting: Obstetrics & Gynecology

## 2017-07-05 VITALS — BP 120/80 | HR 72 | Ht 68.0 in | Wt 261.0 lb

## 2017-07-05 DIAGNOSIS — R1031 Right lower quadrant pain: Secondary | ICD-10-CM

## 2017-07-05 DIAGNOSIS — R112 Nausea with vomiting, unspecified: Secondary | ICD-10-CM

## 2017-07-05 NOTE — Progress Notes (Signed)
07/06/2017 7:44 AM   Melissa Martinez June 14, 1986 [id number]  Referring provider: [name], MD [address] [place], [place] [address]  Chief Complaint  Patient presents with  . Nephrolithiasis    patient in pain thinks she has a kidney stone last seen 12/2015    HPI: Patient is a 31 year old Caucasian female who presents today complaining of RLQ and is concerned it may be a stone.  She states she has been having kidney pain for the last two weeks.  She is having bilateral flank.  Pain is 5/10.  Narcotics help the pain.  Nothing makes it worse.   She is having bladder spasms in the evening.  The is pain goes from dull to sharp.  The pain can last for a couple of hours.    Contrast CT performed on 06/09/2017 noted nonspecific lymph nodes of the ileo colic nodal station, none of which measure larger than 6 mm. The appendix is normal. This pattern can be seen with mesenteric adenitis, though typically is associated with younger patient population. If there is concern for other hematologic disorder, correlation with lab values may be useful.  Hepatomegaly and steatosis.  She saw Dr. [name] yesterday and GC/chlamydia and pelvic US are pending.    Her UA today is negative.  Her PVR is 0 mL.    Reviewed referral notes -   PMH: Past Medical History:  Diagnosis Date  . Abdominal pain, generalized 11/11/2014  . Altered bowel function 11/11/2014  . Anxiety    tkes Prozac, for OCD related anxiety  . Bleeding per rectum 11/11/2014  . Calculus of kidney 12/14/2015  . Complication of anesthesia   . Fatty infiltration of liver 11/11/2014  . PCOS (polycystic ovarian syndrome)   . Upper airway cough syndrome 08/17/2015   Followed in Pulmonary clinic/ [facility]/ [facility]      Surgical History: Past Surgical History:  Procedure Laterality Date  . CESAREAN SECTION N/A 06/22/2016   Procedure: CESAREAN SECTION;  Surgeon: [name], MD;  Location: [facility] ORS;  Service:  Obstetrics;  Laterality: N/A;  . CHOLECYSTECTOMY    . COLONOSCOPY  09/2014  . DILATION AND CURETTAGE OF UTERUS  2013  . HYSTEROSCOPY  2015  . LAPAROSCOPIC CHOLECYSTECTOMY  06/2009  . STENT PLACE LEFT URETER ([facility] HX) Left 11/2012   has had 2 stones pass stones    Home Medications:  Allergies as of 07/06/2017      Reactions   Levofloxacin Itching, Other (See Comments)   Throat tightening, generalized itching, redness   Reglan [metoclopramide] Anxiety   Causes paranoia    Bactrim [sulfamethoxazole-trimethoprim] Itching   Also redness   Equal [aspartame] Itching   Levaquin [levofloxacin In D5w] Other (See Comments)   Throat tightening, generalized itching, redness   Sulfa Antibiotics       Medication List       Accurate as of 07/06/17 11:59 PM. Always use your most recent med list.          acetaminophen 500 MG tablet Commonly known as:  TYLENOL Take 1,000 mg by mouth every 6 (six) hours as needed.   cetirizine 10 MG tablet Commonly known as:  ZYRTEC Take 10 mg by mouth daily.   FLUoxetine 10 MG tablet Commonly known as:  PROZAC Take 20 mg by mouth at bedtime.   GIANVI 3-0.02 MG tablet Generic drug:  drospirenone-ethinyl estradiol Take 1 tablet by mouth daily.   KRILL OIL PO Take by mouth.  metFORMIN 500 MG 24 hr tablet Commonly known as:  GLUCOPHAGE-XR Take 500 mg by mouth daily.   omeprazole 20 MG capsule Commonly known as:  PRILOSEC Take 1 capsule (20 mg total) by mouth daily.   ondansetron 4 MG disintegrating tablet Commonly known as:  ZOFRAN-ODT Take 1 tablet (4 mg total) by mouth every 8 (eight) hours as needed for nausea or vomiting (may cause drowsiness).   PRENATAL VITAMIN PO Take 1 tablet by mouth daily.   Vitamin D3 2000 units capsule Take 4,000 Units by mouth daily.   WOMENS MULTIVITAMIN PO Take by mouth.            Discharge Care Instructions        Start     Ordered   07/06/17 0000  Urinalysis, Complete     07/06/17 1058    07/06/17 0000  BLADDER SCAN AMB NON-IMAGING     07/06/17 1058   07/06/17 0000  CULTURE, URINE COMPREHENSIVE     07/06/17 1131   07/06/17 0000  Microscopic Examination     07/06/17 0000      Allergies:  Allergies  Allergen Reactions  . Levofloxacin Itching and Other (See Comments)    Throat tightening, generalized itching, redness  . Reglan [Metoclopramide] Anxiety    Causes paranoia   . Bactrim [Sulfamethoxazole-Trimethoprim] Itching    Also redness  . Equal [Aspartame] Itching  . Levaquin [Levofloxacin In D5w] Other (See Comments)    Throat tightening, generalized itching, redness  . Sulfa Antibiotics     Family History: Family History  Problem Relation Age of Onset  . Heart disease Father   . Hyperlipidemia Father   . Breast cancer Maternal Grandmother   . Diabetes Maternal Grandmother   . Bladder Cancer Maternal Grandfather   . Heart disease Maternal Grandfather   . Kidney Stones Mother   . Hypertension Mother   . Hypertension Brother   . Kidney cancer Neg Hx     Social History:  reports that she has never smoked. She has never used smokeless tobacco. She reports that she drinks alcohol. She reports that she does not use drugs.  ROS: UROLOGY Frequent Urination?: No Hard to postpone urination?: No Burning/pain with urination?: No Get up at night to urinate?: No Leakage of urine?: No Urine stream starts and stops?: No Trouble starting stream?: No Do you have to strain to urinate?: No Blood in urine?: No Urinary tract infection?: No Sexually transmitted disease?: No Injury to kidneys or bladder?: No Painful intercourse?: No Weak stream?: No Currently pregnant?: No Vaginal bleeding?: No Last menstrual period?: n  Gastrointestinal Nausea?: Yes Vomiting?: No Indigestion/heartburn?: No Diarrhea?: No Constipation?: No  Constitutional Fever: No Night sweats?: No Weight loss?: No Fatigue?: No  Skin Skin rash/lesions?: No Itching?:  No  Eyes Blurred vision?: No Double vision?: No  Ears/Nose/Throat Sore throat?: No Sinus problems?: No  Hematologic/Lymphatic Swollen glands?: Yes Easy bruising?: No  Cardiovascular Leg swelling?: No Chest pain?: No  Respiratory Cough?: No Shortness of breath?: No  Endocrine Excessive thirst?: No  Musculoskeletal Back pain?: Yes Joint pain?: No  Neurological Headaches?: No Dizziness?: No  Psychologic Depression?: No Anxiety?: No  Physical Exam: BP 123/85   Pulse 77   Ht 5\' 8"  (1.727 m)   Wt 265 lb 11.2 oz (120.5 kg)   LMP 05/31/2017   BMI 40.40 kg/m   Constitutional: Well nourished. Alert and oriented, No acute distress. HEENT: [place] AT, moist mucus membranes. Trachea midline, no masses. Cardiovascular: No clubbing, cyanosis,  or edema. Respiratory: Normal respiratory effort, no increased work of breathing. GI: Abdomen is soft, non tender, non distended, no abdominal masses.  GU: No CVA tenderness.  No bladder fullness or masses.   Skin: No rashes, bruises or suspicious lesions. Lymph: No cervical or inguinal adenopathy. Neurologic: Grossly intact, no focal deficits, moving all 4 extremities. Psychiatric: Normal mood and affect.  Laboratory Data: Lab Results  Component Value Date   WBC 8.0 06/23/2017   HGB 12.8 06/23/2017   HCT 37.3 06/23/2017   MCV 83 06/23/2017   PLT 395 (H) 06/23/2017    Lab Results  Component Value Date   CREATININE 0.67 06/23/2017    Lab Results  Component Value Date   HGBA1C 5.6 05/30/2017    Lab Results  Component Value Date   TSH 1.010 06/12/2017       Component Value Date/Time   CHOL 198 05/30/2017 0000   HDL 35 (L) 05/30/2017 0000   CHOLHDL 5.7 (H) 05/30/2017 0000   LDLCALC 92 05/30/2017 0000    Lab Results  Component Value Date   AST 24 06/23/2017   Lab Results  Component Value Date   ALT 21 06/23/2017    Urinalysis Negative.  See EPIC.    I have reviewed the labs.   Pertinent  Imaging: CLINICAL DATA:  31 year old female with history of right upper quadrant pain  EXAM: CT ABDOMEN AND PELVIS WITH CONTRAST  TECHNIQUE: Multidetector CT imaging of the abdomen and pelvis was performed using the standard protocol following bolus administration of intravenous contrast.  CONTRAST:  125 cc Isovue-300  COMPARISON:  CT 05/08/2014  FINDINGS: Lower chest: No acute abnormality.  Hepatobiliary: Cranial caudal span of the right liver measures at least 24 cm. Diffusely decreased attenuation/enhancement of liver parenchyma compatible with steatosis. Focal fatty sparing at the periphery of the right liver.  Cholecystectomy  Pancreas: Unremarkable. No pancreatic ductal dilatation or surrounding inflammatory changes.  Spleen: Normal in size without focal abnormality.  Adrenals/Urinary Tract: Adrenal glands are unremarkable. Kidneys are normal, without renal calculi, focal lesion, or hydronephrosis. Bladder is unremarkable.  Stomach/Bowel: Unremarkable appearance of the stomach. Unremarkable small bowel.  Moderate stool burden. No abnormally distended colon, with no associated inflammatory changes. Normal appendix.  Vascular/Lymphatic: No significant atherosclerotic changes. No dissection of the abdominal aorta. No aneurysm. No periaortic fluid.  Multiple small lymph nodes in the ileo colic nodal station extending from the cecum along the vascular bundle. None of these nodes measure greater than 6 mm.  Reproductive: Unremarkable appearance of the uterus and the adnexa.  Other: Fat containing umbilical hernia.  Musculoskeletal: No acute displaced fracture. Mild degenerative changes of the spine.  IMPRESSION: Nonspecific lymph nodes of the ileo colic nodal station, none of which measure larger than 6 mm. The appendix is normal. This pattern can be seen with mesenteric adenitis, though typically is associated with younger patient  population. If there is concern for other hematologic disorder, correlation with lab values may be useful.  Hepatomegaly and steatosis.   Electronically Signed   By: Gilmer Mor D.O.   On: 06/09/2017 15:38  I have independently reviewed the films.    Assessment & Plan:   1. RLQ  - explained to the patient that since no stone was seen on recent CT and her UA was negative, it is highly unlikely that her pain is due to a stone  - I will culture the urine for completeness   - encouraged to keep follow ups with her other providers  2. History of nephrolithiasis  - no stones seen on recent CT  - RTC for flank pain or gross hematuria    Return for pending urine culture.  These notes generated with voice recognition software. I apologize for typographical errors.  Michiel Cowboy, PA-C  [facility] [address] [place], [place] [address] [phone]

## 2017-07-05 NOTE — Patient Instructions (Signed)
Pelvic Pain, Female °Pelvic pain is pain in your lower belly (abdomen), below your belly button and between your hips. The pain may start suddenly (acute), keep coming back (recurring), or last a long time (chronic). Pelvic pain that lasts longer than six months is considered chronic. There are many causes of pelvic pain. Sometimes the cause of your pelvic pain is not known. °Follow these instructions at home: °· Take over-the-counter and prescription medicines only as told by your doctor. °· Rest as told by your doctor. °· Do not have sex it if hurts. °· Keep a journal of your pelvic pain. Write down: °? When the pain started. °? Where the pain is located. °? What seems to make the pain better or worse, such as food or your menstrual cycle. °? Any symptoms you have along with the pain. °· Keep all follow-up visits as told by your doctor. This is important. °Contact a doctor if: °· Medicine does not help your pain. °· Your pain comes back. °· You have new symptoms. °· You have unusual vaginal discharge or bleeding. °· You have a fever or chills. °· You are having a hard time pooping (constipation). °· You have blood in your pee (urine) or poop (stool). °· Your pee smells bad. °· You feel weak or lightheaded. °Get help right away if: °· You have sudden pain that is very bad. °· Your pain continues to get worse. °· You have very bad pain and also have any of the following symptoms: °? A fever. °? Feeling stick to your stomach (nausea). °? Throwing up (vomiting). °? Being very sweaty. °· You pass out (lose consciousness). °This information is not intended to replace advice given to you by your health care provider. Make sure you discuss any questions you have with your health care provider. °Document Released: 04/18/2008 Document Revised: 11/25/2015 Document Reviewed: 08/21/2015 °Elsevier Interactive Patient Education © 2018 Else[employer] Inc[employer] ° °

## 2017-07-05 NOTE — Progress Notes (Signed)
Gynecology Pelvic Pain Evaluation   Chief Complaint:  Chief Complaint  Patient presents with  . Advice Only    pt feels nauseous and dizzy     History of Present Illness:   Patient is a 31 y.o. [id number] who LMP was Patient's last menstrual period was 05/31/2017., presents today for a problem visit.  She complains of pain.   Her pain is localized to the RLQ and deep pelvis area, described as constant and burning, began several weeks ago and its severity is described as moderate. The pain radiates to the  Non-radiating. She has these associated symptoms which include diarrhea, nausea and vomiting. Patient has these modifiers which include lying down that make it better and unable to associate with any factor that make it worse.  Context includes: spontaneous.  She notes no menstrual cramping.  Previous evaluation: PCP, GI evals.  CT done showing mesenteric adenitis, no appendicitis. Prior Diagnosis: none. Previous Treatment: none.  PMHx: She  has a past medical history of Abdominal pain, generalized (11/11/2014); Altered bowel function (11/11/2014); Anxiety; Bleeding per rectum (11/11/2014); Calculus of kidney (12/14/2015); Complication of anesthesia; Fatty infiltration of liver (11/11/2014); PCOS (polycystic ovarian syndrome); and Upper airway cough syndrome (08/17/2015). Also,  has a past surgical history that includes Cholecystectomy; Laparoscopic cholecystectomy (06/2009); STENT PLACE LEFT URETER ([facility] HX) (Left, 11/2012); Colonoscopy (09/2014); and Cesarean section (N/A, 06/22/2016)., family history includes Bladder Cancer in her maternal grandfather; Breast cancer in her maternal grandmother; Diabetes in her maternal grandmother; Heart disease in her father and maternal grandfather; Hyperlipidemia in her father; Hypertension in her brother and mother; Kidney Stones in her mother.,  reports that she has never smoked. She has never used smokeless tobacco. She reports that she drinks alcohol. She  reports that she does not use drugs.  She has a current medication list which includes the following prescription(s): acetaminophen, cetirizine, vitamin d3, fluoxetine, gianvi, metformin, multiple vitamins-minerals, omeprazole, ondansetron, and prenatal vit-fe fumarate-fa. Also, is allergic to levofloxacin; reglan [metoclopramide]; bactrim [sulfamethoxazole-trimethoprim]; equal [aspartame]; levaquin [levofloxacin in d5w]; and sulfa antibiotics.  Review of Systems  Constitutional: Negative for chills, fever and malaise/fatigue.  HENT: Negative for congestion, sinus pain and sore throat.   Eyes: Negative for blurred vision and pain.  Respiratory: Negative for cough and wheezing.   Cardiovascular: Negative for chest pain and leg swelling.  Gastrointestinal: Negative for abdominal pain, constipation, diarrhea, heartburn, nausea and vomiting.  Genitourinary: Negative for dysuria, frequency, hematuria and urgency.  Musculoskeletal: Negative for back pain, joint pain, myalgias and neck pain.  Skin: Negative for itching and rash.  Neurological: Negative for dizziness, tremors and weakness.  Endo/Heme/Allergies: Does not bruise/bleed easily.  Psychiatric/Behavioral: Negative for depression. The patient is not nervous/anxious and does not have insomnia.     Objective: BP 120/80   Pulse 72   Ht 5\' 8"  (1.727 m)   Wt 261 lb (118.4 kg)   LMP 05/31/2017   BMI 39.68 kg/m  Physical Exam  Constitutional: She is oriented to person, place, and time. She appears well-developed and well-nourished. No distress.  Genitourinary: Rectum normal, vagina normal and uterus normal. Pelvic exam was performed with patient supine. There is no rash or lesion on the right labia. There is no rash or lesion on the left labia. Vagina exhibits no lesion. No bleeding in the vagina. Right adnexum does not display mass and does not display tenderness. Left adnexum does not display mass and does not display tenderness. Cervix does  not exhibit motion tenderness, lesion, friability  or polyp.   Uterus is mobile and midaxial. Uterus is not enlarged or exhibiting a mass.  HENT:  Head: Normocephalic and atraumatic. Head is without laceration.  Right Ear: Hearing normal.  Left Ear: Hearing normal.  Nose: No epistaxis.  No foreign bodies.  Mouth/Throat: Uvula is midline, oropharynx is clear and moist and mucous membranes are normal.  Eyes: Pupils are equal, round, and reactive to light.  Neck: Normal range of motion. Neck supple. No thyromegaly present.  Cardiovascular: Normal rate and regular rhythm.  Exam reveals no gallop and no friction rub.   No murmur heard. Pulmonary/Chest: Effort normal and breath sounds normal. No respiratory distress. She has no wheezes. Right breast exhibits no mass, no skin change and no tenderness. Left breast exhibits no mass, no skin change and no tenderness.  Abdominal: Soft. Bowel sounds are normal. She exhibits no distension. There is no tenderness. There is no rebound.  Musculoskeletal: Normal range of motion.  Neurological: She is alert and oriented to person, place, and time. No cranial nerve deficit.  Skin: Skin is warm and dry.  Psychiatric: She has a normal mood and affect. Judgment normal.  Vitals reviewed.   Female chaperone present for pelvic portion of the physical exam  Assessment: 31 y.o. G2P0111 with PID, Cyst, Mass, all to be investigated, although more likely this seems GI related.  She is still awaiting testing from GI for other etiologies.  Adhesive dz from prior CS also discussed..  1. Right lower quadrant pain - US Transvaginal Non-OB; Future - GC/Chlamydia Probe Amp  2. Non-intractable vomiting with nausea, unspecified vomiting type - US Transvaginal Non-OB; Future - GC/Chlamydia Probe Amp  Problem List Items Addressed This Visit    None    Visit Diagnoses    Right lower quadrant pain    -  Primary   Relevant Orders   US Transvaginal Non-OB   GC/Chlamydia  Probe Amp   Non-intractable vomiting with nausea, unspecified vomiting type       Relevant Orders   US Transvaginal Non-OB   GC/Chlamydia Probe Amp    Uncertain etiology and may be GI (results still pending there) Will check for PID, cyst, swelling, mass  Annamarie Major, MD, [facility], [facility] 07/05/2017  12:02 PM

## 2017-07-06 ENCOUNTER — Encounter: Payer: Self-pay | Admitting: Urology

## 2017-07-06 ENCOUNTER — Telehealth: Payer: Self-pay

## 2017-07-06 ENCOUNTER — Ambulatory Visit (INDEPENDENT_AMBULATORY_CARE_PROVIDER_SITE_OTHER): Payer: BLUE CROSS/BLUE SHIELD | Admitting: Urology

## 2017-07-06 VITALS — BP 123/85 | HR 77 | Ht 68.0 in | Wt 265.7 lb

## 2017-07-06 DIAGNOSIS — Z87442 Personal history of urinary calculi: Secondary | ICD-10-CM | POA: Diagnosis not present

## 2017-07-06 DIAGNOSIS — R1031 Right lower quadrant pain: Secondary | ICD-10-CM | POA: Diagnosis not present

## 2017-07-06 LAB — URINALYSIS, COMPLETE
Bilirubin, UA: NEGATIVE
Glucose, UA: NEGATIVE
Ketones, UA: NEGATIVE
LEUKOCYTES UA: NEGATIVE
Nitrite, UA: NEGATIVE
PH UA: 5.5 (ref 5.0–7.5)
PROTEIN UA: NEGATIVE
SPEC GRAV UA: 1.025 (ref 1.005–1.030)
UUROB: 0.2 mg/dL (ref 0.2–1.0)

## 2017-07-06 LAB — MICROSCOPIC EXAMINATION
BACTERIA UA: NONE SEEN
EPITHELIAL CELLS (NON RENAL): NONE SEEN /HPF (ref 0–10)
WBC, UA: NONE SEEN /hpf (ref 0–?)

## 2017-07-06 LAB — BLADDER SCAN AMB NON-IMAGING: SCAN RESULT: 0

## 2017-07-07 LAB — GC/CHLAMYDIA PROBE AMP
Chlamydia trachomatis, NAA: NEGATIVE
NEISSERIA GONORRHOEAE BY PCR: NEGATIVE

## 2017-07-11 ENCOUNTER — Telehealth: Payer: Self-pay

## 2017-07-11 LAB — CULTURE, URINE COMPREHENSIVE

## 2017-07-11 NOTE — Telephone Encounter (Signed)
LMOM- most recent labs are negative.  

## 2017-07-11 NOTE — Telephone Encounter (Signed)
-----   Message from Harle Battiest, PA-C sent at 07/11/2017 12:15 PM EDT ----- Please let Alcario Drought know that her urine culture was negative.

## 2017-07-12 ENCOUNTER — Ambulatory Visit (INDEPENDENT_AMBULATORY_CARE_PROVIDER_SITE_OTHER): Payer: BLUE CROSS/BLUE SHIELD

## 2017-07-12 ENCOUNTER — Ambulatory Visit (INDEPENDENT_AMBULATORY_CARE_PROVIDER_SITE_OTHER): Payer: BLUE CROSS/BLUE SHIELD | Admitting: Obstetrics & Gynecology

## 2017-07-12 ENCOUNTER — Encounter: Payer: Self-pay | Admitting: Obstetrics & Gynecology

## 2017-07-12 VITALS — BP 138/90 | HR 82 | Ht 68.0 in | Wt 268.0 lb

## 2017-07-12 DIAGNOSIS — R1031 Right lower quadrant pain: Secondary | ICD-10-CM | POA: Diagnosis not present

## 2017-07-12 DIAGNOSIS — G8929 Other chronic pain: Secondary | ICD-10-CM | POA: Diagnosis not present

## 2017-07-12 DIAGNOSIS — R1084 Generalized abdominal pain: Secondary | ICD-10-CM

## 2017-07-12 DIAGNOSIS — E282 Polycystic ovarian syndrome: Secondary | ICD-10-CM

## 2017-07-12 DIAGNOSIS — R112 Nausea with vomiting, unspecified: Secondary | ICD-10-CM | POA: Diagnosis not present

## 2017-07-12 NOTE — Progress Notes (Signed)
  HPI: Abdominal Pain Patient presents for evaluation of abdominal pain. The pain is described as cramping and stabbing, and is 7/10 in intensity. Pain is located in the RLQ area with radiation to the deep pelvis. Onset was yerars ago but worse this summer. Symptoms have been unchanged since. Aggravating factors: none. Alleviating factors: recumbency. Associated symptoms: GI sx's. The patient denies chills, dysuria and fever. Risk factors for pelvic/abdominal pain include PCOS, FH Endometriosis.   Ultrasound demonstrates no masses seen, no tubal dz, does have small follicles bilaterally These findings are Pelvis normal  PMHx: She  has a past medical history of Abdominal pain, generalized (11/11/2014); Altered bowel function (11/11/2014); Anxiety; Bleeding per rectum (11/11/2014); Calculus of kidney (12/14/2015); Complication of anesthesia; Fatty infiltration of liver (11/11/2014); PCOS (polycystic ovarian syndrome); and Upper airway cough syndrome (08/17/2015). Also,  has a past surgical history that includes Cholecystectomy; Laparoscopic cholecystectomy (06/2009); STENT PLACE LEFT URETER ([facility] HX) (Left, 11/2012); Colonoscopy (09/2014); Cesarean section (N/A, 06/22/2016); Dilation and curettage of uterus (2013); and Hysteroscopy (2015)., family history includes Bladder Cancer in her maternal grandfather; Breast cancer in her maternal grandmother; Diabetes in her maternal grandmother; Heart disease in her father and maternal grandfather; Hyperlipidemia in her father; Hypertension in her brother and mother; Kidney Stones in her mother.,  reports that she has never smoked. She has never used smokeless tobacco. She reports that she drinks alcohol. She reports that she does not use drugs.  She has a current medication list which includes the following prescription(s): acetaminophen, cetirizine, vitamin d3, fluoxetine, gianvi, krill oil, metformin, multiple vitamins-minerals, omeprazole, ondansetron, and prenatal  vit-fe fumarate-fa. Also, is allergic to levofloxacin; reglan [metoclopramide]; bactrim [sulfamethoxazole-trimethoprim]; equal [aspartame]; levaquin [levofloxacin in d5w]; and sulfa antibiotics.  Review of Systems  All other systems reviewed and are negative.  Objective: BP 138/90   Pulse 82   Ht 5\' 8"  (1.727 m)   Wt 268 lb (121.6 kg)   LMP 05/31/2017   BMI 40.75 kg/m   Physical examination Constitutional NAD, Conversant  Skin No rashes, lesions or ulceration.   Extremities: Moves all appropriately.  Normal ROM for age. No lymphadenopathy.  Neuro: Grossly intact  Psych: Oriented to PPT.  Normal mood. Normal affect.   Assessment:  Abdominal pain, chronic, generalized  PCOS (polycystic ovarian syndrome) Possible endometriosis as well based on CPP and FH.  Consider Dx Lap in future if no GI etiology and no improvement.  Tx options for endometriosis discussed.  Annamarie MajorPaul Trebor Galdamez, MD, [facility], [facility] 07/12/2017  9:26 AM

## 2017-07-27 NOTE — Telephone Encounter (Signed)
Discussed referral to GI. Questions answered.

## 2017-08-01 ENCOUNTER — Ambulatory Visit (INDEPENDENT_AMBULATORY_CARE_PROVIDER_SITE_OTHER): Payer: BLUE CROSS/BLUE SHIELD | Admitting: Obstetrics & Gynecology

## 2017-08-01 ENCOUNTER — Encounter: Payer: Self-pay | Admitting: Obstetrics & Gynecology

## 2017-08-01 VITALS — BP 130/90 | HR 78 | Ht 68.0 in | Wt 264.0 lb

## 2017-08-01 DIAGNOSIS — Z0000 Encounter for general adult medical examination without abnormal findings: Secondary | ICD-10-CM | POA: Diagnosis not present

## 2017-08-01 DIAGNOSIS — E282 Polycystic ovarian syndrome: Secondary | ICD-10-CM | POA: Diagnosis not present

## 2017-08-01 DIAGNOSIS — Z124 Encounter for screening for malignant neoplasm of cervix: Secondary | ICD-10-CM

## 2017-08-01 NOTE — Progress Notes (Signed)
HPI:      [name] is a 31 y.o. [phone] who LMP was Patient's last menstrual period was 07/30/2017., she presents today for her annual examination. The patient has no complaints today. The patient is sexually active. Her last pap: was normal. The patient does perform self breast exams.  There is no notable family history of breast or ovarian cancer in her family.  The patient has regular exercise: yes.  The patient denies current symptoms of depression.  Does report anxiety with child and overzealous in protecting childs environment, OCD like behavior  GYN History: Contraception: OCP (estrogen/progesterone)  PMHx: Past Medical History:  Diagnosis Date  . Abdominal pain, generalized 11/11/2014  . Altered bowel function 11/11/2014  . Anxiety    tkes Prozac, for OCD related anxiety  . Bleeding per rectum 11/11/2014  . Calculus of kidney 12/14/2015  . Complication of anesthesia   . Fatty infiltration of liver 11/11/2014  . PCOS (polycystic ovarian syndrome)   . Upper airway cough syndrome 08/17/2015   Followed in Pulmonary clinic/ [facility]/ [place]     Past Surgical History:  Procedure Laterality Date  . CESAREAN SECTION N/A 06/22/2016   Procedure: CESAREAN SECTION;  Surgeon: [name], MD;  Location: [facility] ORS;  Service: Obstetrics;  Laterality: N/A;  . CHOLECYSTECTOMY    . COLONOSCOPY  09/2014  . DILATION AND CURETTAGE OF UTERUS  2013  . HYSTEROSCOPY  2015  . LAPAROSCOPIC CHOLECYSTECTOMY  06/2009  . STENT PLACE LEFT URETER ([facility] HX) Left 11/2012   has had 2 stones pass stones   Family History  Problem Relation Age of Onset  . Heart disease Father   . Hyperlipidemia Father   . Breast cancer Maternal Grandmother   . Diabetes Maternal Grandmother   . Bladder Cancer Maternal Grandfather   . Heart disease Maternal Grandfather   . Kidney Stones Mother   . Hypertension Mother   . Hypertension Brother   . Kidney cancer Neg Hx    Social History  Substance Use  Topics  . Smoking status: Never Smoker  . Smokeless tobacco: Never Used  . Alcohol use 0.0 oz/week     Comment: rarely    Current Outpatient Prescriptions:  .  acetaminophen (TYLENOL) 500 MG tablet, Take 1,000 mg by mouth every 6 (six) hours as needed., Disp: , Rfl:  .  cetirizine (ZYRTEC) 10 MG tablet, Take 10 mg by mouth daily., Disp: , Rfl:  .  Cholecalciferol (VITAMIN D3) 2000 UNITS capsule, Take 4,000 Units by mouth daily. , Disp: , Rfl:  .  FLUoxetine (PROZAC) 10 MG tablet, Take 20 mg by mouth at bedtime. , Disp: , Rfl: 2 .  GIANVI 3-0.02 MG tablet, Take 1 tablet by mouth daily. , Disp: , Rfl: 2 .  KRILL OIL PO, Take by mouth., Disp: , Rfl:  .  metFORMIN (GLUCOPHAGE-XR) 500 MG 24 hr tablet, Take 500 mg by mouth daily., Disp: , Rfl:  .  Multiple Vitamins-Minerals (WOMENS MULTIVITAMIN PO), Take by mouth., Disp: , Rfl:  .  omeprazole (PRILOSEC) 20 MG capsule, Take 1 capsule (20 mg total) by mouth daily., Disp: 30 capsule, Rfl: 1 .  ondansetron (ZOFRAN-ODT) 4 MG disintegrating tablet, Take 1 tablet (4 mg total) by mouth every 8 (eight) hours as needed for nausea or vomiting (may cause drowsiness)., Disp: 10 tablet, Rfl: 0 .  Prenatal Vit-Fe Fumarate-FA (PRENATAL VITAMIN PO), Take 1 tablet by mouth daily., Disp: , Rfl:  Allergies: Levofloxacin; Reglan [metoclopramide]; Bactrim [  sulfamethoxazole-trimethoprim]; Equal [aspartame]; Levaquin [levofloxacin in d5w]; and Sulfa antibiotics  Review of Systems  Constitutional: Negative for chills, fever and malaise/fatigue.  HENT: Negative for congestion, sinus pain and sore throat.   Eyes: Negative for blurred vision and pain.  Respiratory: Negative for cough and wheezing.   Cardiovascular: Negative for chest pain and leg swelling.  Gastrointestinal: Negative for abdominal pain, constipation, diarrhea, heartburn, nausea and vomiting.  Genitourinary: Negative for dysuria, frequency, hematuria and urgency.  Musculoskeletal: Negative for back pain,  joint pain, myalgias and neck pain.  Skin: Negative for itching and rash.  Neurological: Negative for dizziness, tremors and weakness.  Endo/Heme/Allergies: Does not bruise/bleed easily.  Psychiatric/Behavioral: Negative for depression. The patient is not nervous/anxious and does not have insomnia.     Objective: BP 130/90   Pulse 78   Ht  (1.727 m)   Wt 264 lb (119.7 kg)   LMP 07/30/2017   BMI 40.14 kg/m   Filed Weights   08/01/17 1457  Weight: 264 lb (119.7 kg)   Body mass index is 40.14 kg/m. Physical Exam  Constitutional: She is oriented to person, place, and time. She appears well-developed and well-nourished. No distress.  Genitourinary: Rectum normal, vagina normal and uterus normal. Pelvic exam was performed with patient supine. There is no rash or lesion on the right labia. There is no rash or lesion on the left labia. Vagina exhibits no lesion. No bleeding in the vagina. Right adnexum does not display mass and does not display tenderness. Left adnexum does not display mass and does not display tenderness. Cervix does not exhibit motion tenderness, lesion, friability or polyp.   Uterus is mobile and midaxial. Uterus is not enlarged or exhibiting a mass.  HENT:  Head: Normocephalic and atraumatic. Head is without laceration.  Right Ear: Hearing normal.  Left Ear: Hearing normal.  Nose: No epistaxis.  No foreign bodies.  Mouth/Throat: Uvula is midline, oropharynx is clear and moist and mucous membranes are normal.  Eyes: Pupils are equal, round, and reactive to light.  Neck: Normal range of motion. Neck supple. No thyromegaly present.  Cardiovascular: Normal rate and regular rhythm.  Exam reveals no gallop and no friction rub.   No murmur heard. Pulmonary/Chest: Effort normal and breath sounds normal. No respiratory distress. She has no wheezes. Right breast exhibits no mass, no skin change and no tenderness. Left breast exhibits no mass, no skin change and no  tenderness.  Abdominal: Soft. Bowel sounds are normal. She exhibits no distension. There is no tenderness. There is no rebound.  Musculoskeletal: Normal range of motion.  Neurological: She is alert and oriented to person, place, and time. No cranial nerve deficit.  Skin: Skin is warm and dry.  Psychiatric: She has a normal mood and affect. Judgment normal.  Vitals reviewed.   Assessment:  ANNUAL EXAM 1. Annual physical exam   2. Screening for cervical cancer   3. PCOS (polycystic ovarian syndrome)      Screening Plan:            1.  Cervical Screening-  Pap smear done today  2. Breast screening- Exam annually and mammogram>40 planned   3. Colonoscopy every 10 years, Hemoccult testing - after age 60  4. Labs managed by PCP  5. Counseling for contraception: oral contraceptives (estrogen/progesterone)  Other:  1. Annual physical exam  2. Screening for cervical cancer - IGP, Aptima HPV  3. PCOS (polycystic ovarian syndrome) Cont OCP 3 mos   4. OCD/anxiety Counseling or  psychiatry advised.  Info provided.    F/U  Return in about 1 year (around 08/01/2018) for Annual.  Annamarie Major, MD, [facility], [facility] 08/01/2017  3:22 PM

## 2017-08-01 NOTE — Patient Instructions (Signed)
Therapists and Psychiatrists in the area This is not meant to be comprehensive list, but a resource for some providers that you can call for counseling or medication needs. If you have a recommendation, please let us know.  [facility] - [phone] [employer] - finds counselor/psychiatrist for you - [phone] Morton Stall - [phone] - BC[employer] only Dr Philis Fendt [facility] - [phone] Dr Imogene Burn - [phone] [facility] - [phone] *Conni Elliot, MD, - [address], [place] - [phone] Dr Carmelina Peal - [phone] [address], [employer] Dr Overton Mam - [facility] health. [phone] *Drs Janeece Riggers and Percell Belt - [place] -  [phone]  *[facility] - [place] - [phone] *Ollen Gross, PhD - [place] [phone]

## 2017-08-03 LAB — IGP, APTIMA HPV
HPV Aptima: NEGATIVE
PAP Smear Comment: 0

## 2017-08-15 DIAGNOSIS — K2950 Unspecified chronic gastritis without bleeding: Secondary | ICD-10-CM | POA: Diagnosis not present

## 2017-08-15 DIAGNOSIS — R110 Nausea: Secondary | ICD-10-CM | POA: Diagnosis not present

## 2017-08-15 DIAGNOSIS — K2990 Gastroduodenitis, unspecified, without bleeding: Secondary | ICD-10-CM | POA: Diagnosis not present

## 2017-08-15 DIAGNOSIS — R1011 Right upper quadrant pain: Secondary | ICD-10-CM | POA: Diagnosis not present

## 2017-08-15 DIAGNOSIS — K2960 Other gastritis without bleeding: Secondary | ICD-10-CM | POA: Diagnosis not present

## 2017-09-13 ENCOUNTER — Other Ambulatory Visit: Payer: Self-pay | Admitting: Obstetrics & Gynecology

## 2017-09-20 ENCOUNTER — Other Ambulatory Visit: Payer: Self-pay

## 2017-09-20 ENCOUNTER — Telehealth: Payer: Self-pay

## 2017-09-20 MED ORDER — DROSPIRENONE-ETHINYL ESTRADIOL 3-0.02 MG PO TABS
1.0000 | ORAL_TABLET | Freq: Every day | ORAL | 11 refills | Status: DC
Start: 2017-09-20 — End: 2017-09-20

## 2017-09-20 MED ORDER — DROSPIRENONE-ETHINYL ESTRADIOL 3-0.02 MG PO TABS: 1.0000 | ORAL_TABLET | Freq: Every day | ORAL | 4 refills | Status: DC

## 2017-09-20 MED ORDER — DROSPIRENONE-ETHINYL ESTRADIOL 3-0.02 MG PO TABS: 1.0000 | ORAL_TABLET | Freq: Every day | ORAL | 11 refills | Status: DC

## 2017-09-20 NOTE — Telephone Encounter (Signed)
Refill sent.

## 2017-09-20 NOTE — Telephone Encounter (Signed)
Pt states her pharmacy sent in a rf request for her YAZ. It was approved for 1 mo only. She usually gets 3 mos @ a time & just had her AE 07/2017. Pt requesting 90 supply w/1 yr refills be sent to pharmacy.

## 2017-10-16 ENCOUNTER — Encounter: Payer: Self-pay | Admitting: Medical

## 2017-10-16 ENCOUNTER — Ambulatory Visit: Payer: BLUE CROSS/BLUE SHIELD | Admitting: Medical

## 2017-10-16 VITALS — BP 132/84 | HR 91 | Temp 99.1°F | Resp 18 | Ht 68.0 in | Wt 261.8 lb

## 2017-10-16 DIAGNOSIS — L089 Local infection of the skin and subcutaneous tissue, unspecified: Secondary | ICD-10-CM

## 2017-10-16 DIAGNOSIS — R591 Generalized enlarged lymph nodes: Secondary | ICD-10-CM

## 2017-10-16 MED ORDER — CEPHALEXIN 500 MG PO CAPS: 500.0000 mg | ORAL_CAPSULE | Freq: Three times a day (TID) | ORAL | 0 refills | Status: DC

## 2017-10-16 NOTE — Patient Instructions (Signed)
Lymphadenopathy Lymphadenopathy refers to swollen or enlarged lymph glands, also called lymph nodes. Lymph glands are part of your body's defense (immune) system, which protects the body from infections, germs, and diseases. Lymph glands are found in many locations in your body, including the neck, underarm, and groin. Many things can cause lymph glands to become enlarged. When your immune system responds to germs, such as viruses or bacteria, infection-fighting cells and fluid build up. This causes the glands to grow in size. Usually, this is not something to worry about. The swelling and any soreness often go away without treatment. However, swollen lymph glands can also be caused by a number of diseases. Your health care provider may do various tests to help determine the cause. If the cause of your swollen lymph glands cannot be found, it is important to monitor your condition to make sure the swelling goes away. Follow these instructions at home: Watch your condition for any changes. The following actions may help to lessen any discomfort you are feeling:  Get plenty of rest.  Take medicines only as directed by your health care provider. Your health care provider may recommend over-the-counter medicines for pain.  Apply moist heat compresses to the site of swollen lymph nodes as directed by your health care provider. This can help reduce any pain.  Check your lymph nodes daily for any changes.  Keep all follow-up visits as directed by your health care provider. This is important.  Contact a health care provider if:  Your lymph nodes are still swollen after 2 weeks.  Your swelling increases or spreads to other areas.  Your lymph nodes are hard, seem fixed to the skin, or are growing rapidly.  Your skin over the lymph nodes is red and inflamed.  You have a fever.  You have chills.  You have fatigue.  You develop a sore throat.  You have abdominal pain.  You have weight  loss.  You have night sweats. Get help right away if:  You notice fluid leaking from the area of the enlarged lymph node.  You have severe pain in any area of your body.  You have chest pain.  You have shortness of breath. This information is not intended to replace advice given to you by your health care provider. Make sure you discuss any questions you have with your health care provider. Document Released: 08/09/2008 Document Revised: 04/07/2016 Document Reviewed: 06/05/2014 Elsevier Interactive Patient Education  2018 Elsevier Inc.  

## 2017-10-16 NOTE — Progress Notes (Signed)
   Subjective:    Patient ID: Melissa Martinez, female    DOB: 14-Jun-1986, 31 y.o.   MRN: [id number]  HPI  Saturday with right occiptal  And facial lymph node swelling. Low grade fever. Temp 99,1 in clinic.   Review of Systems  Constitutional: Positive for fever (99.5). Negative for chills.  HENT: Positive for ear pain (outside left after changing earring.) and sore throat (little scractchy). Negative for congestion.   Eyes: Negative for discharge and itching.  Respiratory: Negative for cough and shortness of breath.   Cardiovascular: Negative for chest pain, palpitations and leg swelling.  Gastrointestinal: Negative for abdominal pain.  Endocrine: Negative for polydipsia, polyphagia and polyuria.  Genitourinary: Negative for dysuria.  Musculoskeletal: Negative for myalgias.  Skin: Negative for rash.  Allergic/Immunologic: Positive for food allergies. Negative for environmental allergies.  Hematological: Positive for adenopathy (posterior occipital and right parotid).  Psychiatric/Behavioral: Negative for behavioral problems, self-injury and suicidal ideas.       Objective:   Physical Exam  Constitutional: She is oriented to person, place, and time. She appears well-developed and well-nourished.  HENT:  Head: Normocephalic and atraumatic.  Right Ear: Hearing, external ear and ear canal normal. A middle ear effusion is present.  Left Ear: Hearing normal. A middle ear effusion is present.  Nose: Rhinorrhea present.  Mouth/Throat: Uvula is midline, oropharynx is clear and moist and mucous membranes are normal.  Eyes: Conjunctivae and EOM are normal. Pupils are equal, round, and reactive to light.  Neck: Normal range of motion. Neck supple.  Cardiovascular: Normal rate, regular rhythm and normal heart sounds.  Pulmonary/Chest: Effort normal and breath sounds normal.  Musculoskeletal: Normal range of motion.  Lymphadenopathy:    She has no cervical adenopathy.  Neurological: She is  alert and oriented to person, place, and time.  Skin: Skin is warm and dry.  Psychiatric: She has a normal mood and affect. Her behavior is normal. Judgment and thought content normal.  Nursing note and vitals reviewed.     Left tragus red and warm and mild swelling with small amount of redness into the canal opening.    Assessment & Plan:  Skin infection left ear  Tragus Lymphadenopathy posterior occiptal Tylenol and Flonase take as directed. Warm compresses. Meds ordered this encounter  Medications  . cephALEXin (KEFLEX) 500 MG capsule    Sig: Take 1 capsule (500 mg total) by mouth 3 (three) times daily.    Dispense:  21 capsule    Refill:  0  return in 3-5 days if not improving, Headache, stiff neck go to the Emergency room. Patient verbalizes understanding and has no questions at discharge.

## 2017-10-17 ENCOUNTER — Ambulatory Visit: Payer: BLUE CROSS/BLUE SHIELD | Admitting: Medical

## 2017-10-24 ENCOUNTER — Other Ambulatory Visit: Payer: Self-pay | Admitting: Obstetrics & Gynecology

## 2017-10-26 ENCOUNTER — Ambulatory Visit: Payer: BLUE CROSS/BLUE SHIELD | Admitting: Adult Health

## 2017-10-26 ENCOUNTER — Encounter: Payer: Self-pay | Admitting: Adult Health

## 2017-10-26 VITALS — BP 120/80 | HR 90 | Temp 98.9°F | Resp 16 | Ht 68.0 in | Wt 266.0 lb

## 2017-10-26 DIAGNOSIS — J039 Acute tonsillitis, unspecified: Secondary | ICD-10-CM

## 2017-10-26 DIAGNOSIS — R591 Generalized enlarged lymph nodes: Secondary | ICD-10-CM

## 2017-10-26 LAB — POCT URINE PREGNANCY: PREG TEST UR: NEGATIVE

## 2017-10-26 MED ORDER — NEOMYCIN-POLYMYXIN-HC 3.5-10000-1 OT SOLN: 3.0000 [drp] | Freq: Four times a day (QID) | OTIC | 0 refills | Status: DC

## 2017-10-26 MED ORDER — AMOXICILLIN-POT CLAVULANATE 875-125 MG PO TABS: 1.0000 | ORAL_TABLET | Freq: Two times a day (BID) | ORAL | 0 refills | Status: DC

## 2017-10-26 NOTE — Patient Instructions (Signed)
Tonsillitis Tonsillitis is an infection of the throat that causes the tonsils to become red, tender, and swollen. Tonsils are collections of lymphoid tissue at the back of the throat. Each tonsil has crevices (crypts). Tonsils help fight nose and throat infections and keep infection from spreading to other parts of the body for the first 18 months of life. What are the causes? Sudden (acute) tonsillitis is usually caused by infection with streptococcal bacteria. Long-lasting (chronic) tonsillitis occurs when the crypts of the tonsils become filled with pieces of food and bacteria, which makes it easy for the tonsils to become repeatedly infected. What are the signs or symptoms? Symptoms of tonsillitis include:  A sore throat, with possible difficulty swallowing.  White patches on the tonsils.  Fever.  Tiredness.  New episodes of snoring during sleep, when you did not snore before.  Small, foul-smelling, yellowish-white pieces of material (tonsilloliths) that you occasionally cough up or spit out. The tonsilloliths can also cause you to have bad breath.  How is this diagnosed? Tonsillitis can be diagnosed through a physical exam. Diagnosis can be confirmed with the results of lab tests, including a throat culture. How is this treated? The goals of tonsillitis treatment include the reduction of the severity and duration of symptoms and prevention of associated conditions. Symptoms of tonsillitis can be improved with the use of steroids to reduce the swelling. Tonsillitis caused by bacteria can be treated with antibiotic medicines. Usually, treatment with antibiotic medicines is started before the cause of the tonsillitis is known. However, if it is determined that the cause is not bacterial, antibiotic medicines will not treat the tonsillitis. If attacks of tonsillitis are severe and frequent, your health care provider may recommend surgery to remove the tonsils (tonsillectomy). Follow these  instructions at home:  Rest as much as possible and get plenty of sleep.  Drink plenty of fluids. While the throat is very sore, eat soft foods or liquids, such as sherbet, soups, or instant breakfast drinks.  Eat frozen ice pops.  Gargle with a warm or cold liquid to help soothe the throat. Mix 1/4 teaspoon of salt and 1/4 teaspoon of baking soda in 8 oz of water. Contact a health care provider if:  Large, tender lumps develop in your neck.  A rash develops.  A green, yellow-brown, or bloody substance is coughed up.  You are unable to swallow liquids or food for 24 hours.  You notice that only one of the tonsils is swollen. Get help right away if:  You develop any new symptoms such as vomiting, severe headache, stiff neck, chest pain, or trouble breathing or swallowing.  You have severe throat pain along with drooling or voice changes.  You have severe pain, unrelieved with recommended medications.  You are unable to fully open the mouth.  You develop redness, swelling, or severe pain anywhere in the neck.  You have a fever. This information is not intended to replace advice given to you by your health care provider. Make sure you discuss any questions you have with your health care provider. Document Released: 08/10/2005 Document Revised: 04/07/2016 Document Reviewed: 04/19/2013 Elsevier Interactive Patient Education  2017 Elsevier Inc.  

## 2017-10-26 NOTE — Progress Notes (Addendum)
Subjective:     Patient ID: Melissa Martinez, female   DOB: [birth year], 31 y.o.   MRN: [id number]  HPI Patient is a 31 year old female in no acute distress who presents to the clinic for follow up 10/16/17 she has finished her Keflex. She reports left ear skin is all improved.  She reports her right ear is now still hurting since previous visit and reports small swollen tender node in front of  her ear and on right side of throat.  She also reports sore throat. She reports the node that was swollen behind her right ear has resolved with antibiotic.   Mild low grade temperature at home.   Patient  denies any fever, chills, rash, chest pain, shortness of breath, nausea, vomiting, or diarrhea.( nothing out of the ordinary for her usual GI symptoms)  she is seeing gastroenterologist.  She denies any other recent infections. No ill contacts at home.   She denies any recurrent infections. Her daughter was sick around a month ago.    Blood pressure 120/80, pulse 90, temperature 98.9 F (37.2 C), resp. rate 16, height 5\' 8"  (1.727 m), weight 266 lb (120.7 kg), last menstrual period 08/13/2017, SpO2 99 %, unknown if currently breastfeeding.      Review of Systems  Constitutional: Positive for fever (mild at home ). Negative for activity change, appetite change, chills, diaphoresis, fatigue and unexpected weight change.  HENT: Positive for ear pain (right / left resolved ), postnasal drip and sore throat (right side mostly ).   Respiratory: Negative.   Cardiovascular: Negative.   Genitourinary: Negative.        Seeing GI for gastritis  See previous notes also has PCP that follows and denies any new complaints.   Musculoskeletal: Negative.   Skin: Negative.   Allergic/Immunologic: Positive for food allergies.  Hematological: Negative.   Psychiatric/Behavioral: Negative.        Objective:   Physical Exam  Constitutional: She is oriented to person, place, and time. She appears well-developed  and well-nourished. No distress.  HENT:  Head: Atraumatic.  Right Ear: Tympanic membrane is not perforated. A middle ear effusion is present.  Left Ear: External ear normal. Tympanic membrane is not perforated. A middle ear effusion is present.  Nose: Nose normal. Right sinus exhibits no maxillary sinus tenderness and no frontal sinus tenderness. Left sinus exhibits no maxillary sinus tenderness and no frontal sinus tenderness.  Mouth/Throat: Uvula is midline and mucous membranes are normal. Mucous membranes are not pale, not dry and not cyanotic. No oral lesions. No uvula swelling. Posterior oropharyngeal erythema present. No oropharyngeal exudate, posterior oropharyngeal edema or tonsillar abscesses.    Bilateral ear canals mildly pink may be due to reported cotton swab use  Right tonsil 2 +  Left tonsil 1 +   Eyes: Conjunctivae and EOM are normal. Pupils are equal, round, and reactive to light. Right eye exhibits no discharge. Left eye exhibits no discharge. No scleral icterus.  Neck: Normal range of motion. Neck supple.  Cardiovascular: Normal rate, regular rhythm, normal heart sounds and intact distal pulses. Exam reveals no gallop and no friction rub.  No murmur heard. Pulmonary/Chest: Effort normal and breath sounds normal. No respiratory distress. She has no wheezes. She has no rales. She exhibits no tenderness.  Abdominal: Soft.  Musculoskeletal: Normal range of motion. She exhibits no edema, tenderness or deformity.  Patient moves on and off of exam table and in room without difficulty. Gait is normal in hall  and in room. Patient is oriented to person place time and situation. Patient answers questions appropriately and engages in conversation.   Lymphadenopathy:       Head (right side): Tonsillar (mild ) adenopathy present.       Head (left side): No tonsillar adenopathy present.    She has no cervical adenopathy.  Right parotid mildly enlarged.    Neurological: She is alert  and oriented to person, place, and time.  Skin: Skin is warm and dry. No rash noted. She is not diaphoretic. No erythema. No pallor.  Psychiatric: She has a normal mood and affect. Her behavior is normal. Judgment and thought content normal.  Nursing note and vitals reviewed.      Assessment:     Lymphadenopathy - Plan: CBC w/Diff, POCT urine pregnancy  Tonsillitis      Plan:     Orders Placed This Encounter  Procedures  . CBC w/Diff  . POCT urine pregnancy   Meds ordered this encounter  Medications  . amoxicillin-clavulanate (AUGMENTIN) 875-125 MG tablet    Sig: Take 1 tablet by mouth 2 (two) times daily.    Dispense:  20 tablet    Refill:  0  . neomycin-polymyxin-hydrocortisone (CORTISPORIN) OTIC solution    Sig: Place 3 drops into both ears 4 (four) times daily.    Dispense:  10 mL    Refill:  0   Provider recommended referral to  ENT  - patient prefers this treatment and if not improved she understands she needs ENT evaluation and will return to this clinic for  Advised to return to clinic for an appointment if no improvement within 72 hours or if any symptoms change or worsen. Advised ER or urgent Care if after hours or on weekend. 911 for emergency symptoms at any time.   Follow up with this office in one week for recheck. ( sooner if needed as above)   Patient verbalized understanding of all instructions given and denies any further questions at this time.

## 2017-10-27 ENCOUNTER — Telehealth: Payer: Self-pay | Admitting: Adult Health

## 2017-10-27 LAB — CBC WITH DIFFERENTIAL/PLATELET
BASOS: 1 %
Basophils Absolute: 0 10*3/uL (ref 0.0–0.2)
EOS (ABSOLUTE): 0.2 10*3/uL (ref 0.0–0.4)
EOS: 2 %
HEMATOCRIT: 36.8 % (ref 34.0–46.6)
HEMOGLOBIN: 12.6 g/dL (ref 11.1–15.9)
IMMATURE GRANULOCYTES: 1 %
Immature Grans (Abs): 0 10*3/uL (ref 0.0–0.1)
LYMPHS ABS: 3.2 10*3/uL — AB (ref 0.7–3.1)
Lymphs: 36 %
MCH: 28 pg (ref 26.6–33.0)
MCHC: 34.2 g/dL (ref 31.5–35.7)
MCV: 82 fL (ref 79–97)
MONOCYTES: 7 %
MONOS ABS: 0.6 10*3/uL (ref 0.1–0.9)
Neutrophils Absolute: 4.8 10*3/uL (ref 1.4–7.0)
Neutrophils: 53 %
Platelets: 375 10*3/uL (ref 150–379)
RBC: 4.5 x10E6/uL (ref 3.77–5.28)
RDW: 13.7 % (ref 12.3–15.4)
WBC: 8.9 10*3/uL (ref 3.4–10.8)

## 2017-10-27 NOTE — Telephone Encounter (Signed)
Left message to return call to office for lab results.

## 2017-10-27 NOTE — Progress Notes (Signed)
Patient has trivial elevation of absolute neutrophils likely resolving, will keep with current plan and follow up as instructed at office visit 10/26/17.  Left message on 10/27/17 for patient to return call to office for lab results.

## 2017-10-31 ENCOUNTER — Ambulatory Visit
Admission: RE | Admit: 2017-10-31 | Discharge: 2017-10-31 | Disposition: A | Discharge status: Home / Self Care | Payer: BLUE CROSS/BLUE SHIELD | Source: Ambulatory Visit | Attending: Urology | Admitting: Urology

## 2017-10-31 ENCOUNTER — Other Ambulatory Visit: Payer: Self-pay

## 2017-10-31 ENCOUNTER — Encounter: Payer: Self-pay | Admitting: Urology

## 2017-10-31 ENCOUNTER — Ambulatory Visit: Payer: BLUE CROSS/BLUE SHIELD | Admitting: Urology

## 2017-10-31 VITALS — BP 137/86 | HR 90 | Ht 68.0 in | Wt 264.8 lb

## 2017-10-31 DIAGNOSIS — R109 Unspecified abdominal pain: Secondary | ICD-10-CM | POA: Insufficient documentation

## 2017-10-31 DIAGNOSIS — N200 Calculus of kidney: Secondary | ICD-10-CM | POA: Diagnosis present

## 2017-10-31 DIAGNOSIS — R3915 Urgency of urination: Secondary | ICD-10-CM | POA: Diagnosis not present

## 2017-10-31 DIAGNOSIS — R1031 Right lower quadrant pain: Secondary | ICD-10-CM | POA: Diagnosis not present

## 2017-10-31 LAB — URINALYSIS, COMPLETE
BILIRUBIN UA: NEGATIVE
Glucose, UA: NEGATIVE
KETONES UA: NEGATIVE
LEUKOCYTES UA: NEGATIVE
NITRITE UA: NEGATIVE
Protein, UA: NEGATIVE
RBC UA: NEGATIVE
SPEC GRAV UA: 1.02 (ref 1.005–1.030)
Urobilinogen, Ur: 0.2 mg/dL (ref 0.2–1.0)
pH, UA: 5.5 (ref 5.0–7.5)

## 2017-10-31 LAB — MICROSCOPIC EXAMINATION
EPITHELIAL CELLS (NON RENAL): NONE SEEN /HPF (ref 0–10)
RBC MICROSCOPIC, UA: NONE SEEN /HPF (ref 0–?)

## 2017-10-31 MED ORDER — TAMSULOSIN HCL 0.4 MG PO CAPS: 0.4000 mg | ORAL_CAPSULE | Freq: Every day | ORAL | 0 refills | Status: DC

## 2017-10-31 MED ORDER — TRAMADOL HCL 50 MG PO TABS: 50.0000 mg | ORAL_TABLET | Freq: Four times a day (QID) | ORAL | 0 refills | Status: DC | PRN

## 2017-10-31 NOTE — Progress Notes (Signed)
kub

## 2017-10-31 NOTE — Progress Notes (Signed)
10/31/2017 1:34 PM   Melissa Martinez 01-26-1986 [id number]  Referring provider: Nadara MustardHarris, Robert P, MD [address] [place], [place] [id number]  Chief Complaint  Patient presents with  . Nephrolithiasis    HPI: 31 year old female presents for evaluation of possible kidney stone.  She presents with a 2-day history of urinary urgency and mild flank/right lower quadrant abdominal pain.  She denies fever, chills, nausea or vomiting.  She has intermittent bladder cramping sensation.  She has had a prior history of stones and her symptoms are similar though not as severe.  She was seen here back in late July for similar symptoms and had a CT of the abdomen pelvis which showed no renal or ureteral calculi.   PMH: Past Medical History:  Diagnosis Date  . Abdominal pain, generalized 11/11/2014  . Altered bowel function 11/11/2014  . Anxiety    tkes Prozac, for OCD related anxiety  . Bleeding per rectum 11/11/2014  . Calculus of kidney 12/14/2015  . Complication of anesthesia   . Fatty infiltration of liver 11/11/2014  . PCOS (polycystic ovarian syndrome)   . Upper airway cough syndrome 08/17/2015   Followed in Pulmonary clinic/ [facility]/ [place]      Surgical History: Past Surgical History:  Procedure Laterality Date  . CESAREAN SECTION N/A 06/22/2016   Procedure: CESAREAN SECTION;  Surgeon: Nadara Mustardobert P Harris, MD;  Location: [facility] ORS;  Service: Obstetrics;  Laterality: N/A;  . CHOLECYSTECTOMY    . COLONOSCOPY  09/2014  . DILATION AND CURETTAGE OF UTERUS  2013  . HYSTEROSCOPY  2015  . LAPAROSCOPIC CHOLECYSTECTOMY  06/2009  . STENT PLACE LEFT URETER ([facility] HX) Left 11/2012   has had 2 stones pass stones    Home Medications:  Allergies as of 10/31/2017      Reactions   Levofloxacin Itching, Other (See Comments)   Throat tightening, generalized itching, redness   Reglan [metoclopramide] Anxiety   Causes paranoia    Other    splenda- itching, hives   Bactrim  [sulfamethoxazole-trimethoprim] Itching   Also redness   Equal [aspartame] Itching   Levaquin [levofloxacin In D5w] Other (See Comments)   Throat tightening, generalized itching, redness   Sulfa Antibiotics       Medication List        Accurate as of 10/31/17  1:34 PM. Always use your most recent med list.          acetaminophen 500 MG tablet Commonly known as:  TYLENOL Take 1,000 mg by mouth every 6 (six) hours as needed.   amoxicillin-clavulanate 875-125 MG tablet Commonly known as:  AUGMENTIN Take 1 tablet by mouth 2 (two) times daily.   cetirizine 10 MG tablet Commonly known as:  ZYRTEC Take 10 mg by mouth daily.   drospirenone-ethinyl estradiol 3-0.02 MG tablet Commonly known as:  YAZ,GIANVI,LORYNA Take 1 tablet daily by mouth.   FLUoxetine 10 MG tablet Commonly known as:  PROZAC Take 30 mg by mouth at bedtime.   KRILL OIL PO Take by mouth.   neomycin-polymyxin-hydrocortisone OTIC solution Commonly known as:  CORTISPORIN Place 3 drops into both ears 4 (four) times daily.   omeprazole 20 MG capsule Commonly known as:  PRILOSEC Take 1 capsule (20 mg total) by mouth daily.   Vitamin D3 2000 units capsule Take 4,000 Units by mouth daily.   WOMENS MULTIVITAMIN PO Take by mouth.       Allergies:  Allergies  Allergen Reactions  . Levofloxacin Itching and Other (See Comments)  Throat tightening, generalized itching, redness  . Reglan [Metoclopramide] Anxiety    Causes paranoia   . Other     splenda- itching, hives  . Bactrim [Sulfamethoxazole-Trimethoprim] Itching    Also redness  . Equal [Aspartame] Itching  . Levaquin [Levofloxacin In D5w] Other (See Comments)    Throat tightening, generalized itching, redness  . Sulfa Antibiotics     Family History: Family History  Problem Relation Age of Onset  . Heart disease Father   . Hyperlipidemia Father   . Breast cancer Maternal Grandmother   . Diabetes Maternal Grandmother   . Bladder Cancer  Maternal Grandfather   . Heart disease Maternal Grandfather   . Kidney Stones Mother   . Hypertension Mother   . Hypertension Brother   . Kidney cancer Neg Hx     Social History:  reports that  has never smoked. she has never used smokeless tobacco. She reports that she drinks alcohol. She reports that she does not use drugs.  ROS: UROLOGY Frequent Urination?: Yes Hard to postpone urination?: No Burning/pain with urination?: Yes Get up at night to urinate?: Yes Leakage of urine?: No Urine stream starts and stops?: No Trouble starting stream?: No Do you have to strain to urinate?: No Blood in urine?: No Urinary tract infection?: No Sexually transmitted disease?: No Injury to kidneys or bladder?: No Painful intercourse?: No Weak stream?: No Currently pregnant?: No Vaginal bleeding?: No Last menstrual period?: n  Gastrointestinal Nausea?: Yes Vomiting?: No Indigestion/heartburn?: No Diarrhea?: No Constipation?: No  Constitutional Fever: No Night sweats?: Yes Weight loss?: No Fatigue?: Yes  Skin Skin rash/lesions?: No Itching?: No  Eyes Blurred vision?: No Double vision?: No  Ears/Nose/Throat Sore throat?: No Sinus problems?: No  Hematologic/Lymphatic Swollen glands?: Yes Easy bruising?: No  Cardiovascular Leg swelling?: No Chest pain?: No  Respiratory Cough?: No Shortness of breath?: No  Endocrine Excessive thirst?: No  Musculoskeletal Back pain?: No Joint pain?: No  Neurological Headaches?: No Dizziness?: No  Psychologic Depression?: No Anxiety?: No  Physical Exam: BP 137/86 (BP Location: Right Arm, Patient Position: Sitting, Cuff Size: Normal)   Pulse 90   Ht 5\' 8"  (1.727 m)   Wt 264 lb 12.8 oz (120.1 kg)   BMI 40.26 kg/m   Constitutional:  Alert and oriented, No acute distress. HEENT: [place] AT, moist mucus membranes.  Trachea midline, no masses. Cardiovascular: No clubbing, cyanosis, or edema. Respiratory: Normal respiratory  effort, no increased work of breathing. GI: Abdomen is soft, nontender, nondistended, no abdominal masses GU: No CVA tenderness. Skin: No rashes, bruises or suspicious lesions. Lymph: No cervical or inguinal adenopathy. Neurologic: Grossly intact, no focal deficits, moving all 4 extremities. Psychiatric: Normal mood and affect.  Laboratory Data: Lab Results  Component Value Date   WBC 8.9 10/26/2017   HGB 12.6 10/26/2017   HCT 36.8 10/26/2017   MCV 82 10/26/2017   PLT 375 10/26/2017    Lab Results  Component Value Date   CREATININE 0.67 06/23/2017     Lab Results  Component Value Date   HGBA1C 5.6 05/30/2017    Urinalysis Dipstick/microscopy negative  Pertinent Imaging: KUB reviewed-no obvious urinary calculi identified 31 year old female with a history of stone disease  Assessment & Plan:  31 year old female with a history of stone disease and recent onset of urinary urgency, suprapubic cramping and mild right flank pain.  No obvious stone on KUB.  Urinalysis today is clear.  CT scan performed in July showed no urinary calculi.  It is possible she  may have a small distal ureteral calculus Have recommended conservative treatment with tamsulosin.  She was also given a limited Rx tramadol to take as needed for pain.  If she has symptoms persisting beyond 1 week she was instructed to call back and will order a follow-up CT.  She was also instructed to call if her symptoms worsen.   1. Flank pain  - Urinalysis, Complete  2. Urgency of urination     Riki Altes, MD  [facility] [address], [address] [place], [place] [address] [phone]

## 2017-11-04 ENCOUNTER — Other Ambulatory Visit: Payer: Self-pay | Admitting: Obstetrics & Gynecology

## 2017-11-06 NOTE — Telephone Encounter (Signed)
Please advise 

## 2017-11-08 NOTE — Telephone Encounter (Signed)
Declined, not for me to refill (PCP or psychiatrist)

## 2017-11-11 ENCOUNTER — Other Ambulatory Visit: Payer: Self-pay | Admitting: Obstetrics & Gynecology

## 2017-11-13 ENCOUNTER — Telehealth: Payer: Self-pay

## 2017-11-13 MED ORDER — FLUOXETINE HCL 10 MG PO TABS: 30.0000 mg | ORAL_TABLET | Freq: Every day | ORAL | 1 refills | Status: DC

## 2017-11-13 NOTE — Telephone Encounter (Signed)
ERx done

## 2017-11-13 NOTE — Telephone Encounter (Signed)
Pt is requesting refill for fluoxetine 30mg  (10 mg tabs). Per pt [facility] denied request stating she needs to get from her primary. Pt states that previous primary was Dr. Dear and is requesting one month of refills until she can establish with a new PCP and if she can have a referral for a PCP. Cb# [phone] pt does not want to stop medication abruptly. Thank you.

## 2017-11-15 NOTE — Telephone Encounter (Signed)
Pt is needing rx to be updated for 90 tablets for one month supply due to takes 30 mg daily. Pt takes 3 10mg  tabs. Please resend rx. Thank you.

## 2017-11-16 ENCOUNTER — Other Ambulatory Visit: Payer: Self-pay | Admitting: Obstetrics & Gynecology

## 2017-11-16 MED ORDER — FLUOXETINE HCL 10 MG PO TABS: 30.0000 mg | ORAL_TABLET | Freq: Every day | ORAL | 1 refills | Status: DC

## 2017-11-16 NOTE — Telephone Encounter (Signed)
Pt aware and plans to contact insurance company for in [employer] [facility].

## 2017-11-20 ENCOUNTER — Ambulatory Visit: Payer: BLUE CROSS/BLUE SHIELD | Admitting: Medical

## 2017-11-20 ENCOUNTER — Encounter: Payer: Self-pay | Admitting: Medical

## 2017-11-20 VITALS — BP 130/80 | HR 89 | Temp 98.3°F | Resp 18 | Wt 268.0 lb

## 2017-11-20 DIAGNOSIS — R591 Generalized enlarged lymph nodes: Secondary | ICD-10-CM

## 2017-11-20 DIAGNOSIS — B349 Viral infection, unspecified: Secondary | ICD-10-CM

## 2017-11-20 DIAGNOSIS — H6983 Other specified disorders of Eustachian tube, bilateral: Secondary | ICD-10-CM

## 2017-11-20 NOTE — Progress Notes (Signed)
   Subjective:    Patient ID: Melissa Martinez, female    DOB: [birth year], 32 y.o.   MRN: [id number]  HPI  32 yo female in non acute distress comes in today to have ears checked.Still feels pressure in ear bilaterally and lymph node swelling under jaw. Diagnoised with eustachian tube dysfunction and bilateral tonsillar swelling (Tonsillitis) on 10/26/18 .  Placed on Augmentin. Denies any fevers.  Did not use prescription ear drops for both ears. Wanted referral to ENT due to tonsil swelling , right sided lymphadenpathy on Jan 2nd and increased on Jan 3rd. Now has improved but still is present.  Finished Augmentin on December 23rd.  Thinks she passed the kidney stone and then had her period.Feels that problems has resolved.  Review of Systems  Constitutional: Negative for chills, fatigue and fever.  HENT: Positive for postnasal drip and sore throat. Negative for congestion, rhinorrhea, sinus pressure and sinus pain.   Eyes: Negative for discharge and itching.  Respiratory: Positive for cough (mild due to PND). Negative for shortness of breath.   Cardiovascular: Negative for chest pain.  Gastrointestinal: Negative for abdominal pain.  Endocrine: Negative for polydipsia (thirsty at night), polyphagia and polyuria.  Genitourinary: Negative for dysuria.  Musculoskeletal: Negative for myalgias.  Skin: Negative for rash.  Allergic/Immunologic: Positive for environmental allergies and food allergies.  Neurological: Negative for dizziness, syncope and light-headedness.  Hematological: Positive for adenopathy.  Psychiatric/Behavioral: Negative for behavioral problems, self-injury and suicidal ideas. The patient is nervous/anxious (after pregancy on Fluoxitine and Buspar.).        Objective:   Physical Exam  Constitutional: She is oriented to person, place, and time. She appears well-developed and well-nourished.  HENT:  Head: Normocephalic and atraumatic.  Right Ear: Hearing and ear canal normal.  A middle ear effusion is present.  Left Ear: Hearing and ear canal normal. A middle ear effusion is present.  Nose: Mucosal edema present.  Mouth/Throat: Uvula is midline, oropharynx is clear and moist and mucous membranes are normal.  Eyes: Conjunctivae and EOM are normal. Pupils are equal, round, and reactive to light.  Neck:  Cervical chain on the left side  and submandibular bilaterally.  Cardiovascular: Normal rate, regular rhythm and normal heart sounds. Exam reveals no gallop and no friction rub.  No murmur heard. Pulmonary/Chest: Effort normal. She has no wheezes. She has no rales.  Lymphadenopathy:    She has cervical adenopathy.  Neurological: She is alert and oriented to person, place, and time.  Skin: Skin is warm and dry.  Psychiatric: She has a normal mood and affect. Her behavior is normal. Judgment and thought content normal.  Nursing note and vitals reviewed.  Mucosal edema on the left side turbinate Pale nasal tissue       Assessment & Plan:  Viral infection Eustachian tube dysfunction, lymphadenopathy On generic Zyrtec daily , recommended OTC Flonase take as directed.Patient continue to have lymphadenopathy and pressure in ears.  Asks for referral for ENT doctor for further evaluation. Will refer to ENT. Patient to return to clinic if symptoms worsen in the next  3-5 days or if she has any further concerns. Patient verbalizes understanding and has no questions at discharge.

## 2017-11-20 NOTE — Patient Instructions (Addendum)
Lymphadenopathy Lymphadenopathy refers to swollen or enlarged lymph glands, also called lymph nodes. Lymph glands are part of your body's defense (immune) system, which protects the body from infections, germs, and diseases. Lymph glands are found in many locations in your body, including the neck, underarm, and groin. Many things can cause lymph glands to become enlarged. When your immune system responds to germs, such as viruses or bacteria, infection-fighting cells and fluid build up. This causes the glands to grow in size. Usually, this is not something to worry about. The swelling and any soreness often go away without treatment. However, swollen lymph glands can also be caused by a number of diseases. Your health care provider may do various tests to help determine the cause. If the cause of your swollen lymph glands cannot be found, it is important to monitor your condition to make sure the swelling goes away. Follow these instructions at home: Watch your condition for any changes. The following actions may help to lessen any discomfort you are feeling:  Get plenty of rest.  Take medicines only as directed by your health care provider. Your health care provider may recommend over-the-counter medicines for pain.  Apply moist heat compresses to the site of swollen lymph nodes as directed by your health care provider. This can help reduce any pain.  Check your lymph nodes daily for any changes.  Keep all follow-up visits as directed by your health care provider. This is important.  Contact a health care provider if:  Your lymph nodes are still swollen after 2 weeks.  Your swelling increases or spreads to other areas.  Your lymph nodes are hard, seem fixed to the skin, or are growing rapidly.  Your skin over the lymph nodes is red and inflamed.  You have a fever.  You have chills.  You have fatigue.  You develop a sore throat.  You have abdominal pain.  You have weight  loss.  You have night sweats. Get help right away if:  You notice fluid leaking from the area of the enlarged lymph node.  You have severe pain in any area of your body.  You have chest pain.  You have shortness of breath. This information is not intended to replace advice given to you by your health care provider. Make sure you discuss any questions you have with your health care provider. Document Released: 08/09/2008 Document Revised: 04/07/2016 Document Reviewed: 06/05/2014 Elsevier Interactive Patient Education  2018 [employer] Eustachian Tube Dysfunction The eustachian tube connects the middle ear to the back of the nose. It regulates air pressure in the middle ear by allowing air to move between the ear and nose. It also helps to drain fluid from the middle ear space. When the eustachian tube does not function properly, air pressure, fluid, or both can build up in the middle ear. Eustachian tube dysfunction can affect one or both ears. What are the causes? This condition happens when the eustachian tube becomes blocked or cannot open normally. This may result from:  Ear infections.  Colds and other upper respiratory infections.  Allergies.  Irritation, such as from cigarette smoke or acid from the stomach coming up into the esophagus (gastroesophageal reflux).  Sudden changes in air pressure, such as from descending in an airplane.  Abnormal growths in the nose or throat, such as nasal polyps, tumors, or enlarged tissue at the back of the throat (adenoids).  What increases the risk? This condition may be more likely to develop in people  who smoke and people who are overweight. Eustachian tube dysfunction may also be more likely to develop in children, especially children who have:  Certain birth defects of the mouth, such as cleft palate.  Large tonsils and adenoids.  What are the signs or symptoms? Symptoms of this condition may include:  A feeling of fullness  in the ear.  Ear pain.  Clicking or popping noises in the ear.  Ringing in the ear.  Hearing loss.  Loss of balance.  Symptoms may get worse when the air pressure around you changes, such as when you travel to an area of high elevation or fly on an airplane. How is this diagnosed? This condition may be diagnosed based on:  Your symptoms.  A physical exam of your ear, nose, and throat.  Tests, such as those that measure: ? The movement of your eardrum (tympanogram). ? Your hearing (audiometry).  How is this treated? Treatment depends on the cause and severity of your condition. If your symptoms are mild, you may be able to relieve your symptoms by moving air into ("popping") your ears. If you have symptoms of fluid in your ears, treatment may include:  Decongestants.  Antihistamines.  Nasal sprays or ear drops that contain medicines that reduce swelling (steroids).  In some cases, you may need to have a procedure to drain the fluid in your eardrum (myringotomy). In this procedure, a small tube is placed in the eardrum to:  Drain the fluid.  Restore the air in the middle ear space.  Follow these instructions at home:  Take over-the-counter and prescription medicines only as told by your health care provider.  Use techniques to help pop your ears as recommended by your health care provider. These may include: ? Chewing gum. ? Yawning. ? Frequent, forceful swallowing. ? Closing your mouth, holding your nose closed, and gently blowing as if you are trying to blow air out of your nose.  Do not do any of the following until your health care provider approves: ? Travel to high altitudes. ? Fly in airplanes. ? Work in a Estate agent or room. ? Scuba dive.  Keep your ears dry. Dry your ears completely after showering or bathing.  Do not smoke.  Keep all follow-up visits as told by your health care provider. This is important. Contact a health care provider  if:  Your symptoms do not go away after treatment.  Your symptoms come back after treatment.  You are unable to pop your ears.  You have: ? A fever. ? Pain in your ear. ? Pain in your head or neck. ? Fluid draining from your ear.  Your hearing suddenly changes.  You become very dizzy.  You lose your balance. This information is not intended to replace advice given to you by your health care provider. Make sure you discuss any questions you have with your health care provider. Document Released: 11/27/2015 Document Revised: 04/07/2016 Document Reviewed: 11/19/2014 Elsevier Interactive Patient Education  2018 [employer]. Viral Respiratory Infection A viral respiratory infection is an illness that affects parts of the body used for breathing, like the lungs, nose, and throat. It is caused by a germ called a virus. Some examples of this kind of infection are:  A cold.  The flu (influenza).  A respiratory syncytial virus (RSV) infection.  How do I know if I have this infection? Most of the time this infection causes:  A stuffy or runny nose.  Yellow or green fluid in the  nose.  A cough.  Sneezing.  Tiredness (fatigue).  Achy muscles.  A sore throat.  Sweating or chills.  A fever.  A headache.  How is this infection treated? If the flu is diagnosed early, it may be treated with an antiviral medicine. This medicine shortens the length of time a person has symptoms. Symptoms may be treated with over-the-counter and prescription medicines, such as:  Expectorants. These make it easier to cough up mucus.  Decongestant nasal sprays.  Doctors do not prescribe antibiotic medicines for viral infections. They do not work with this kind of infection. How do I know if I should stay home? To keep others from getting sick, stay home if you have:  A fever.  A lasting cough.  A sore throat.  A runny nose.  Sneezing.  Muscles  aches.  Headaches.  Tiredness.  Weakness.  Chills.  Sweating.  An upset stomach (nausea).  Follow these instructions at home:  Rest as much as possible.  Take over-the-counter and prescription medicines only as told by your doctor.  Drink enough fluid to keep your pee (urine) clear or pale yellow.  Gargle with salt water. Do this 3-4 times per day or as needed. To make a salt-water mixture, dissolve -1 tsp of salt in 1 cup of warm water. Make sure the salt dissolves all the way.  Use nose drops made from salt water. This helps with stuffiness (congestion). It also helps soften the skin around your nose.  Do not drink alcohol.  Do not use tobacco products, including cigarettes, chewing tobacco, and e-cigarettes. If you need help quitting, ask your doctor. Get help if:  Your symptoms last for 10 days or longer.  Your symptoms get worse over time.  You have a fever.  You have very bad pain in your face or forehead.  Parts of your jaw or neck become very swollen. Get help right away if:  You feel pain or pressure in your chest.  You have shortness of breath.  You faint or feel like you will faint.  You keep throwing up (vomiting).  You feel confused. This information is not intended to replace advice given to you by your health care provider. Make sure you discuss any questions you have with your health care provider. Document Released: 10/13/2008 Document Revised: 04/07/2016 Document Reviewed: 04/08/2015 Elsevier Interactive Patient Education  2018 [employer]

## 2017-11-27 DIAGNOSIS — K760 Fatty (change of) liver, not elsewhere classified: Secondary | ICD-10-CM | POA: Diagnosis not present

## 2017-11-27 DIAGNOSIS — R110 Nausea: Secondary | ICD-10-CM | POA: Diagnosis not present

## 2017-11-27 DIAGNOSIS — R194 Change in bowel habit: Secondary | ICD-10-CM | POA: Diagnosis not present

## 2017-11-27 DIAGNOSIS — R1084 Generalized abdominal pain: Secondary | ICD-10-CM | POA: Diagnosis not present

## 2017-11-28 DIAGNOSIS — Z0000 Encounter for general adult medical examination without abnormal findings: Secondary | ICD-10-CM | POA: Diagnosis not present

## 2017-11-28 DIAGNOSIS — J019 Acute sinusitis, unspecified: Secondary | ICD-10-CM | POA: Diagnosis not present

## 2017-11-28 DIAGNOSIS — E282 Polycystic ovarian syndrome: Secondary | ICD-10-CM | POA: Diagnosis not present

## 2017-11-28 DIAGNOSIS — K760 Fatty (change of) liver, not elsewhere classified: Secondary | ICD-10-CM | POA: Diagnosis not present

## 2017-11-28 DIAGNOSIS — F419 Anxiety disorder, unspecified: Secondary | ICD-10-CM | POA: Diagnosis not present

## 2017-12-29 DIAGNOSIS — G8929 Other chronic pain: Secondary | ICD-10-CM | POA: Diagnosis not present

## 2017-12-29 DIAGNOSIS — M79672 Pain in left foot: Secondary | ICD-10-CM | POA: Diagnosis not present

## 2017-12-29 DIAGNOSIS — M7662 Achilles tendinitis, left leg: Secondary | ICD-10-CM | POA: Diagnosis not present

## 2017-12-29 DIAGNOSIS — M19072 Primary osteoarthritis, left ankle and foot: Secondary | ICD-10-CM | POA: Diagnosis not present

## 2018-01-18 DIAGNOSIS — M10072 Idiopathic gout, left ankle and foot: Secondary | ICD-10-CM | POA: Diagnosis not present

## 2018-01-18 DIAGNOSIS — M7662 Achilles tendinitis, left leg: Secondary | ICD-10-CM | POA: Diagnosis not present

## 2018-02-01 DIAGNOSIS — M5412 Radiculopathy, cervical region: Secondary | ICD-10-CM | POA: Diagnosis not present

## 2018-02-05 DIAGNOSIS — E790 Hyperuricemia without signs of inflammatory arthritis and tophaceous disease: Secondary | ICD-10-CM | POA: Diagnosis not present

## 2018-02-05 DIAGNOSIS — M79672 Pain in left foot: Secondary | ICD-10-CM

## 2018-02-05 DIAGNOSIS — G8929 Other chronic pain: Secondary | ICD-10-CM | POA: Diagnosis not present

## 2018-02-09 DIAGNOSIS — M5412 Radiculopathy, cervical region: Secondary | ICD-10-CM | POA: Diagnosis not present

## 2018-02-19 ENCOUNTER — Ambulatory Visit: Payer: BLUE CROSS/BLUE SHIELD | Admitting: Medical

## 2018-02-19 VITALS — BP 147/85 | HR 99 | Temp 98.7°F | Resp 18 | Ht 68.0 in | Wt 265.4 lb

## 2018-02-19 DIAGNOSIS — B373 Candidiasis of vulva and vagina: Secondary | ICD-10-CM

## 2018-02-19 DIAGNOSIS — H6502 Acute serous otitis media, left ear: Secondary | ICD-10-CM

## 2018-02-19 DIAGNOSIS — J0110 Acute frontal sinusitis, unspecified: Secondary | ICD-10-CM

## 2018-02-19 DIAGNOSIS — B3731 Acute candidiasis of vulva and vagina: Secondary | ICD-10-CM

## 2018-02-19 MED ORDER — FLUCONAZOLE 150 MG PO TABS: 150.0000 mg | ORAL_TABLET | Freq: Once | ORAL | 0 refills | Status: AC

## 2018-02-19 MED ORDER — AMOXICILLIN-POT CLAVULANATE 875-125 MG PO TABS: 1.0000 | ORAL_TABLET | Freq: Two times a day (BID) | ORAL | 0 refills | Status: DC

## 2018-02-19 NOTE — Progress Notes (Signed)
Subjective:    Patient ID: Melissa Martinez, female    DOB: [birth year], 32 y.o.   MRN: [id number]  HPI  32 yo female in non acute distress Took child to the doctor and diagnosed  child with teething and "picked up something in the office"  Saturday child seen and diagnosed left otitis media and URI. Friday patient started with sore throat , head congestion and post nasal drip green,  Fever on Saturday 99. 6 Sunday 102 degrees.  Just finished prednisone 3 days of 20mg  and 3 days of  10 mg on Friday for headaches.  Patient  Had some left over  Prednisone , so took Prednisone on Sunday  40mg  ,  10 mg this morning. OTC Nyquil genieric (Started Saturday night) and also took today,  Took Motrin 800 mg yesterday at lunchtime ( I don't like to take it due to my GI issues but the pressure in my head needed some relief. Has 10mg  of prednisone  Left " to take tomorrow." No cough or shortness of breath , chest pain or wheezing.  Husband sick as well with temp 101.8 today, "he is supposed to be seeing a doctor today".  Review of Systems  Constitutional: Positive for chills, diaphoresis and fever.  HENT: Positive for congestion, ear pain (mostly right side), postnasal drip, rhinorrhea, sinus pressure, sinus pain, sneezing, sore throat (firday and saturday better now), tinnitus and voice change (sounds "lower"). Negative for ear discharge, nosebleeds and trouble swallowing.   Eyes: Negative for discharge and itching.  Respiratory: Negative for cough and wheezing.   Cardiovascular: Negative for chest pain.  Gastrointestinal: Negative for abdominal pain.  Endocrine: Negative for polydipsia, polyphagia and polyuria.  Genitourinary: Negative for dysuria.  Musculoskeletal: Positive for myalgias.  Skin: Negative for rash.  Neurological: Positive for dizziness and light-headedness. Negative for syncope.  Hematological: Positive for adenopathy.  Psychiatric/Behavioral: Negative for behavioral problems,  self-injury and suicidal ideas. The patient is not nervous/anxious.        Objective:   Physical Exam  Constitutional: She is oriented to person, place, and time. She appears well-developed and well-nourished.  HENT:  Head: Normocephalic and atraumatic.  Right Ear: Hearing, external ear and ear canal normal. A middle ear effusion is present.  Left Ear: Hearing, external ear and ear canal normal. Tympanic membrane is erythematous. A middle ear effusion is present.  Nose: Mucosal edema (left side) and rhinorrhea present. Right sinus exhibits frontal sinus tenderness.  Mouth/Throat: Uvula is midline, oropharynx is clear and moist and mucous membranes are normal.  Eyes: Pupils are equal, round, and reactive to light. Conjunctivae and EOM are normal.  Neck: Neck supple.  Cardiovascular: Normal rate, regular rhythm and normal heart sounds.  Pulmonary/Chest: Effort normal and breath sounds normal.  Neurological: She is alert and oriented to person, place, and time.  Skin: Skin is warm and dry.  Psychiatric: She has a normal mood and affect. Her behavior is normal. Judgment and thought content normal.  Nursing note and vitals reviewed.     Forehead pain right    Assessment & Plan:  Sinusitis frontal otitis media left Vulva candidiasis secondary to antibiotics. Meds ordered this encounter  Medications  . amoxicillin-clavulanate (AUGMENTIN) 875-125 MG tablet    Sig: Take 1 tablet by mouth 2 (two) times daily.    Dispense:  20 tablet    Refill:  0  . fluconazole (DIFLUCAN) 150 MG tablet    Sig: Take 1 tablet (150 mg total) by mouth once  for 1 dose. After antibiotic treatment    Dispense:  1 tablet    Refill:  0  Return in 3-5 days if not improving. Patient verbalizes understanding and has no questions at discharge.   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 Surgery pending on her Achilles tendon in June by Dr. [facility]owler [facility].

## 2018-02-19 NOTE — Patient Instructions (Signed)
Otitis Media, Adult Otitis media is redness, soreness, and puffiness (swelling) in the space just behind your eardrum (middle ear). It may be caused by allergies or infection. It often happens along with a cold. Follow these instructions at home:  Take your medicine as told. Finish it even if you start to feel better.  Only take over-the-counter or prescription medicines for pain, discomfort, or fever as told by your doctor.  Follow up with your doctor as told. Contact a doctor if:  You have otitis media only in one ear, or bleeding from your nose, or both.  You notice a lump on your neck.  You are not getting better in 3-5 days.  You feel worse instead of better. Get help right away if:  You have pain that is not helped with medicine.  You have puffiness, redness, or pain around your ear.  You get a stiff neck.  You cannot move part of your face (paralysis).  You notice that the bone behind your ear hurts when you touch it. This information is not intended to replace advice given to you by your health care provider. Make sure you discuss any questions you have with your health care provider. Document Released: 04/18/2008 Document Revised: 04/07/2016 Document Reviewed: 05/28/2013 Elsevier Interactive Patient Education  2017 Elsevier Inc. Sinusitis, Adult Sinusitis is soreness and inflammation of your sinuses. Sinuses are hollow spaces in the bones around your face. They are located:  Around your eyes.  In the middle of your forehead.  Behind your nose.  In your cheekbones.  Your sinuses and nasal passages are lined with a stringy fluid (mucus). Mucus normally drains out of your sinuses. When your nasal tissues get inflamed or swollen, the mucus can get trapped or blocked so air cannot flow through your sinuses. This lets bacteria, viruses, and funguses grow, and that leads to infection. Follow these instructions at home: Medicines  Take, use, or apply over-the-counter  and prescription medicines only as told by your doctor. These may include nasal sprays.  If you were prescribed an antibiotic medicine, take it as told by your doctor. Do not stop taking the antibiotic even if you start to feel better. Hydrate and Humidify  Drink enough water to keep your pee (urine) clear or pale yellow.  Use a cool mist humidifier to keep the humidity level in your home above 50%.  Breathe in steam for 10-15 minutes, 3-4 times a day or as told by your doctor. You can do this in the bathroom while a hot shower is running.  Try not to spend time in cool or dry air. Rest  Rest as much as possible.  Sleep with your head raised (elevated).  Make sure to get enough sleep each night. General instructions  Put a warm, moist washcloth on your face 3-4 times a day or as told by your doctor. This will help with discomfort.  Wash your hands often with soap and water. If there is no soap and water, use hand sanitizer.  Do not smoke. Avoid being around people who are smoking (secondhand smoke).  Keep all follow-up visits as told by your doctor. This is important. Contact a doctor if:  You have a fever.  Your symptoms get worse.  Your symptoms do not get better within 10 days. Get help right away if:  You have a very bad headache.  You cannot stop throwing up (vomiting).  You have pain or swelling around your face or eyes.  You have trouble   seeing.  You feel confused.  Your neck is stiff.  You have trouble breathing. This information is not intended to replace advice given to you by your health care provider. Make sure you discuss any questions you have with your health care provider. Document Released: 04/18/2008 Document Revised: 06/26/2016 Document Reviewed: 08/26/2015 Elsevier Interactive Patient Education  2018 Elsevier Inc.  

## 2018-02-26 ENCOUNTER — Ambulatory Visit: Payer: BLUE CROSS/BLUE SHIELD | Admitting: Medical

## 2018-02-26 VITALS — BP 158/93 | HR 98 | Temp 98.5°F | Resp 18 | Ht 68.0 in | Wt 270.2 lb

## 2018-02-26 DIAGNOSIS — R059 Cough, unspecified: Secondary | ICD-10-CM

## 2018-02-26 DIAGNOSIS — R05 Cough: Secondary | ICD-10-CM

## 2018-02-26 MED ORDER — FLUTICASONE-SALMETEROL 100-50 MCG/DOSE IN AEPB: 1.0000 | INHALATION_SPRAY | Freq: Two times a day (BID) | RESPIRATORY_TRACT | 3 refills | Status: DC

## 2018-02-26 MED ORDER — BENZONATATE 100 MG PO CAPS: 100.0000 mg | ORAL_CAPSULE | Freq: Three times a day (TID) | ORAL | 0 refills | Status: DC | PRN

## 2018-02-26 NOTE — Progress Notes (Signed)
   Subjective:    Patient ID: Melissa Martinez, female    DOB: 04-Mar-1986, 32 y.o.   MRN: [id number]  HPI 32 yo female in non acute distress. Returns to clinic with cough. Seems like she has a lot of post nasal drainage. Diagnosed with Sinusitis and Left Otis media on  02/19/2018.Seems to cough with talking. Ear pain has now resolved. Coughing productive at time sometimes clear sometimes with a little green in it.Still has 3 days of antibiotics left. . Fever yesterday 99.6.   Review of Systems  Constitutional: Positive for fever. Negative for chills.  HENT: Positive for ear pain, sinus pressure and sinus pain. Negative for congestion.   Respiratory: Positive for cough and shortness of breath (with moving alot.). Negative for wheezing.   Cardiovascular: Negative for chest pain and leg swelling.  Gastrointestinal: Negative for abdominal pain.  Genitourinary: Negative for dysuria.  Musculoskeletal: Positive for myalgias (soreness from coughing).  Skin: Negative for rash.  Neurological: Negative for dizziness, syncope and light-headedness.  Hematological: Negative for adenopathy.  Psychiatric/Behavioral: Negative for behavioral problems, self-injury and suicidal ideas.   Has previously had to be on steroid inhaler and albuterol MDI due to reactive airway disease with getting URI.    Objective:   Physical Exam  Constitutional: She is oriented to person, place, and time. She appears well-developed and well-nourished.  HENT:  Head: Normocephalic and atraumatic.  Right Ear: Hearing, external ear and ear canal normal. A middle ear effusion is present.  Left Ear: Hearing, external ear and ear canal normal. A middle ear effusion is present.  Eyes: Pupils are equal, round, and reactive to light. Conjunctivae and EOM are normal.  Cardiovascular: Normal rate, regular rhythm and normal heart sounds.  Pulmonary/Chest: Effort normal and breath sounds normal.  Neurological: She is alert and oriented to  person, place, and time.  Skin: Skin is warm and dry.  Psychiatric: She has a normal mood and affect. Her behavior is normal. Judgment and thought content normal.  Nursing note and vitals reviewed.   No redness of TMs bilaterally. Mild cough noted in room.     Assessment & Plan:  Cough, PND, Eustachian tube dysfunction.  Rest and increase fluids.  Had blood test for allergies no allergies showed up ( she was pregnant at the time) , prick test 2010 showed she was allergic to cat, tree pollen, grass pollen.  Tried Almond milk had hand itching trying to do less dairy due to weight.Can eat a few nuts without any itching or other allergy symptoms.  Start OTC Flonase ( patient says she has some at home).Call in  2-3 days if productive cough not improving.   Will consider another antibiotic at that time if productive and green and/or fever. Meds ordered this encounter  Medications  . Fluticasone-Salmeterol (ADVAIR) 100-50 MCG/DOSE AEPB    Sig: Inhale 1 puff into the lungs 2 (two) times daily.    Dispense:  1 each    Refill:  3  . benzonatate (TESSALON PERLES) 100 MG capsule    Sig: Take 1 capsule (100 mg total) by mouth 3 (three) times daily as needed for cough.    Dispense:  20 capsule    Refill:  0  .Can take plain OTC Mucinex as directed. OTC Motrin or Tylenol for fever or if not feeling well take as directed on the bottle. Return to clinic in 3-5 days if not improving or call.  Patient verbalizes understanding and has no questions at discharge.

## 2018-02-26 NOTE — Patient Instructions (Signed)

## 2018-03-06 DIAGNOSIS — Z0000 Encounter for general adult medical examination without abnormal findings: Secondary | ICD-10-CM | POA: Diagnosis not present

## 2018-03-06 DIAGNOSIS — Z0184 Encounter for antibody response examination: Secondary | ICD-10-CM | POA: Diagnosis not present

## 2018-03-06 DIAGNOSIS — Z111 Encounter for screening for respiratory tuberculosis: Secondary | ICD-10-CM | POA: Diagnosis not present

## 2018-03-09 ENCOUNTER — Ambulatory Visit: Payer: BLUE CROSS/BLUE SHIELD | Admitting: Adult Health

## 2018-03-09 ENCOUNTER — Encounter: Payer: Self-pay | Admitting: Adult Health

## 2018-03-09 VITALS — BP 136/82 | HR 84 | Temp 98.9°F | Resp 16 | Ht 68.0 in | Wt 273.0 lb

## 2018-03-09 DIAGNOSIS — H60501 Unspecified acute noninfective otitis externa, right ear: Secondary | ICD-10-CM

## 2018-03-09 DIAGNOSIS — Z111 Encounter for screening for respiratory tuberculosis: Secondary | ICD-10-CM | POA: Diagnosis not present

## 2018-03-09 DIAGNOSIS — H66004 Acute suppurative otitis media without spontaneous rupture of ear drum, recurrent, right ear: Secondary | ICD-10-CM

## 2018-03-09 MED ORDER — AZITHROMYCIN 250 MG PO TABS: ORAL_TABLET | ORAL | 0 refills | Status: DC

## 2018-03-09 MED ORDER — PREDNISONE 10 MG (21) PO TBPK: ORAL_TABLET | ORAL | 0 refills | Status: DC

## 2018-03-09 MED ORDER — NEOMYCIN-POLYMYXIN-HC 3.5-10000-1 OT SOLN: 4.0000 [drp] | Freq: Four times a day (QID) | OTIC | 0 refills | Status: DC

## 2018-03-09 NOTE — Patient Instructions (Signed)
Otitis Externa Otitis externa is an infection of the outer ear canal. The outer ear canal is the area between the outside of the ear and the eardrum. Otitis externa is sometimes called "swimmer's ear." Follow these instructions at home:  If you were given antibiotic ear drops, use them as told by your doctor. Do not stop using them even if your condition gets better.  Take over-the-counter and prescription medicines only as told by your doctor.  Keep all follow-up visits as told by your doctor. This is important. How is this prevented?  Keep your ear dry. Use the corner of a towel to dry your ear after you swim or bathe.  Try not to scratch or put things in your ear. Doing these things makes it easier for germs to grow in your ear.  Avoid swimming in lakes, dirty water, or pools that may not have the right amount of a chemical called chlorine.  Consider making ear drops and putting 3 or 4 drops in each ear after you swim. Ask your doctor about how you can make ear drops. Contact a doctor if:  You have a fever.  After 3 days your ear is still red, swollen, or painful.  After 3 days you still have pus coming from your ear.  Your redness, swelling, or pain gets worse.  You have a really bad headache.  You have redness, swelling, pain, or tenderness behind your ear. This information is not intended to replace advice given to you by your health care provider. Make sure you discuss any questions you have with your health care provider. Document Released: 04/18/2008 Document Revised: 11/26/2015 Document Reviewed: 08/10/2015 Elsevier Interactive Patient Education  2018 Else[employer] Inc.  Otitis Media, Adult Otitis media is redness, soreness, and puffiness (swelling) in the space just behind your eardrum (middle ear). It may be caused by allergies or infection. It often happens along with a cold. Follow these instructions at home:  Take your medicine as told. Finish it even if you start to  feel better.  Only take over-the-counter or prescription medicines for pain, discomfort, or fever as told by your doctor.  Follow up with your doctor as told. Contact a doctor if:  You have otitis media only in one ear, or bleeding from your nose, or both.  You notice a lump on your neck.  You are not getting better in 3-5 days.  You feel worse instead of better. Get help right away if:  You have pain that is not helped with medicine.  You have puffiness, redness, or pain around your ear.  You get a stiff neck.  You cannot move part of your face (paralysis).  You notice that the bone behind your ear hurts when you touch it. This information is not intended to replace advice given to you by your health care provider. Make sure you discuss any questions you have with your health care provider. Document Released: 04/18/2008 Document Revised: 04/07/2016 Document Reviewed: 05/28/2013 Elsevier Interactive Patient Education  2017 Elsevier Inc.  

## 2018-03-09 NOTE — Progress Notes (Signed)
Subjective:    Patient ID: Melissa Martinez, female    DOB: 01-11-1986, 32 y.o.   MRN: [id number] .  Patient is a 32 year old female in no acute distress who comes to the clinic with complaints of right ear pain and fullness. She also reports right sided throat pain.  She [facility] in [facility] 2019- she was given antibiotics unsure which one she says. She declined steroids at that time. She reports she did not have a follow up appointment. She is unsure who she saw at that time.   Denies swimming.  Patient  denies any fever, body aches,chills, rash, chest pain, shortness of breath, nausea, vomiting, or diarrhea.   Not breastfeeding.  Denies any chance of pregnancy per patient declines pregnancy test offered on constant birth control pills and denies missing. Regular periods/   Otalgia   There is pain in the right ear. This is a recurrent (2 weeks has hurt improved now worse ) problem. The current episode started 1 to 4 weeks ago. The problem occurs constantly. The problem has been gradually worsening. There has been no fever. The pain is at a severity of 4/10. The pain is mild. Associated symptoms include coughing (mild tickle cough ) and a sore throat (right side throat only ). Pertinent negatives include no abdominal pain, diarrhea, ear discharge, headaches, hearing loss, neck pain, rash, rhinorrhea or vomiting. The treatment provided mild relief. There is no history of a chronic ear infection, hearing loss or a tympanostomy tube.      Review of Systems  Constitutional: Negative.   HENT: Positive for ear pain and sore throat (right side throat only ). Negative for congestion, dental problem, drooling, ear discharge, facial swelling, hearing loss, mouth sores, nosebleeds, postnasal drip, rhinorrhea, sinus pressure, sinus pain, sneezing, tinnitus, trouble swallowing and voice change.   Eyes: Negative.   Respiratory: Positive for cough (mild tickle cough ). Negative for shortness of  breath, wheezing and stridor.   Cardiovascular: Negative.   Gastrointestinal: Negative.  Negative for abdominal pain, diarrhea and vomiting.       Sees GI for chronic issue none currently at office visit   Endocrine: Negative.   Genitourinary: Negative.   Musculoskeletal: Negative.  Negative for neck pain.  Skin: Negative for rash.  Allergic/Immunologic: Negative for environmental allergies, food allergies and immunocompromised state.        -- Levofloxacin -- Itching and Other (See Comments)   --  Throat tightening, generalized itching, redness  -- Reglan (Metoclopramide) -- Anxiety   --  Causes paranoia  -- Other    --  splenda- itching, hives  -- Bactrim (Sulfamethoxazole-Trimethoprim) -- Itching   --  Also redness  -- Equal (Aspartame) -- Itching  -- Levaquin (Levofloxacin In D5w) -- Other (See Comments)   --  Throat tightening, generalized itching, redness  -- Sulfa Antibiotics    Neurological: Negative for dizziness, tremors, seizures, syncope, facial asymmetry, speech difficulty, weakness, light-headedness, numbness and headaches.  Hematological: Negative.   Psychiatric/Behavioral: Negative.        Objective:   Physical Exam  Constitutional: She is oriented to person, place, and time. Vital signs are normal. She appears well-developed and well-nourished. She is active.  Non-toxic appearance. She does not have a sickly appearance. She does not appear ill. No distress.  HENT:  Head: Normocephalic and atraumatic.  Right Ear: Hearing normal. There is swelling (darker fluid behind tympanic membrane/ mild swelling ). No tenderness. A middle ear effusion is present.  Left Ear: Hearing, external ear and ear canal normal. A middle ear effusion is present.  Nose: Nose normal.  Mouth/Throat: Oropharynx is clear and moist. No oropharyngeal exudate.  Eyes: Pupils are equal, round, and reactive to light. Conjunctivae and EOM are normal. Right eye exhibits no discharge. Left eye exhibits  no discharge. No scleral icterus.  Neck: Normal range of motion. Neck supple. No JVD present. No tracheal deviation present.  Cardiovascular: Normal rate, regular rhythm, normal heart sounds and intact distal pulses. Exam reveals no gallop and no friction rub.  No murmur heard. Pulmonary/Chest: Effort normal and breath sounds normal. No stridor. No respiratory distress. She has no wheezes. She has no rales. She exhibits no tenderness.  Abdominal: Soft. Bowel sounds are normal.  Musculoskeletal: Normal range of motion.  Lymphadenopathy:    She has no cervical adenopathy.  Neurological: She is alert and oriented to person, place, and time. She displays normal reflexes. No cranial nerve deficit. She exhibits normal muscle tone. Coordination normal.  Skin: Skin is warm and dry. No rash noted. She is not diaphoretic. No erythema. No pallor.  Psychiatric: She has a normal mood and affect. Her behavior is normal. Judgment and thought content normal.  Vitals reviewed.     Assessment & Plan:   Recurrent acute suppurative otitis media of right ear without spontaneous rupture of tympanic membrane  Acute otitis externa of right ear, unspecified type  Meds ordered this encounter  Medications  . predniSONE (STERAPRED UNI-PAK 21 TAB) 10 MG (21) TBPK tablet    Sig: By mouth Take 6 tablets on day 1, Take 5 tablets day 2 Take 4 tablets day 3 Take 3 tablets day 4 Take 2 tablets day five 5 Take 1 tablet day    Dispense:  21 tablet    Refill:  0  . neomycin-polymyxin-hydrocortisone (CORTISPORIN) OTIC solution    Sig: Place 4 drops into the right ear 4 (four) times daily.    Dispense:  10 mL    Refill:  0  . azithromycin (ZITHROMAX) 250 MG tablet    Sig: By mouth Take 2 tablets day 1 (500mg  total) and 1 tablet ( 250 mg ) days 2,3,4,5.    Dispense:  6 tablet    Refill:  0   Will treat possibly atypical bacteria as patient was on Cefdinir in January and treated with Augmentin 02/19/18- she reports her ear  has never really cleared up completely feels better then returns. She is advised to go to the emergency room or urgent care if symptoms worsen or do not improve. She is advised to call her ENT today for a follow up appointment next  week. Importance of regular rechecks by ENT and chance of damage to ear, loss of hearing if not proper follow up. Discussed C-difficile risks with multiple antibiotics and preventative measures. She verbalized understanding and will call and schedule with ENT.   Advised patient call the office or your primary care doctor for an appointment if no improvement within 72 hours or if any symptoms change or worsen at any time  Advised ER or urgent Care if after hours or on weekend. Call 911 for emergency symptoms at any time.Patinet verbalized understanding of all instructions given/reviewed and treatment plan and has no further questions or concerns at this time.    Patient verbalized understanding of all instructions given and denies any further questions at this time.

## 2018-03-13 DIAGNOSIS — M5412 Radiculopathy, cervical region: Secondary | ICD-10-CM | POA: Diagnosis not present

## 2018-03-13 DIAGNOSIS — R51 Headache: Secondary | ICD-10-CM | POA: Diagnosis not present

## 2018-04-12 ENCOUNTER — Encounter
Admission: RE | Admit: 2018-04-12 | Discharge: 2018-04-12 | Disposition: A | Discharge status: Home / Self Care | Payer: BLUE CROSS/BLUE SHIELD | Source: Ambulatory Visit | Attending: Podiatry | Admitting: Podiatry

## 2018-04-12 ENCOUNTER — Other Ambulatory Visit: Payer: Self-pay | Admitting: Adult Health

## 2018-04-12 ENCOUNTER — Other Ambulatory Visit: Payer: Self-pay | Admitting: Podiatry

## 2018-04-12 ENCOUNTER — Other Ambulatory Visit: Payer: Self-pay

## 2018-04-12 DIAGNOSIS — Z01818 Encounter for other preprocedural examination: Secondary | ICD-10-CM | POA: Insufficient documentation

## 2018-04-12 DIAGNOSIS — M7662 Achilles tendinitis, left leg: Secondary | ICD-10-CM | POA: Diagnosis not present

## 2018-04-12 DIAGNOSIS — M898X7 Other specified disorders of bone, ankle and foot: Secondary | ICD-10-CM | POA: Diagnosis not present

## 2018-04-12 NOTE — Patient Instructions (Signed)
Your procedure is scheduled [id number] 04/20/18 Report to Day Surgery. To find out your arrival time please call [phone] between 1PM - 3PM on Thurs.04/19/18  Remember: Instructions that are not followed completely may result in serious medical risk, up to and including death, or upon the discretion of your surgeon and anesthesiologist your surgery may need to be rescheduled.     _X__ 1. Do not eat food after midnight the night before your procedure.                 No gum chewing or hard candies. You may drink clear liquids up to 2 hours                 before you are scheduled to arrive for your surgery- DO not drink clear                 liquids within 2 hours of the start of your surgery.                 Clear Liquids include:  water, apple juice without pulp, clear carbohydrate                 drink such as Clearfast of Gartorade, Black Coffee or Tea (Do not add                 anything to coffee or tea).  __X__2.  On the morning of surgery brush your teeth with toothpaste and water, you may rinse your mouth with mouthwash if you wish.  Do not swallow any    toothpaste of mouthwash.     _X__ 3.  No Alcohol for 24 hours before or after surgery.   ___ 4.  Do Not Smoke or use e-cigarettes For 24 Hours Prior to Your Surgery.                 Do not use any chewable tobacco products for at least 6 hours prior to                 surgery.  ____  5.  Bring all medications with you on the day of surgery if instructed.   __x__  6.  Notify your doctor if there is any change in your medical condition      (cold, fever, infections).     Do not wear jewelry, make-up, hairpins, clips or nail polish. Do not wear lotions, powders, or perfumes. You may wear deodorant. Do not shave 48 hours prior to surgery. Men may shave face and neck. Do not bring valuables to the hospital.    [facility] is not responsible for any belongings or valuables.  Contacts, dentures or  bridgework may not be worn into surgery. Leave your suitcase in the car. After surgery it may be brought to your room. For patients admitted to the hospital, discharge time is determined by your treatment team.   Patients discharged the day of surgery will not be allowed to drive home.   Please read over the following fact sheets that you were given:    __x__ Take these medicines the morning of surgery with A SIP OF WATER:    1. omeprazole (PRILOSEC) 20 MG capsule  2.   3.   4.  5.  6.  ____ Fleet Enema (as directed)   __x__ Use CHG Soap as directed  ____ Use inhalers on the day of surgery  __x__ Stop metformin 2 days prior to surgery last dose  6/4    ____ Take 1/2 of usual insulin dose the night before surgery. No insulin the morning          of surgery.   ____ Stop Coumadin/Plavix/aspirin on   ____ Stop Anti-inflammatories on    _x___ Stop supplements until after surgery.  KRILL OIL PO,vitamin C (ASCORBIC ACID) 500 MG tablet tomorrow  ____ Bring C-Pap to the hospital.

## 2018-04-16 DIAGNOSIS — M542 Cervicalgia: Secondary | ICD-10-CM | POA: Diagnosis not present

## 2018-04-16 DIAGNOSIS — G8929 Other chronic pain: Secondary | ICD-10-CM | POA: Insufficient documentation

## 2018-04-16 DIAGNOSIS — R51 Headache: Secondary | ICD-10-CM | POA: Diagnosis not present

## 2018-04-16 DIAGNOSIS — R519 Headache, unspecified: Secondary | ICD-10-CM | POA: Insufficient documentation

## 2018-04-19 MED ORDER — CEFAZOLIN SODIUM-DEXTROSE 2-4 GM/100ML-% IV SOLN
2.0000 g | INTRAVENOUS | Status: AC
  Administered 2018-04-20: 2 g via INTRAVENOUS

## 2018-04-20 ENCOUNTER — Encounter: Payer: Self-pay | Admitting: Anesthesiology

## 2018-04-20 ENCOUNTER — Ambulatory Visit: Payer: BLUE CROSS/BLUE SHIELD | Admitting: Anesthesiology

## 2018-04-20 ENCOUNTER — Ambulatory Visit
Admission: RE | Admit: 2018-04-20 | Discharge: 2018-04-20 | Disposition: A | Discharge status: Home / Self Care | Payer: BLUE CROSS/BLUE SHIELD | Source: Ambulatory Visit | Attending: Podiatry | Admitting: Podiatry

## 2018-04-20 ENCOUNTER — Encounter
Admission: RE | Disposition: A | Discharge status: Home / Self Care | Payer: Self-pay | Source: Ambulatory Visit | Attending: Podiatry

## 2018-04-20 DIAGNOSIS — S86002A Unspecified injury of left Achilles tendon, initial encounter: Secondary | ICD-10-CM | POA: Diagnosis not present

## 2018-04-20 DIAGNOSIS — M7662 Achilles tendinitis, left leg: Secondary | ICD-10-CM | POA: Insufficient documentation

## 2018-04-20 DIAGNOSIS — M7732 Calcaneal spur, left foot: Secondary | ICD-10-CM | POA: Diagnosis not present

## 2018-04-20 DIAGNOSIS — Z6841 Body Mass Index (BMI) 40.0 and over, adult: Secondary | ICD-10-CM | POA: Diagnosis not present

## 2018-04-20 DIAGNOSIS — F419 Anxiety disorder, unspecified: Secondary | ICD-10-CM | POA: Diagnosis not present

## 2018-04-20 HISTORY — PX: OSTECTOMY: SHX6439

## 2018-04-20 HISTORY — PX: ACHILLES TENDON SURGERY: SHX542

## 2018-04-20 LAB — POCT PREGNANCY, URINE: Preg Test, Ur: NEGATIVE

## 2018-04-20 SURGERY — REPAIR, TENDON, ACHILLES
Anesthesia: General | Site: Foot | Laterality: Left | Wound class: Clean

## 2018-04-20 MED ORDER — FENTANYL CITRATE (PF) 100 MCG/2ML IJ SOLN
INTRAMUSCULAR | Status: AC
  Filled 2018-04-20: qty 2

## 2018-04-20 MED ORDER — LIDOCAINE HCL (PF) 2 % IJ SOLN
INTRAMUSCULAR | Status: AC
  Filled 2018-04-20: qty 10

## 2018-04-20 MED ORDER — CEFAZOLIN SODIUM-DEXTROSE 2-4 GM/100ML-% IV SOLN
INTRAVENOUS | Status: AC
  Filled 2018-04-20: qty 100

## 2018-04-20 MED ORDER — FENTANYL CITRATE (PF) 100 MCG/2ML IJ SOLN
25.0000 ug | INTRAMUSCULAR | Status: AC | PRN
  Administered 2018-04-20 (×6): 25 ug via INTRAVENOUS

## 2018-04-20 MED ORDER — ROCURONIUM BROMIDE 50 MG/5ML IV SOLN
INTRAVENOUS | Status: AC
  Filled 2018-04-20: qty 1

## 2018-04-20 MED ORDER — ONDANSETRON HCL 4 MG/2ML IJ SOLN
INTRAMUSCULAR | Status: DC | PRN
  Administered 2018-04-20: 4 mg via INTRAVENOUS

## 2018-04-20 MED ORDER — SUCCINYLCHOLINE CHLORIDE 20 MG/ML IJ SOLN
INTRAMUSCULAR | Status: DC | PRN
  Administered 2018-04-20: 120 mg via INTRAVENOUS

## 2018-04-20 MED ORDER — ONDANSETRON HCL 4 MG/2ML IJ SOLN
INTRAMUSCULAR | Status: AC
  Filled 2018-04-20: qty 2

## 2018-04-20 MED ORDER — LACTATED RINGERS IV SOLN
INTRAVENOUS | Status: DC
  Administered 2018-04-20: 07:00:00 via INTRAVENOUS

## 2018-04-20 MED ORDER — MIDAZOLAM HCL 2 MG/2ML IJ SOLN
INTRAMUSCULAR | Status: AC
  Filled 2018-04-20: qty 2

## 2018-04-20 MED ORDER — ONDANSETRON HCL 4 MG/2ML IJ SOLN: 4.0000 mg | Freq: Four times a day (QID) | INTRAMUSCULAR | Status: DC | PRN

## 2018-04-20 MED ORDER — ONDANSETRON HCL 4 MG/2ML IJ SOLN
4.0000 mg | Freq: Once | INTRAMUSCULAR | Status: AC | PRN
  Administered 2018-04-20: 4 mg via INTRAVENOUS

## 2018-04-20 MED ORDER — LIDOCAINE HCL (PF) 1 % IJ SOLN
INTRAMUSCULAR | Status: AC
  Filled 2018-04-20: qty 30

## 2018-04-20 MED ORDER — PROMETHAZINE HCL 25 MG/ML IJ SOLN
6.2500 mg | Freq: Once | INTRAMUSCULAR | Status: AC
  Administered 2018-04-20: 6.25 mg via INTRAVENOUS

## 2018-04-20 MED ORDER — BUPIVACAINE LIPOSOME 1.3 % IJ SUSP
INTRAMUSCULAR | Status: AC
  Filled 2018-04-20: qty 20

## 2018-04-20 MED ORDER — CHLORHEXIDINE GLUCONATE 4 % EX LIQD: 60.0000 mL | Freq: Once | CUTANEOUS | Status: DC

## 2018-04-20 MED ORDER — SODIUM CHLORIDE FLUSH 0.9 % IV SOLN
INTRAVENOUS | Status: AC
  Filled 2018-04-20: qty 10

## 2018-04-20 MED ORDER — PROMETHAZINE HCL 25 MG/ML IJ SOLN
INTRAMUSCULAR | Status: AC
  Administered 2018-04-20: 6.25 mg via INTRAVENOUS
  Filled 2018-04-20: qty 1

## 2018-04-20 MED ORDER — SUCCINYLCHOLINE CHLORIDE 20 MG/ML IJ SOLN
INTRAMUSCULAR | Status: AC
  Filled 2018-04-20: qty 1

## 2018-04-20 MED ORDER — BUPIVACAINE-EPINEPHRINE (PF) 0.25% -1:200000 IJ SOLN
INTRAMUSCULAR | Status: AC
  Filled 2018-04-20: qty 30

## 2018-04-20 MED ORDER — ROCURONIUM BROMIDE 100 MG/10ML IV SOLN
INTRAVENOUS | Status: DC | PRN
  Administered 2018-04-20: 40 mg via INTRAVENOUS
  Administered 2018-04-20: 10 mg via INTRAVENOUS
  Administered 2018-04-20: 20 mg via INTRAVENOUS

## 2018-04-20 MED ORDER — POVIDONE-IODINE 7.5 % EX SOLN
Freq: Once | CUTANEOUS | Status: DC
  Filled 2018-04-20: qty 118

## 2018-04-20 MED ORDER — SUGAMMADEX SODIUM 200 MG/2ML IV SOLN
INTRAVENOUS | Status: AC
  Filled 2018-04-20: qty 2

## 2018-04-20 MED ORDER — FENTANYL CITRATE (PF) 100 MCG/2ML IJ SOLN
INTRAMUSCULAR | Status: AC
  Administered 2018-04-20: 25 ug via INTRAVENOUS
  Filled 2018-04-20: qty 2

## 2018-04-20 MED ORDER — DEXAMETHASONE SODIUM PHOSPHATE 10 MG/ML IJ SOLN
INTRAMUSCULAR | Status: DC | PRN
  Administered 2018-04-20: 10 mg via INTRAVENOUS

## 2018-04-20 MED ORDER — BUPIVACAINE LIPOSOME 1.3 % IJ SUSP
INTRAMUSCULAR | Status: DC | PRN
  Administered 2018-04-20 (×2): 10 mL

## 2018-04-20 MED ORDER — OXYCODONE-ACETAMINOPHEN 5-325 MG PO TABS: 1.0000 | ORAL_TABLET | Freq: Four times a day (QID) | ORAL | 0 refills | Status: DC | PRN

## 2018-04-20 MED ORDER — BUPIVACAINE HCL (PF) 0.5 % IJ SOLN
INTRAMUSCULAR | Status: AC
  Filled 2018-04-20: qty 30

## 2018-04-20 MED ORDER — FENTANYL CITRATE (PF) 100 MCG/2ML IJ SOLN
INTRAMUSCULAR | Status: DC | PRN
  Administered 2018-04-20 (×2): 100 ug via INTRAVENOUS

## 2018-04-20 MED ORDER — BUPIVACAINE HCL (PF) 0.25 % IJ SOLN
INTRAMUSCULAR | Status: AC
  Filled 2018-04-20: qty 30

## 2018-04-20 MED ORDER — PROCHLORPERAZINE MALEATE 5 MG PO TABS: 5.0000 mg | ORAL_TABLET | Freq: Four times a day (QID) | ORAL | 0 refills | Status: DC | PRN

## 2018-04-20 MED ORDER — PROPOFOL 10 MG/ML IV BOLUS
INTRAVENOUS | Status: DC | PRN
  Administered 2018-04-20: 185 mg via INTRAVENOUS

## 2018-04-20 MED ORDER — BUPIVACAINE HCL (PF) 0.25 % IJ SOLN
INTRAMUSCULAR | Status: DC | PRN
  Administered 2018-04-20: 10 mL

## 2018-04-20 MED ORDER — SUGAMMADEX SODIUM 200 MG/2ML IV SOLN
INTRAVENOUS | Status: DC | PRN
  Administered 2018-04-20: 200 mg via INTRAVENOUS

## 2018-04-20 MED ORDER — PROPOFOL 10 MG/ML IV BOLUS
INTRAVENOUS | Status: AC
  Filled 2018-04-20: qty 20

## 2018-04-20 MED ORDER — ONDANSETRON HCL 4 MG PO TABS: 4.0000 mg | ORAL_TABLET | Freq: Four times a day (QID) | ORAL | Status: DC | PRN

## 2018-04-20 MED ORDER — BUPIVACAINE-EPINEPHRINE 0.25% -1:200000 IJ SOLN
INTRAMUSCULAR | Status: DC | PRN
  Administered 2018-04-20: 4 mL

## 2018-04-20 MED ORDER — DEXAMETHASONE SODIUM PHOSPHATE 10 MG/ML IJ SOLN
INTRAMUSCULAR | Status: AC
  Filled 2018-04-20: qty 1

## 2018-04-20 MED ORDER — LIDOCAINE HCL (CARDIAC) PF 100 MG/5ML IV SOSY
PREFILLED_SYRINGE | INTRAVENOUS | Status: DC | PRN
  Administered 2018-04-20: 100 mg via INTRAVENOUS

## 2018-04-20 MED ORDER — MIDAZOLAM HCL 2 MG/2ML IJ SOLN
INTRAMUSCULAR | Status: DC | PRN
  Administered 2018-04-20: 2 mg via INTRAVENOUS

## 2018-04-20 SURGICAL SUPPLY — 62 items
ANCHOR SUT 5.5 SPEEDSCREW (Screw) ×4 IMPLANT
BANDAGE ELASTIC 4 LF NS (GAUZE/BANDAGES/DRESSINGS) ×4 IMPLANT
BLADE SURG 15 STRL LF DISP TIS (BLADE) ×3 IMPLANT
BLADE SURG 15 STRL SS (BLADE) ×3
BLADE SURG MINI STRL (BLADE) ×2 IMPLANT
BNDG CONFORM 3 STRL LF (GAUZE/BANDAGES/DRESSINGS) ×2 IMPLANT
BNDG ESMARK 4X12 TAN STRL LF (GAUZE/BANDAGES/DRESSINGS) ×2 IMPLANT
BNDG ESMARK 6X12 TAN STRL LF (GAUZE/BANDAGES/DRESSINGS) ×2 IMPLANT
CANISTER SUCT 1200ML W/VALVE (MISCELLANEOUS) ×2 IMPLANT
CUFF TOURN 30 STER DUAL PORT (MISCELLANEOUS) ×2 IMPLANT
DRAPE FLUOR MINI C-ARM 54X84 (DRAPES) ×2 IMPLANT
DURAPREP 26ML APPLICATOR (WOUND CARE) ×2 IMPLANT
ELECT REM PT RETURN 9FT ADLT (ELECTROSURGICAL) ×2
ELECTRODE REM PT RTRN 9FT ADLT (ELECTROSURGICAL) ×1 IMPLANT
GAUZE PETRO XEROFOAM 1X8 (MISCELLANEOUS) ×2 IMPLANT
GAUZE SPONGE 4X4 12PLY STRL (GAUZE/BANDAGES/DRESSINGS) ×2 IMPLANT
GAUZE STRETCH 2X75IN STRL (MISCELLANEOUS) ×2 IMPLANT
GAUZE XEROFORM 4X4 STRL (GAUZE/BANDAGES/DRESSINGS) ×2 IMPLANT
GLOVE BIO SURGEON STRL SZ7.5 (GLOVE) ×2 IMPLANT
GLOVE INDICATOR 8.0 STRL GRN (GLOVE) ×2 IMPLANT
GOWN STRL REUS W/ TWL LRG LVL3 (GOWN DISPOSABLE) ×2 IMPLANT
GOWN STRL REUS W/TWL LRG LVL3 (GOWN DISPOSABLE) ×2
HANDLE YANKAUER SUCT BULB TIP (MISCELLANEOUS) ×2 IMPLANT
KIT TURNOVER KIT A (KITS) ×2 IMPLANT
LABEL OR SOLS (LABEL) ×2 IMPLANT
NDL MAYO CATGUT SZ5 (NEEDLE) ×1
NDL SUT 5 .5 CRC TPR PNT MAYO (NEEDLE) ×1 IMPLANT
NEEDLE FILTER BLUNT 18X 1/2SAF (NEEDLE) ×1
NEEDLE FILTER BLUNT 18X1 1/2 (NEEDLE) ×1 IMPLANT
NEEDLE HYPO 25X1 1.5 SAFETY (NEEDLE) ×6 IMPLANT
NS IRRIG 1000ML POUR BTL (IV SOLUTION) ×2 IMPLANT
NS IRRIG 500ML POUR BTL (IV SOLUTION) ×2 IMPLANT
PACK EXTREMITY ARMC (MISCELLANEOUS) ×2 IMPLANT
PAD CAST CTTN 4X4 STRL (SOFTGOODS) ×1 IMPLANT
PADDING CAST 4IN STRL (MISCELLANEOUS) ×1
PADDING CAST BLEND 4X4 STRL (MISCELLANEOUS) ×1 IMPLANT
PADDING CAST COTTON 4X4 STRL (SOFTGOODS) ×1
RASP SM TEAR CROSS CUT (RASP) ×2 IMPLANT
SPLINT CAST 1 STEP 5X30 WHT (MISCELLANEOUS) ×2 IMPLANT
SPLINT FAST PLASTER 5X30 (CAST SUPPLIES) ×1
SPLINT PLASTER CAST FAST 5X30 (CAST SUPPLIES) ×1 IMPLANT
SPONGE LAP 18X18 RF (DISPOSABLE) ×2 IMPLANT
STOCKINETTE M/LG 89821 (MISCELLANEOUS) ×2 IMPLANT
STRIP CLOSURE SKIN 1/2X4 (GAUZE/BANDAGES/DRESSINGS) ×2 IMPLANT
SUT MNCRL 3 0 RB1 (SUTURE) ×1 IMPLANT
SUT MNCRL+ 5-0 VIOLET P-3 (SUTURE) ×1 IMPLANT
SUT MONOCRYL 3 0 RB1 (SUTURE) ×1
SUT MONOCRYL 5-0 (SUTURE) ×1
SUT PDS AB 0 CT1 27 (SUTURE) IMPLANT
SUT PDS PLUS 0 (SUTURE) ×1
SUT PDS PLUS AB 0 CT-2 (SUTURE) ×1 IMPLANT
SUT VIC AB 0 SH 27 (SUTURE) ×2 IMPLANT
SUT VIC AB 2-0 SH 27 (SUTURE) ×3
SUT VIC AB 2-0 SH 27XBRD (SUTURE) ×3 IMPLANT
SUT VIC AB 3-0 SH 27 (SUTURE) ×2
SUT VIC AB 3-0 SH 27X BRD (SUTURE) ×2 IMPLANT
SUT VIC AB 4-0 FS2 27 (SUTURE) ×2 IMPLANT
SUT VICRYL AB 3-0 FS1 BRD 27IN (SUTURE) ×2 IMPLANT
SWABSTK COMLB BENZOIN TINCTURE (MISCELLANEOUS) ×2 IMPLANT
SYR 10ML LL (SYRINGE) ×4 IMPLANT
SYR 3ML LL SCALE MARK (SYRINGE) ×2 IMPLANT
WIRE MAGNUM (SUTURE) ×4 IMPLANT

## 2018-04-20 NOTE — Progress Notes (Signed)
States feeling better  Capillary refill positive to left foot

## 2018-04-20 NOTE — Anesthesia Preprocedure Evaluation (Addendum)
Anesthesia Evaluation  Patient identified by MRN, date of birth, ID band Patient awake    Reviewed: Allergy & Precautions, NPO status , Patient's Chart, lab work & pertinent test results, reviewed documented beta blocker date and time   Airway Mallampati: III  TM Distance: >3 FB     Dental  (+) Chipped   Pulmonary           Cardiovascular      Neuro/Psych Anxiety    GI/Hepatic   Endo/Other  Morbid obesity  Renal/GU      Musculoskeletal   Abdominal   Peds  Hematology   Anesthesia Other Findings   Reproductive/Obstetrics                            Anesthesia Physical Anesthesia Plan  ASA: III  Anesthesia Plan: General   Post-op Pain Management:    Induction: Intravenous  PONV Risk Score and Plan:   Airway Management Planned: Oral ETT  Additional Equipment:   Intra-op Plan:   Post-operative Plan:   Informed Consent: I have reviewed the patients History and Physical, chart, labs and discussed the procedure including the risks, benefits and alternatives for the proposed anesthesia with the patient or authorized representative who has indicated his/her understanding and acceptance.     Plan Discussed with: CRNA  Anesthesia Plan Comments:         Anesthesia Quick Evaluation

## 2018-04-20 NOTE — Anesthesia Postprocedure Evaluation (Signed)
Anesthesia Post Note  Patient: Melissa Martinez  Procedure(s) Performed: ACHILLES TENDON REPAIR-SECONDARY (Left Foot) OSTECTOMY-HAGLUNDS/RETROCALCANEAL (Left Foot)  Patient location during evaluation: PACU Anesthesia Type: General Level of consciousness: awake and alert Pain management: pain level controlled Vital Signs Assessment: post-procedure vital signs reviewed and stable Respiratory status: spontaneous breathing, nonlabored ventilation, respiratory function stable and patient connected to nasal cannula oxygen Cardiovascular status: blood pressure returned to baseline and stable Postop Assessment: no apparent nausea or vomiting Anesthetic complications: no     Last Vitals:  Vitals:   04/20/18 1233 04/20/18 1318  BP: (!) 142/87 140/85  Pulse: 91   Resp:    Temp:    SpO2: 98%     Last Pain:  Vitals:   04/20/18 1233  TempSrc:   PainSc: 5                  Jonan Seufert S

## 2018-04-20 NOTE — Op Note (Signed)
Operative note   Surgeon:Eloy Fehl [employer] forces logistics/support/administrative officerowler    Assistant: None    Preop diagnosis: 1.  Chronic Achilles tendinitis left lower leg 2.  Calcaneal exostosis left calcaneus    Postop diagnosis: Same    Procedure: 1 repair of chronic Achilles tendinitis 2.  Excision calcaneal exostosis left calcaneus    EBL: 10 mL's    Anesthesia:local and general.  Local consisted of a mixture of 0.25% bupivacaine and Exparel long-acting anesthetic.  This was a 1-2 mixture.  A total of 30 cc was used.    Hemostasis: Epinephrine infiltrated along the incision site    Specimen: Calcaneal bone spur and chronic Achilles tendinitis    Complications: None    Operative indications:Melissa Martinez is an 32 y.o. that presents today for surgical intervention.  The risks/benefits/alternatives/complications have been discussed and consent has been given.    Procedure:  Patient was brought into the OR and placed on the operating table in theprone position. After anesthesia was obtained theleft lower extremity was prepped and draped in usual sterile fashion.  Attention was directed to the posterior aspect of the calcaneus where longitudinal incision was made along the Achilles tendon at the calcaneal insertional site.  Sharp and blunt dissection carried down to the tendon.  The tendon was released on the distal aspect of the calcaneus and freed both medial and lateral.  A small Achilles tendon splitting incision was made at its superior insertional site.  At this time a large prominent bone spur was noted on the posterior aspect of the calcaneus.  With the use of a osteotome and power rasp I was able to remove all abnormal prominent bone spur.  This was taken down to good healthy bleeding bone.  Wound was flushed with copious amounts of irrigation.  There was noted to be some mild chronic intratendinous tendinosis and excision of this was performed sharply with 15 blade.  This was sent for pathological examination.  After further  debridement and repair of the Achilles tendon the longitudinal incision along the Achilles was repaired with an 0 PDS.  Next a 5.5 mm speed screw was placed into the calcaneus and the tendon was brought down into the speed screw with a #2 Magnum wire and a Krakw type suture.  Good stability was noted with pression against the bone.  The wound was flushed with copious amounts of irrigation.  Peritenon was closed with a 3-0 Vicryl and the subtenons tissue was closed with 3-0 Vicryl.  Skin was closed with a 5-0 Monocryl.  She was placed in a posterior splint with the foot in gravity equinus.    Patient tolerated the procedure and anesthesia well.  Was transported from the OR to the PACU with all vital signs stable and vascular status intact. To be discharged per routine protocol.  Will follow up in approximately 1 week in the outpatient clinic.

## 2018-04-20 NOTE — Anesthesia Procedure Notes (Signed)
Procedure Name: Intubation Date/Time: 04/20/2018 7:31 AM Performed by: Eben Burow, CRNA Pre-anesthesia Checklist: Patient identified, Emergency Drugs available, Suction available, Patient being monitored and Timeout performed Patient Re-evaluated:Patient Re-evaluated prior to induction Oxygen Delivery Method: Circle system utilized Preoxygenation: Pre-oxygenation with 100% oxygen Induction Type: IV induction Laryngoscope Size: Mac and 3 Grade View: Grade II Tube type: Oral Tube size: 7.5 mm Number of attempts: 1 Airway Equipment and Method: Stylet and LTA kit utilized Placement Confirmation: ETT inserted through vocal cords under direct vision,  positive ETCO2 and breath sounds checked- equal and bilateral Secured at: 21 cm Tube secured with: Tape Dental Injury: Teeth and Oropharynx as per pre-operative assessment

## 2018-04-20 NOTE — Transfer of Care (Signed)
Immediate Anesthesia Transfer of Care Note  Patient: Melissa Martinez  Procedure(s) Performed: ACHILLES TENDON REPAIR-SECONDARY (Left Foot) OSTECTOMY-HAGLUNDS/RETROCALCANEAL (Left Foot)  Patient Location: PACU  Anesthesia Type:General  Level of Consciousness: drowsy  Airway & Oxygen Therapy: Patient Spontanous Breathing and Patient connected to face mask oxygen  Post-op Assessment: Report given to RN and Post -op Vital signs reviewed and stable  Post vital signs: Reviewed and stable  Last Vitals:  Vitals Value Taken Time  BP 157/84 04/20/2018  9:25 AM  Temp 36.1 C 04/20/2018  9:25 AM  Pulse 102 04/20/2018  9:27 AM  Resp 12 04/20/2018  9:26 AM  SpO2 99 % 04/20/2018  9:27 AM  Vitals shown include unvalidated device data.  Last Pain:  Vitals:   04/20/18 0607  TempSrc: Temporal  PainSc: 0-No pain         Complications: No apparent anesthesia complications

## 2018-04-20 NOTE — OR Nursing (Signed)
Patient resting no vomiting at this time

## 2018-04-20 NOTE — Discharge Instructions (Signed)
[facility] °[facility] ° °POST OPERATIVE INSTRUCTIONS FOR DR. TROXLER AND DR. FOWLER °[facility] ° ° °1. Take your medication as prescribed.  Pain medication should be taken only as needed. ° °2. Keep the dressing clean, dry and intact. ° °3. Keep your foot elevated above the heart level for the first 48 hours. ° °4. We have instructed you to be non-weight bearing. ° °5. Always wear your post-op shoe when walking.  Always use your crutches if you are to be non-weight bearing. ° °6. Do not take a shower. Baths are permissible as long as the foot is kept out of the water.  ° °7. Every hour you are awake:  °- Bend your knee 15 times. ° °8. Call [facility] ([phone]) if any of the following problems occur: °- You develop a temperature or fever. °- The bandage becomes saturated with blood. °- Medication does not stop your pain. °- Injury of the foot occurs. °- Any symptoms of infection including redness, odor, or red streaks running from wound. ° °AMBULATORY SURGERY  °DISCHARGE INSTRUCTIONS ° ° °1) The drugs that you were given will stay in your system until tomorrow so for the next 24 hours you should not: ° °A) Drive an automobile °B) Make any legal decisions °C) Drink any alcoholic beverage ° ° °2) You may resume regular meals tomorrow.  Today it is better to start with liquids and gradually work up to solid foods. ° °You may eat anything you prefer, but it is better to start with liquids, then soup and crackers, and gradually work up to solid foods. ° ° °3) Please notify your doctor immediately if you have any unusual bleeding, trouble breathing, redness and pain at the surgery site, drainage, fever, or pain not relieved by medication. ° ° ° °4) Additional Instructions: ° ° ° ° ° ° ° °Please contact your physician with any problems or Same Day Surgery at [phone], Monday through Friday 6 am to 4 pm, or [place] [facility] [place] [facility] number at  [phone]. °

## 2018-04-20 NOTE — H&P (Signed)
HISTORY AND PHYSICAL INTERVAL NOTE:  04/20/2018  7:16 AM  Melissa Martinez  has presented today for surgery, with the diagnosis of Achilles tendinitis of lt lower extremity.  The various methods of treatment have been discussed with the patient.  No guarantees were given.  After consideration of risks, benefits and other options for treatment, the patient has consented to surgery.  I have reviewed the patients' chart and labs.    Patient Vitals for the past 24 hrs:  BP Temp Temp src Pulse Resp SpO2 Weight  04/20/18 0646 123/72 - - - - - -  04/20/18 [id number] (!) 154/102 (!) 97.2 F (36.2 C) Temporal 94 16 100 % 121.3 kg (267 lb 7 oz)    A history and physical examination was performed in my office.  The patient was reexamined.  There have been no changes to this history and physical examination.  Melissa Martinez, Melissa Martinez A

## 2018-04-20 NOTE — Anesthesia Post-op Follow-up Note (Signed)
Anesthesia QCDR form completed.        

## 2018-04-24 LAB — SURGICAL PATHOLOGY

## 2018-05-21 ENCOUNTER — Ambulatory Visit: Payer: Self-pay | Admitting: Medical

## 2018-05-22 DIAGNOSIS — M7662 Achilles tendinitis, left leg: Secondary | ICD-10-CM | POA: Diagnosis not present

## 2018-05-29 DIAGNOSIS — M7662 Achilles tendinitis, left leg: Secondary | ICD-10-CM | POA: Diagnosis not present

## 2018-05-29 DIAGNOSIS — F411 Generalized anxiety disorder: Secondary | ICD-10-CM | POA: Diagnosis not present

## 2018-05-29 DIAGNOSIS — E282 Polycystic ovarian syndrome: Secondary | ICD-10-CM | POA: Diagnosis not present

## 2018-05-29 DIAGNOSIS — E119 Type 2 diabetes mellitus without complications: Secondary | ICD-10-CM | POA: Diagnosis not present

## 2018-06-05 ENCOUNTER — Ambulatory Visit: Payer: Self-pay | Admitting: Medical

## 2018-06-05 ENCOUNTER — Encounter: Payer: Self-pay | Admitting: Medical

## 2018-06-05 VITALS — BP 142/91 | HR 93 | Temp 98.4°F | Resp 18 | Wt 265.8 lb

## 2018-06-05 DIAGNOSIS — J0100 Acute maxillary sinusitis, unspecified: Secondary | ICD-10-CM

## 2018-06-05 DIAGNOSIS — J029 Acute pharyngitis, unspecified: Secondary | ICD-10-CM

## 2018-06-05 MED ORDER — AMOXICILLIN-POT CLAVULANATE 875-125 MG PO TABS: 1.0000 | ORAL_TABLET | Freq: Two times a day (BID) | ORAL | 0 refills | Status: DC

## 2018-06-05 NOTE — Patient Instructions (Signed)
Pharyngitis Pharyngitis is a sore throat (pharynx). There is redness, pain, and swelling of your throat. Follow these instructions at home:  Drink enough fluids to keep your pee (urine) clear or pale yellow.  Only take medicine as told by your doctor. ? You may get sick again if you do not take medicine as told. Finish your medicines, even if you start to feel better. ? Do not take aspirin.  Rest.  Rinse your mouth (gargle) with salt water ( tsp of salt per 1 qt of water) every 1-2 hours. This will help the pain.  If you are not at risk for choking, you can suck on hard candy or sore throat lozenges. Contact a doctor if:  You have large, tender lumps on your neck.  You have a rash.  You cough up green, yellow-brown, or bloody spit. Get help right away if:  You have a stiff neck.  You drool or cannot swallow liquids.  You throw up (vomit) or are not able to keep medicine or liquids down.  You have very bad pain that does not go away with medicine.  You have problems breathing (not from a stuffy nose). This information is not intended to replace advice given to you by your health care provider. Make sure you discuss any questions you have with your health care provider. Document Released: 04/18/2008 Document Revised: 04/07/2016 Document Reviewed: 07/08/2013 Elsevier Interactive Patient Education  2017 Elsevier [employer]. Sinusitis, Adult Sinusitis is soreness and inflammation of your sinuses. Sinuses are hollow spaces in the bones around your face. They are located:  Around your eyes.  In the middle of your forehead.  Behind your nose.  In your cheekbones.  Your sinuses and nasal passages are lined with a stringy fluid (mucus). Mucus normally drains out of your sinuses. When your nasal tissues get inflamed or swollen, the mucus can get trapped or blocked so air cannot flow through your sinuses. This lets bacteria, viruses, and funguses grow, and that leads to  infection. Follow these instructions at home: Medicines  Take, use, or apply over-the-counter and prescription medicines only as told by your doctor. These may include nasal sprays.  If you were prescribed an antibiotic medicine, take it as told by your doctor. Do not stop taking the antibiotic even if you start to feel better. Hydrate and Humidify  Drink enough water to keep your pee (urine) clear or pale yellow.  Use a cool mist humidifier to keep the humidity level in your home above 50%.  Breathe in steam for 10-15 minutes, 3-4 times a day or as told by your doctor. You can do this in the bathroom while a hot shower is running.  Try not to spend time in cool or dry air. Rest  Rest as much as possible.  Sleep with your head raised (elevated).  Make sure to get enough sleep each night. General instructions  Put a warm, moist washcloth on your face 3-4 times a day or as told by your doctor. This will help with discomfort.  Wash your hands often with soap and water. If there is no soap and water, use hand sanitizer.  Do not smoke. Avoid being around people who are smoking (secondhand smoke).  Keep all follow-up visits as told by your doctor. This is important. Contact a doctor if:  You have a fever.  Your symptoms get worse.  Your symptoms do not get better within 10 days. Get help right away if:  You have a very bad   headache.  You cannot stop throwing up (vomiting).  You have pain or swelling around your face or eyes.  You have trouble seeing.  You feel confused.  Your neck is stiff.  You have trouble breathing. This information is not intended to replace advice given to you by your health care provider. Make sure you discuss any questions you have with your health care provider. Document Released: 04/18/2008 Document Revised: 06/26/2016 Document Reviewed: 08/26/2015 Elsevier Interactive Patient Education  2018 Elsevier Inc.  

## 2018-06-05 NOTE — Progress Notes (Signed)
   Subjective:    Patient ID: Melissa Martinez, female    DOB: 02/04/1986, 32 y.o.   MRN: [id number]  HPI 32 yo female in non acute distress. Presents today with complaints of  swollen tonsil on tht left side , post nasal drip green x 4 days, no headaches, fatigued, sore throat x 3 days.  Ear pain on ther left side. Denies fever or chills , cough or shortness of breath or chest pain.    Review of Systems  Constitutional: Positive for fatigue. Negative for chills and fever.  HENT: Positive for ear pain (left), postnasal drip, rhinorrhea (green), sneezing, sore throat and voice change ("froggy sounding"). Negative for congestion, hearing loss (muffled sounding), sinus pressure and sinus pain.   Eyes: Negative for discharge and itching.  Respiratory: Negative for cough and shortness of breath.   Cardiovascular: Negative for chest pain.  Gastrointestinal: Negative for abdominal pain, diarrhea, nausea and vomiting.  Genitourinary: Negative for dysuria.  Musculoskeletal: Negative for myalgias.  Skin: Negative for rash.  Allergic/Immunologic: Positive for environmental allergies.  Neurological: Negative for dizziness, syncope, light-headedness and headaches.  Hematological: Negative for adenopathy.  Psychiatric/Behavioral: Negative for behavioral problems, confusion, self-injury and suicidal ideas.      Objective:   Physical Exam  Constitutional: She is oriented to person, place, and time. She appears well-developed and well-nourished.  HENT:  Head: Normocephalic and atraumatic.  Right Ear: Hearing and ear canal normal. A middle ear effusion is present.  Left Ear: Hearing and ear canal normal. A middle ear effusion is present.  Nose: Mucosal edema (left side) and rhinorrhea present.  Mouth/Throat: Uvula is midline and mucous membranes are normal. Posterior oropharyngeal erythema (mild posterior pharynx) present. Tonsils are 0 on the right. Tonsils are 0 on the left.  Eyes: Pupils are equal,  round, and reactive to light. EOM are normal.  Neck: Normal range of motion. Neck supple.  Cardiovascular: Normal rate, regular rhythm and normal heart sounds.  Pulmonary/Chest: Effort normal and breath sounds normal.  Lymphadenopathy:    She has no cervical adenopathy.  Neurological: She is alert and oriented to person, place, and time.  Skin: Skin is warm and dry.  Psychiatric: She has a normal mood and affect. Her behavior is normal.  Nursing note and vitals reviewed.  Turbinate edema and erythema on the left side.  Mild erythema posteror oropharyngnx     Assessment & Plan:  Sinusitis Maxillary Pharyngitis Meds ordered this encounter  Medications  . amoxicillin-clavulanate (AUGMENTIN) 875-125 MG tablet    Sig: Take 1 tablet by mouth 2 (two) times daily.    Dispense:  20 tablet    Refill:  0   Return to clinic in  3-5 days if not improving. Patient verbalizes understanding and has no questions at discharge.

## 2018-06-07 DIAGNOSIS — M7662 Achilles tendinitis, left leg: Secondary | ICD-10-CM | POA: Diagnosis not present

## 2018-06-13 NOTE — Telephone Encounter (Signed)
This is an old request and was and error in Epic - did not route to in basket - both clinic providers were removed from [facility] refill pool without provider knowledge.

## 2018-06-21 DIAGNOSIS — M7662 Achilles tendinitis, left leg: Secondary | ICD-10-CM | POA: Diagnosis not present

## 2018-06-25 DIAGNOSIS — M62830 Muscle spasm of back: Secondary | ICD-10-CM | POA: Diagnosis not present

## 2018-06-25 DIAGNOSIS — M4602 Spinal enthesopathy, cervical region: Secondary | ICD-10-CM | POA: Diagnosis not present

## 2018-06-25 DIAGNOSIS — M9901 Segmental and somatic dysfunction of cervical region: Secondary | ICD-10-CM | POA: Diagnosis not present

## 2018-07-26 ENCOUNTER — Ambulatory Visit: Payer: Self-pay | Admitting: Adult Health

## 2018-07-26 ENCOUNTER — Encounter: Payer: Self-pay | Admitting: Adult Health

## 2018-07-26 VITALS — BP 142/84 | HR 90 | Temp 98.4°F | Resp 16 | Ht 68.0 in | Wt 262.0 lb

## 2018-07-26 DIAGNOSIS — J0101 Acute recurrent maxillary sinusitis: Secondary | ICD-10-CM

## 2018-07-26 DIAGNOSIS — H6501 Acute serous otitis media, right ear: Secondary | ICD-10-CM

## 2018-07-26 MED ORDER — AMOXICILLIN-POT CLAVULANATE 875-125 MG PO TABS
1.0000 | ORAL_TABLET | Freq: Two times a day (BID) | ORAL | 0 refills | Status: DC
Start: 2018-07-26 — End: 2018-09-27

## 2018-07-26 NOTE — Progress Notes (Signed)
Subjective:     Patient ID: Melissa Martinez, female   DOB: [birth year], 32 y.o.   MRN: [id number]  HPI   Patient is a 32 year old female in no acute distress who comes to the clinic with sinus congestion, maxillary sinus pressure. Ear pressure. Ear pain. Onset x 2 weeks. She is in clinical.   Toddler at home was sick one week ago.   Patient  denies any fever, body aches,chills, rash, chest pain, shortness of breath, nausea, vomiting, or diarrhea.   Achilles tendon left foot surgery June 7th healing well. Allergies  Allergen Reactions  . Levofloxacin Anaphylaxis, Itching and Other (See Comments)    Throat tightening, generalized itching, redness  . Reglan [Metoclopramide] Anxiety    Causes paranoia   . Other Other (See Comments)    splenda and equal- itching, hives  . Bactrim [Sulfamethoxazole-Trimethoprim] Itching    Also redness  . Equal [Aspartame] Itching  . Sulfa Antibiotics Other (See Comments)    Redness, hives, itching , skin felt on fire    Review of Systems  Constitutional: Positive for fatigue (mild ). Negative for activity change, appetite change, chills, diaphoresis, fever and unexpected weight change.  HENT: Positive for congestion, ear pain, postnasal drip, rhinorrhea, sinus pressure and sore throat. Negative for dental problem, drooling, ear discharge, facial swelling, hearing loss, mouth sores, nosebleeds, sinus pain, sneezing, tinnitus, trouble swallowing and voice change.   Eyes: Negative.   Respiratory: Negative.   Cardiovascular: Negative.   Gastrointestinal: Negative.   Endocrine: Negative.   Genitourinary: Negative.   Musculoskeletal: Negative.   Skin: Negative.   Allergic/Immunologic:        -- Levofloxacin -- Anaphylaxis, Itching and Other (See                          Comments)   --  Throat tightening, generalized itching, redness  -- Reglan (Metoclopramide) -- Anxiety   --  Causes paranoia  -- Other -- Other (See Comments)   --  splenda and equal-  itching, hives  -- Bactrim (Sulfamethoxazole-Trimethoprim) -- Itching   --  Also redness  -- Equal (Aspartame) -- Itching  -- Sulfa Antibiotics -- Other (See Comments)   --  Redness, hives, itching , skin felt on fire   Neurological: Negative.   Hematological: Negative.   Psychiatric/Behavioral: Negative.        Objective:   Physical Exam  Constitutional: She is oriented to person, place, and time. She appears well-developed and well-nourished. She is active.  Non-toxic appearance. She does not have a sickly appearance. She does not appear ill. No distress.  HENT:  Head: Normocephalic and atraumatic.  Right Ear: Hearing, external ear and ear canal normal. Tympanic membrane is erythematous. Tympanic membrane is not perforated. A middle ear effusion (darker yellow brown fluid behind tympanic membrane - right only ) is present.  Left Ear: Hearing, external ear and ear canal normal. Tympanic membrane is erythematous. Tympanic membrane is not perforated. A middle ear effusion is present.  Nose: Mucosal edema and rhinorrhea present. Right sinus exhibits maxillary sinus tenderness. Right sinus exhibits no frontal sinus tenderness. Left sinus exhibits maxillary sinus tenderness. Left sinus exhibits no frontal sinus tenderness.  Mouth/Throat: Uvula is midline, oropharynx is clear and moist and mucous membranes are normal. No oropharyngeal exudate.  Eyes: Pupils are equal, round, and reactive to light. Conjunctivae and EOM are normal. Right eye exhibits no discharge. Left eye exhibits no discharge. No scleral icterus.  Neck: Normal range of motion. Neck supple. No JVD present. No tracheal deviation present.  Cardiovascular: Normal rate, regular rhythm, normal heart sounds and intact distal pulses. Exam reveals no gallop and no friction rub.  No murmur heard. Pulmonary/Chest: Effort normal and breath sounds normal. No stridor. No respiratory distress. She has no wheezes. She has no rales. She exhibits  no tenderness.  Abdominal: Soft. Bowel sounds are normal.  Musculoskeletal: Normal range of motion.  Lymphadenopathy:       Head (right side): No submental, no submandibular, no tonsillar, no preauricular, no posterior auricular and no occipital adenopathy present.       Head (left side): No submental, no submandibular, no tonsillar, no preauricular, no posterior auricular and no occipital adenopathy present.    She has no cervical adenopathy.  Neurological: She is alert and oriented to person, place, and time. She exhibits normal muscle tone.  Skin: Skin is warm, dry and intact. Capillary refill takes less than 2 seconds. No rash noted. She is not diaphoretic. No erythema. No pallor.  Psychiatric: She has a normal mood and affect. Her speech is normal and behavior is normal. Judgment and thought content normal. Cognition and memory are normal.  Vitals reviewed.      Assessment:     Acute recurrent maxillary sinusitis  Non-recurrent acute serous otitis media of right ear      Plan:     Meds ordered this encounter  Medications  . amoxicillin-clavulanate (AUGMENTIN) 875-125 MG tablet    Sig: Take 1 tablet by mouth 2 (two) times daily.    Dispense:  20 tablet    Refill:  0   Take medication as above as directed. Follow up in 3 to 5 days if not improving. Recommend follow up with your already established Ear nose and throat physician within 30 days and if reoccurring symptoms or worsening symptoms. Given history of recurrent sinusitis and repeat ear infections you should follow up with Ear nose and throat.  Monitor blood pressure and follow up with primary care physician.  Advised patient call the office or your primary care doctor for an appointment if no improvement within 72 hours or if any symptoms change or worsen at any time  Advised ER or urgent Care if after hours or on weekend. Call 911 for emergency symptoms at any time.Patinet verbalized understanding of all instructions  given/reviewed and treatment plan and has no further questions or concerns at this time.    Patient verbalized understanding of all instructions given and denies any further questions at this time.

## 2018-07-26 NOTE — Patient Instructions (Signed)
Sinusitis, Adult Sinusitis is soreness and inflammation of your sinuses. Sinuses are hollow spaces in the bones around your face. They are located:  Around your eyes.  In the middle of your forehead.  Behind your nose.  In your cheekbones.  Your sinuses and nasal passages are lined with a stringy fluid (mucus). Mucus normally drains out of your sinuses. When your nasal tissues get inflamed or swollen, the mucus can get trapped or blocked so air cannot flow through your sinuses. This lets bacteria, viruses, and funguses grow, and that leads to infection. Follow these instructions at home: Medicines  Take, use, or apply over-the-counter and prescription medicines only as told by your doctor. These may include nasal sprays.  If you were prescribed an antibiotic medicine, take it as told by your doctor. Do not stop taking the antibiotic even if you start to feel better. Hydrate and Humidify  Drink enough water to keep your pee (urine) clear or pale yellow.  Use a cool mist humidifier to keep the humidity level in your home above 50%.  Breathe in steam for 10-15 minutes, 3-4 times a day or as told by your doctor. You can do this in the bathroom while a hot shower is running.  Try not to spend time in cool or dry air. Rest  Rest as much as possible.  Sleep with your head raised (elevated).  Make sure to get enough sleep each night. General instructions  Put a warm, moist washcloth on your face 3-4 times a day or as told by your doctor. This will help with discomfort.  Wash your hands often with soap and water. If there is no soap and water, use hand sanitizer.  Do not smoke. Avoid being around people who are smoking (secondhand smoke).  Keep all follow-up visits as told by your doctor. This is important. Contact a doctor if:  You have a fever.  Your symptoms get worse.  Your symptoms do not get better within 10 days. Get help right away if:  You have a very bad  headache.  You cannot stop throwing up (vomiting).  You have pain or swelling around your face or eyes.  You have trouble seeing.  You feel confused.  Your neck is stiff.  You have trouble breathing. This information is not intended to replace advice given to you by your health care provider. Make sure you discuss any questions you have with your health care provider. Document Released: 04/18/2008 Document Revised: 06/26/2016 Document Reviewed: 08/26/2015 Elsevier Interactive Patient Education  2018 [employer] Otitis Media, Adult Otitis media is redness, soreness, and puffiness (swelling) in the space just behind your eardrum (middle ear). It may be caused by allergies or infection. It often happens along with a cold. Follow these instructions at home:  Take your medicine as told. Finish it even if you start to feel better.  Only take over-the-counter or prescription medicines for pain, discomfort, or fever as told by your doctor.  Follow up with your doctor as told. Contact a doctor if:  You have otitis media only in one ear, or bleeding from your nose, or both.  You notice a lump on your neck.  You are not getting better in 3-5 days.  You feel worse instead of better. Get help right away if:  You have pain that is not helped with medicine.  You have puffiness, redness, or pain around your ear.  You get a stiff neck.  You cannot move part of your face (  paralysis).  You notice that the bone behind your ear hurts when you touch it. This information is not intended to replace advice given to you by your health care provider. Make sure you discuss any questions you have with your health care provider. Document Released: 04/18/2008 Document Revised: 04/07/2016 Document Reviewed: 05/28/2013 Elsevier Interactive Patient Education  2017 [employer] Inc.  

## 2018-08-06 DIAGNOSIS — M9902 Segmental and somatic dysfunction of thoracic region: Secondary | ICD-10-CM | POA: Diagnosis not present

## 2018-08-06 DIAGNOSIS — M546 Pain in thoracic spine: Secondary | ICD-10-CM | POA: Diagnosis not present

## 2018-08-06 DIAGNOSIS — M62830 Muscle spasm of back: Secondary | ICD-10-CM | POA: Diagnosis not present

## 2018-08-08 DIAGNOSIS — M546 Pain in thoracic spine: Secondary | ICD-10-CM | POA: Diagnosis not present

## 2018-08-08 DIAGNOSIS — M62830 Muscle spasm of back: Secondary | ICD-10-CM | POA: Diagnosis not present

## 2018-08-08 DIAGNOSIS — M9902 Segmental and somatic dysfunction of thoracic region: Secondary | ICD-10-CM | POA: Diagnosis not present

## 2018-08-13 DIAGNOSIS — M7662 Achilles tendinitis, left leg: Secondary | ICD-10-CM | POA: Diagnosis not present

## 2018-08-16 DIAGNOSIS — K648 Other hemorrhoids: Secondary | ICD-10-CM | POA: Diagnosis not present

## 2018-08-16 DIAGNOSIS — K644 Residual hemorrhoidal skin tags: Secondary | ICD-10-CM | POA: Diagnosis not present

## 2018-08-21 DIAGNOSIS — M62830 Muscle spasm of back: Secondary | ICD-10-CM | POA: Diagnosis not present

## 2018-08-21 DIAGNOSIS — M898X7 Other specified disorders of bone, ankle and foot: Secondary | ICD-10-CM | POA: Diagnosis not present

## 2018-08-21 DIAGNOSIS — M7662 Achilles tendinitis, left leg: Secondary | ICD-10-CM | POA: Diagnosis not present

## 2018-08-21 DIAGNOSIS — M9902 Segmental and somatic dysfunction of thoracic region: Secondary | ICD-10-CM | POA: Diagnosis not present

## 2018-08-21 DIAGNOSIS — M5127 Other intervertebral disc displacement, lumbosacral region: Secondary | ICD-10-CM | POA: Diagnosis not present

## 2018-08-21 DIAGNOSIS — M546 Pain in thoracic spine: Secondary | ICD-10-CM | POA: Diagnosis not present

## 2018-08-22 DIAGNOSIS — T781XXA Other adverse food reactions, not elsewhere classified, initial encounter: Secondary | ICD-10-CM | POA: Diagnosis not present

## 2018-08-23 ENCOUNTER — Encounter: Payer: Self-pay | Admitting: Adult Health

## 2018-08-23 ENCOUNTER — Encounter: Payer: Self-pay | Admitting: Urology

## 2018-08-23 ENCOUNTER — Ambulatory Visit: Payer: Self-pay | Admitting: Adult Health

## 2018-08-23 ENCOUNTER — Telehealth: Payer: Self-pay | Admitting: Urology

## 2018-08-23 ENCOUNTER — Ambulatory Visit (INDEPENDENT_AMBULATORY_CARE_PROVIDER_SITE_OTHER): Payer: BLUE CROSS/BLUE SHIELD | Admitting: Urology

## 2018-08-23 ENCOUNTER — Ambulatory Visit
Admission: RE | Admit: 2018-08-23 | Discharge: 2018-08-23 | Disposition: A | Discharge status: Home / Self Care | Payer: BLUE CROSS/BLUE SHIELD | Source: Ambulatory Visit | Attending: Urology | Admitting: Urology

## 2018-08-23 VITALS — BP 132/86 | HR 86 | Ht 68.0 in | Wt 257.1 lb

## 2018-08-23 VITALS — BP 140/78 | HR 93 | Temp 98.1°F | Resp 16 | Ht 68.0 in | Wt 256.0 lb

## 2018-08-23 DIAGNOSIS — R109 Unspecified abdominal pain: Secondary | ICD-10-CM

## 2018-08-23 DIAGNOSIS — N200 Calculus of kidney: Secondary | ICD-10-CM | POA: Diagnosis not present

## 2018-08-23 DIAGNOSIS — Z91018 Allergy to other foods: Secondary | ICD-10-CM

## 2018-08-23 DIAGNOSIS — J309 Allergic rhinitis, unspecified: Secondary | ICD-10-CM | POA: Insufficient documentation

## 2018-08-23 DIAGNOSIS — T50Z95A Adverse effect of other vaccines and biological substances, initial encounter: Secondary | ICD-10-CM | POA: Insufficient documentation

## 2018-08-23 LAB — URINALYSIS, COMPLETE
Bilirubin, UA: NEGATIVE
Glucose, UA: NEGATIVE
Leukocytes, UA: NEGATIVE
Nitrite, UA: NEGATIVE
Protein, UA: NEGATIVE
RBC, UA: NEGATIVE
Specific Gravity, UA: 1.02 (ref 1.005–1.030)
Urobilinogen, Ur: 0.2 mg/dL (ref 0.2–1.0)
pH, UA: 6.5 (ref 5.0–7.5)

## 2018-08-23 MED ORDER — FLUTICASONE PROPIONATE 50 MCG/ACT NA SUSP: 1.0000 | Freq: Every day | NASAL | 2 refills | Status: DC

## 2018-08-23 MED ORDER — TAMSULOSIN HCL 0.4 MG PO CAPS: 0.4000 mg | ORAL_CAPSULE | Freq: Every day | ORAL | 0 refills | Status: DC

## 2018-08-23 NOTE — Progress Notes (Signed)
Subjective:     Patient ID: Melissa Martinez, female   DOB: 12/08/1985, 32 y.o.   MRN: [id number]  HPI  Patient is a 32  Year old female in no acute distress who comes to the clinic for ear pain- left.  She was given flu shot on 08/22/18 . She reports she was feeling fine before flu shot and had no symptoms and woke up with body aches and sore left arm  where flu shot was given . She had MMR vaccine last week in opposite arm. She reports she did have mild flushing in her face when flu shot was given- denied any other symptoms. She did have vaccine with egg.  She has not taken any pain reliever for arm pain. She has tramadol for kidney stones but reports she has not used it yet.   Mild rhinorrhea cleat started today mild ear discomfort.  She was seen by urology for likely kidney stone - x ray pending - she was given Flomax and has not picked up yet.   Patient  denies any fever, rash, chest pain, shortness of breath, nausea, vomiting, or diarrhea.   ENT she saw yesterday - [place] and was told"  all was fine with ears and sinuses " per patient on 08/22/18. She is having allergy testing as she has found out she is allergic to almonds. She now has a Epi- Pen and knows how to use. She had lip swelling and took benadryl on 08/19/18. She did not have any respiratory symptoms with this.   She denies any swelling, rash, edema or voice changes now.  Patient  denies any fever, body aches,chills, rash, chest pain, shortness of breath, nausea, vomiting, or diarrhea.  Allergy testing scheduled per patient.   Zyrtec and Flonase taking regularly - she reports she took last of Flonase yesterday and wants a refill.   Review of Systems  Constitutional: Positive for fatigue. Negative for activity change, appetite change, chills, diaphoresis, fever and unexpected weight change.  HENT: Positive for rhinorrhea. Negative for congestion, dental problem, drooling, ear discharge, ear pain, facial swelling, hearing loss,  mouth sores, nosebleeds, postnasal drip, sinus pressure, sinus pain, sneezing, sore throat, tinnitus, trouble swallowing and voice change.   Respiratory: Negative.   Cardiovascular: Negative.   Gastrointestinal: Negative.   Genitourinary: Positive for dysuria, flank pain and urgency. Negative for decreased urine volume, difficulty urinating, dyspareunia, enuresis, frequency, genital sores, hematuria, menstrual problem, pelvic pain, vaginal bleeding, vaginal discharge and vaginal pain.       Seen urology prior to this visit - denies any changes since this visit.   Musculoskeletal: Positive for myalgias. Negative for arthralgias, back pain, gait problem, joint swelling, neck pain and neck stiffness.       Pain left arm at injection site " mild" flu vaccine was given 08/22/18 Generalized body aches started this morning.   Skin: Negative.   Allergic/Immunologic: Positive for environmental allergies and food allergies (almonds - new ).       Reports facial flushing with flu shot on 08/22/18 that resolved within 1 minute. Denies any other symptoms associated. NO throat swelling, tightness, itching, rash or any other allergic symptom. She did not take Benadryl at that time and reports she felt fine. Denies any cardiac or respiratory symptoms now or at time of immunization.   Hematological: Negative.        Objective:   Physical Exam  Constitutional: She is oriented to person, place, and time. She appears well-developed and well-nourished.  HENT:  Head: Normocephalic and atraumatic.  Right Ear: External ear normal. Tympanic membrane is not retracted. A middle ear effusion is present.  Left Ear: External ear normal. Tympanic membrane is not erythematous and not retracted.  No middle ear effusion.  Nose: Nose normal.  Mouth/Throat: Oropharynx is clear and moist. No oropharyngeal exudate.  Clear fluid   Eyes: Pupils are equal, round, and reactive to light. Conjunctivae and EOM are normal.  Neck: Normal  range of motion. Neck supple.  Cardiovascular: Normal rate, regular rhythm, normal heart sounds and intact distal pulses.  Pulmonary/Chest: Effort normal and breath sounds normal.  Abdominal: Soft. Normal aorta and bowel sounds are normal. There is no tenderness. There is CVA tenderness.  Bilateral " mild " CVA tenderness.  She seen urology 10/10 today and has pending x ray and follow up with urology. Was given Flomax and has not picked up yet.   Denies any abdominal pain.  Urine culture pending.   Musculoskeletal: Normal range of motion.       Left upper arm: She exhibits tenderness (very miuld tenderness with palpation at injection site from 08/22/18 - no erythema or drainage. no rash noted ). She exhibits no bony tenderness, no swelling, no edema, no deformity and no laceration.  Lymphadenopathy:       Head (right side): No submental, no submandibular, no tonsillar, no preauricular, no posterior auricular and no occipital adenopathy present.       Head (left side): No submental, no submandibular, no tonsillar, no preauricular, no posterior auricular and no occipital adenopathy present.    She has no cervical adenopathy.    She has no axillary adenopathy.       Right axillary: No pectoral adenopathy present.       Left axillary: No pectoral adenopathy present. Neurological: She is alert and oriented to person, place, and time.  Patient moves on and off of exam table and in room without difficulty. Gait is normal in hall and in room. Patient is oriented to person place time and situation. Patient answers questions appropriately and engages in conversation.   Skin: Skin is warm and dry. Capillary refill takes less than 2 seconds. No rash noted.  Psychiatric: She has a normal mood and affect. Her speech is normal and behavior is normal. Judgment and thought content normal. Cognition and memory are normal.  Vitals reviewed.      Assessment:    Allergic rhinitis, unspecified seasonality,  unspecified trigger  Vaccine reaction, initial encounter      Plan:     Mild fatigue and body aches - likely side effect from flu shot should resolve within 3 to 5 days and no symptoms should worsen.   If not taking Tramadol may use Motrin or Tylenol per package instructions for injection site pain. No pain or swelling noted in left arm. No erythema. Soreness is a normal side effect and if persists longer than three days or if any new symptoms develop.  Allergy testing scheduled per patient. She is to call her ENT today  and advise of flushing with flu shot and need for allergy testing eggs and any other recommendations. Advised to report reaction to ear nose and throat physician given history of recent allergic response to almonds can not rule out other allergies such as eggs or other components of flu shot.   Also informed to call her Donnamarie Rossetti, PA-C her primary care and inform of all conditions.   Follow instructions of urology regarding this  mornings visit. Return to urology if worsens.   May take Benadryl 50 mg per package instructions PRN bedtime- as will cause drowsiness.   Advised patient call the office or your primary care doctor for an appointment if no improvement within 72 hours or if any symptoms change or worsen at any time  Advised ER or urgent Care if after hours or on weekend. Call 911 for emergency symptoms at any time.Patinet verbalized understanding of all instructions given/reviewed and treatment plan and has no further questions or concerns at this time.    Patient verbalized understanding of all instructions given and denies any further questions at this time.

## 2018-08-23 NOTE — Patient Instructions (Addendum)
Allergies An allergy is when your body reacts to a substance in a way that is not normal. An allergic reaction can happen after you:  Eat something.  Breathe in something.  Touch something.  You can be allergic to:  Things that are only around during certain seasons, like molds and pollens.  Foods.  Drugs.  Insects.  Animal dander.  What are the signs or symptoms?  Puffiness (swelling). This may happen on the lips, face, tongue, mouth, or throat.  Sneezing.  Coughing.  Breathing loudly (wheezing).  Stuffy nose.  Tingling in the mouth.  A rash.  Itching.  Itchy, red, puffy areas of skin (hives).  Watery eyes.  Throwing up (vomiting).  Watery poop (diarrhea).  Dizziness.  Feeling faint or fainting.  Trouble breathing or swallowing.  A tight feeling in the chest.  A fast heartbeat. How is this diagnosed? Allergies can be diagnosed with:  A medical and family history.  Skin tests.  Blood tests.  A food diary. A food diary is a record of all the foods, drinks, and symptoms you have each day.  The results of an elimination diet. This diet involves making sure not to eat certain foods and then seeing what happens when you start eating them again.  How is this treated? There is no cure for allergies, but allergic reactions can be treated with medicine. Severe reactions usually need to be treated at a hospital. How is this prevented? The best way to prevent an allergic reaction is to avoid the thing you are allergic to. Allergy shots and medicines can also help prevent reactions in some cases. This information is not intended to replace advice given to you by your health care provider. Make sure you discuss any questions you have with your health care provider. Document Released: 02/25/2013 Document Revised: 06/27/2016 Document Reviewed: 08/12/2014 Elsevier Interactive Patient Education  2018 [employer].  Preventing Influenza, Adult Influenza,  more commonly known as "the flu," is a viral infection that mainly affects the respiratory tract. The respiratory tract includes structures that help you breathe, such as the lungs, nose, and throat. The flu causes many common cold symptoms, as well as a high fever and body aches. The flu spreads easily from person to person (is contagious). The flu is most common from December through March. This is called flu season.You can catch the flu virus by:  Breathing in droplets from an infected person's cough or sneeze.  Touching something that was recently contaminated with the virus and then touching your mouth, nose, or eyes.  What can I do to lower my risk? You can decrease your risk of getting the flu by:  Getting a flu shot (influenza vaccination) every year. This is the best way to prevent the flu. A flu shot is recommended for everyone age 44 months and older. ? It is best to get a flu shot in the fall, as soon as it is available. Getting a flu shot during winter or spring instead is still a good idea. Flu season can last into early spring. ? Preventing the flu through vaccination requires getting a new flu shot every year. This is because the flu virus changes slightly (mutates) from one year to the next. Even if a flu shot does not completely protect you from all flu virus mutations, it can reduce the severity of your illness and prevent dangerous complications of the flu. ? If you are pregnant, you can and should get a flu shot. ? If  you have had a reaction to the shot in the past or if you are allergic to eggs, check with your health care provider before getting a flu shot. ? Sometimes the vaccine is available as a nasal spray. In some years, the nasal spray has not been as effective against the flu virus. Check with your health care provider if you have questions about this.  Practicing good health habits. This is especially important during flu season. ? Avoid contact with people who are  sick with flu or cold symptoms. ? Wash your hands with soap and water often. If soap and water are not available, use hand sanitizer. ? Avoid touching your hands to your face, especially when you have not washed your hands recently. ? Use a disinfectant to clean surfaces at home and at work that may be contaminated with the flu virus. ? Keep your body's disease-fighting system (immune system) in good shape by eating a healthy diet, drinking plenty of fluids, getting enough sleep, and exercising regularly.  If you do get the flu, avoid spreading it to others by:  Staying home until your symptoms have been gone for at least one day.  Covering your mouth and nose with your elbow when you cough or sneeze.  Avoiding close contact with others, especially babies and elderly people.  Why are these changes important? Getting a flu shot and practicing good health habits protects you as well as other people. If you get the flu, your friends, family, and co-workers are also at risk of getting it, because it spreads so easily to others. Each year, about 2 out of every 10 people get the flu. Having the flu can lead to complications, such as pneumonia, ear infection, and sinus infection. The flu also can be deadly, especially for babies, people older than age 4, and people who have serious long-term diseases. How is this treated? Most people recover from the flu by resting at home and drinking plenty of fluids. However, a prescription antiviral medicine may reduce your flu symptoms and may make your flu go away sooner. This medicine must be started within a few days of getting flu symptoms. You can talk with your health care provider about whether you need an antiviral medicine. Antiviral medicine may be prescribed for people who are at risk for more serious flu symptoms. This includes people who:  Are older than age 8.  Are pregnant.  Have a condition that makes the flu worse or more dangerous.  Where  to find more information:  Centers for Disease Control and Prevention: tsavxtf.com  ItsBlog.fr: InternetEnthusiasts.hu  American Academy of Family Physicians: familydoctor.org/familydoctor/en/kids/vaccines/preventing-the-flu.html Contact a health care provider if:  You have influenza and you develop new symptoms.  You have: ? Chest pain. ? Diarrhea. ? A fever.  Your cough gets worse, or you produce more mucus. Summary  The best way to prevent the flu is to get a flu shot every year in the fall.  Even if you get the flu after you have received the yearly vaccine, your flu may be milder and go away sooner because of your flu shot.  If you get the flu, antiviral medicines that are started with a few days of symptoms may reduce your flu symptoms and may make your flu go away sooner.  You can also help prevent the flu by practicing good health habits. This information is not intended to replace advice given to you by your health care provider. Make sure you discuss any questions  you have with your health care provider. Document Released: 11/15/2015 Document Revised: 07/09/2016 Document Reviewed: 07/09/2016 Elsevier Interactive Patient Education  [employer].

## 2018-08-23 NOTE — Telephone Encounter (Signed)
Please let Melissa Martinez know that on the KUB it looks like she may have a left distal ureteral stone.  I would like for her to get a renal ultrasound as we have not had any upper tract imaging recently.

## 2018-08-23 NOTE — Progress Notes (Signed)
08/23/2018 8:58 AM   Melissa Martinez 12-10-1985 [id number]  Referring provider: [name], MD [address] [place], [place] [address]  Chief Complaint  Patient presents with  . Nephrolithiasis    HPI: Patient is a 32 year old Caucasian female with a history of nephrolithiasis who presents this morning requesting an urgent appointment for possible stone.   Had ureteral stent placed in 2014 for left ureteral stones.   Contrast CT in 05/2017 ordered by PCP revealed adrenal glands are unremarkable. Kidneys are normal, without renal calculi, focal lesion, or hydronephrosis.  Bladder is unremarkable.  KUB in 10/2017 was negative for stones.    She is having frequency, suprapubic pain, nocturia and bilateral flank pain L>R for two weeks.  Pain 6-7/10.  Pain is lasting for hours.  Pain feels like bad menstrual cramps.  Taking Tramadol for pain.  Nothing seems to make it worse.  Patient denies any gross hematuria, dysuria or suprapubic/flank pain.  Patient denies any fevers, chills, nausea or vomiting.  Her UA today is negative.    She denies pregnancy and is on OCP.   KUB taken today has a calcification in the left pelvis that may represent an ureteral stone.    PMH: Past Medical History:  Diagnosis Date  . Abdominal pain, generalized 11/11/2014  . Altered bowel function 11/11/2014  . Anxiety    tkes Prozac, for OCD related anxiety  . Bleeding per rectum 11/11/2014  . Calculus of kidney 12/14/2015  . Complication of anesthesia    sensitive / stops breathing  . Fatty infiltration of liver 11/11/2014  . PCOS (polycystic ovarian syndrome)   . Upper airway cough syndrome 08/17/2015   Followed in Pulmonary clinic/ [facility]/ [place]      Surgical History: Past Surgical History:  Procedure Laterality Date  . ACHILLES TENDON SURGERY Left 04/20/2018   Procedure: ACHILLES TENDON REPAIR-SECONDARY;  Surgeon: Gwyneth Revels, DPM;  Location: [facility] ORS;  Service: Podiatry;   Laterality: Left;  . CESAREAN SECTION N/A 06/22/2016   Procedure: CESAREAN SECTION;  Surgeon: [name], MD;  Location: [facility] OR[facility];  Service: Obstetrics;  Laterality: N/A;  . CHOLECYSTECTOMY    . COLONOSCOPY  09/2014  . DILATION AND CURETTAGE OF UTERUS  2013  . ESOPHAGOGASTRODUODENOSCOPY ENDOSCOPY    . EYE SURGERY     lasik  . hemmorhoidectomy  2012  . HYSTEROSCOPY  2015  . LAPAROSCOPIC CHOLECYSTECTOMY  06/2009  . OSTECTOMY Left 04/20/2018   Procedure: OSTECTOMY-HAGLUNDS/RETROCALCANEAL;  Surgeon: Gwyneth Revels, DPM;  Location: [facility] OR[facility];  Service: Podiatry;  Laterality: Left;  . STENT PLACE LEFT URETER ([facility] HX) Left 11/2012   has had 2 stones pass stones    Home Medications:  Allergies as of 08/23/2018      Reactions   Levofloxacin Anaphylaxis, Itching, Other (See Comments)   Throat tightening, generalized itching, redness   Reglan [metoclopramide] Anxiety   Causes paranoia    Other Other (See Comments)   splenda and equal- itching, hives   Bactrim [sulfamethoxazole-trimethoprim] Itching   Also redness   Equal [aspartame] Itching   Sulfa Antibiotics Other (See Comments)   Redness, hives, itching , skin felt on fire      Medication List        Accurate as of 08/23/18  8:58 AM. Always use your most recent med list.          amoxicillin-clavulanate 875-125 MG tablet Commonly known as:  AUGMENTIN Take 1 tablet by mouth 2 (two) times daily.   cetirizine  10 MG tablet Commonly known as:  ZYRTEC Take 10 mg by mouth daily.   drospirenone-ethinyl estradiol 3-0.02 MG tablet Commonly known as:  YAZ,GIANVI,LORYNA Take 1 tablet daily by mouth.   FLUoxetine 10 MG tablet Commonly known as:  PROZAC Take 20 mg by mouth daily.   fluticasone 50 MCG/ACT nasal spray Commonly known as:  FLONASE Place 1 spray into both nostrils daily.   KRILL OIL PO Take 1 capsule by mouth daily.   metFORMIN 500 MG 24 hr tablet Commonly known as:  GLUCOPHAGE-XR Take 500 mg by mouth daily  with breakfast.   tamsulosin 0.4 MG Caps capsule Commonly known as:  FLOMAX Take 1 capsule (0.4 mg total) by mouth daily.   traMADol 50 MG tablet Commonly known as:  ULTRAM Take 1 tablet by mouth at bedtime as needed.   Vitamin D3 2000 units capsule Take 4,000 Units by mouth daily.   WOMENS MULTIVITAMIN PO Take by mouth.       Allergies:  Allergies  Allergen Reactions  . Levofloxacin Anaphylaxis, Itching and Other (See Comments)    Throat tightening, generalized itching, redness  . Reglan [Metoclopramide] Anxiety    Causes paranoia   . Other Other (See Comments)    splenda and equal- itching, hives  . Bactrim [Sulfamethoxazole-Trimethoprim] Itching    Also redness  . Equal [Aspartame] Itching  . Sulfa Antibiotics Other (See Comments)    Redness, hives, itching , skin felt on fire    Family History: Family History  Problem Relation Age of Onset  . Heart disease Father   . Hyperlipidemia Father   . Breast cancer Maternal Grandmother   . Diabetes Maternal Grandmother   . Bladder Cancer Maternal Grandfather   . Heart disease Maternal Grandfather   . Kidney Stones Mother   . Hypertension Mother   . Hypertension Brother   . Kidney cancer Neg Hx     Social History:  reports that she has never smoked. She has never used smokeless tobacco. She reports that she drank alcohol. She reports that she does not use drugs.  ROS: UROLOGY Frequent Urination?: Yes Hard to postpone urination?: No Burning/pain with urination?: No Get up at night to urinate?: Yes Leakage of urine?: No Urine stream starts and stops?: No Trouble starting stream?: No Do you have to strain to urinate?: No Blood in urine?: No Urinary tract infection?: No Sexually transmitted disease?: No Injury to kidneys or bladder?: No Painful intercourse?: No Weak stream?: No Currently pregnant?: No Vaginal bleeding?: No Last menstrual period?: n  Gastrointestinal Nausea?: No Vomiting?:  No Indigestion/heartburn?: No Diarrhea?: No Constipation?: No  Constitutional Fever: No Night sweats?: Yes Weight loss?: No Fatigue?: Yes  Skin Skin rash/lesions?: No Itching?: No  Eyes Blurred vision?: No Double vision?: No  Ears/Nose/Throat Sore throat?: No Sinus problems?: No  Hematologic/Lymphatic Swollen glands?: No Easy bruising?: No  Cardiovascular Leg swelling?: No Chest pain?: No  Respiratory Cough?: No Shortness of breath?: No  Endocrine Excessive thirst?: No  Musculoskeletal Back pain?: No Joint pain?: No  Neurological Headaches?: No Dizziness?: No  Psychologic Depression?: No Anxiety?: No  Physical Exam: BP 132/86 (BP Location: Left Arm, Patient Position: Sitting, Cuff Size: Normal)   Pulse 86   Ht 5\' 8"  (1.727 m)   Wt 257 lb 1.6 oz (116.6 kg)   BMI 39.09 kg/m   Constitutional: Well nourished. Alert and oriented, No acute distress. HEENT: [place] AT, moist mucus membranes. Trachea midline, no masses. Cardiovascular: No clubbing, cyanosis, or edema. Respiratory: Normal  respiratory effort, no increased work of breathing. GI: Abdomen is soft, non tender, non distended, no abdominal masses.  Skin: No rashes, bruises or suspicious lesions. Lymph: No cervical or inguinal adenopathy. Neurologic: Grossly intact, no focal deficits, moving all 4 extremities. Psychiatric: Normal mood and affect.   Laboratory Data: Lab Results  Component Value Date   WBC 8.9 10/26/2017   HGB 12.6 10/26/2017   HCT 36.8 10/26/2017   MCV 82 10/26/2017   PLT 375 10/26/2017    Lab Results  Component Value Date   CREATININE 0.67 06/23/2017     Lab Results  Component Value Date   HGBA1C 5.6 05/30/2017    Urinalysis Negative.  See Epic.   I have reviewed the labs.   Pertinent Imaging: CLINICAL DATA:  Bilateral flank pain for several weeks. History of urinary calculi.  EXAM: ABDOMEN - 1 VIEW  COMPARISON:  10/31/2017 radiograph, 06/09/2017 CT  and prior studies  FINDINGS: No calcifications are identified overlying the renal shadows or expected course of the ureters to suggest radiopaque urinary calculi.  The bowel gas pattern is unremarkable.  No bony abnormalities are identified.  Cholecystectomy clips are present.  IMPRESSION: Negative.   Electronically Signed   By: Harmon Pier M.D.   On: 08/23/2018 14:18  I have independently reviewed the films and a calcification is present in the left pelvic area that may represent an ureteral stone.    Assessment & Plan:   1. Flank pain Urinalysis, Complete -will send for culture as she is having lower urinary tract symptoms Will not prescribe an antibiotic until cultures are available KUB report does not state any stones were visualized - will obtain a RUS Patient is advised that if they should start to experience pain that is not able to be controlled with pain medication, intractable nausea and/or vomiting and/or fevers greater than 103 or shaking chills to contact the office immediately or seek treatment in the emergency department for emergent intervention.    2. Urgency of urination UA negative - may be due to a distal ureteral stone Urine culture is pending  KUB is suggestive of a left distal ureteral stone  3. Hx of nephrolithiasis Spontaneous passage of punctate stones per patient Given a script for Flomax     Michiel Cowboy, [facility] [address], [address] [place], [place] [address] [phone]

## 2018-08-24 NOTE — Telephone Encounter (Signed)
Patient notified and will do the RUS. Order placed

## 2018-08-26 LAB — CULTURE, URINE COMPREHENSIVE

## 2018-08-30 ENCOUNTER — Ambulatory Visit: Payer: BLUE CROSS/BLUE SHIELD | Admitting: Urology

## 2018-08-31 DIAGNOSIS — L918 Other hypertrophic disorders of the skin: Secondary | ICD-10-CM | POA: Diagnosis not present

## 2018-08-31 DIAGNOSIS — M7662 Achilles tendinitis, left leg: Secondary | ICD-10-CM | POA: Diagnosis not present

## 2018-08-31 DIAGNOSIS — K644 Residual hemorrhoidal skin tags: Secondary | ICD-10-CM | POA: Diagnosis not present

## 2018-09-12 DIAGNOSIS — M5127 Other intervertebral disc displacement, lumbosacral region: Secondary | ICD-10-CM | POA: Diagnosis not present

## 2018-09-12 DIAGNOSIS — M62830 Muscle spasm of back: Secondary | ICD-10-CM | POA: Diagnosis not present

## 2018-09-12 DIAGNOSIS — M9902 Segmental and somatic dysfunction of thoracic region: Secondary | ICD-10-CM | POA: Diagnosis not present

## 2018-09-12 DIAGNOSIS — M546 Pain in thoracic spine: Secondary | ICD-10-CM | POA: Diagnosis not present

## 2018-09-13 DIAGNOSIS — M5127 Other intervertebral disc displacement, lumbosacral region: Secondary | ICD-10-CM | POA: Diagnosis not present

## 2018-09-13 DIAGNOSIS — M9902 Segmental and somatic dysfunction of thoracic region: Secondary | ICD-10-CM | POA: Diagnosis not present

## 2018-09-13 DIAGNOSIS — M546 Pain in thoracic spine: Secondary | ICD-10-CM | POA: Diagnosis not present

## 2018-09-13 DIAGNOSIS — M62830 Muscle spasm of back: Secondary | ICD-10-CM | POA: Diagnosis not present

## 2018-09-27 ENCOUNTER — Ambulatory Visit: Payer: Self-pay | Admitting: Adult Health

## 2018-09-27 ENCOUNTER — Encounter: Payer: Self-pay | Admitting: Adult Health

## 2018-09-27 VITALS — BP 155/96 | HR 80 | Temp 98.5°F | Resp 16 | Ht 68.0 in | Wt 251.0 lb

## 2018-09-27 DIAGNOSIS — J0101 Acute recurrent maxillary sinusitis: Secondary | ICD-10-CM

## 2018-09-27 DIAGNOSIS — H6983 Other specified disorders of Eustachian tube, bilateral: Secondary | ICD-10-CM

## 2018-09-27 DIAGNOSIS — H6504 Acute serous otitis media, recurrent, right ear: Secondary | ICD-10-CM

## 2018-09-27 MED ORDER — PREDNISONE 10 MG (21) PO TBPK: ORAL_TABLET | ORAL | 0 refills | Status: DC

## 2018-09-27 MED ORDER — AMOXICILLIN-POT CLAVULANATE ER 1000-62.5 MG PO TB12: 2.0000 | ORAL_TABLET | Freq: Two times a day (BID) | ORAL | 0 refills | Status: DC

## 2018-09-27 NOTE — Patient Instructions (Addendum)
Sinusitis, Adult Sinusitis is soreness and inflammation of your sinuses. Sinuses are hollow spaces in the bones around your face. They are located:  Around your eyes.  In the middle of your forehead.  Behind your nose.  In your cheekbones.  Your sinuses and nasal passages are lined with a stringy fluid (mucus). Mucus normally drains out of your sinuses. When your nasal tissues get inflamed or swollen, the mucus can get trapped or blocked so air cannot flow through your sinuses. This lets bacteria, viruses, and funguses grow, and that leads to infection. Follow these instructions at home: Medicines  Take, use, or apply over-the-counter and prescription medicines only as told by your doctor. These may include nasal sprays.  If you were prescribed an antibiotic medicine, take it as told by your doctor. Do not stop taking the antibiotic even if you start to feel better. Hydrate and Humidify  Drink enough water to keep your pee (urine) clear or pale yellow.  Use a cool mist humidifier to keep the humidity level in your home above 50%.  Breathe in steam for 10-15 minutes, 3-4 times a day or as told by your doctor. You can do this in the bathroom while a hot shower is running.  Try not to spend time in cool or dry air. Rest  Rest as much as possible.  Sleep with your head raised (elevated).  Make sure to get enough sleep each night. General instructions  Put a warm, moist washcloth on your face 3-4 times a day or as told by your doctor. This will help with discomfort.  Wash your hands often with soap and water. If there is no soap and water, use hand sanitizer.  Do not smoke. Avoid being around people who are smoking (secondhand smoke).  Keep all follow-up visits as told by your doctor. This is important. Contact a doctor if:  You have a fever.  Your symptoms get worse.  Your symptoms do not get better within 10 days. Get help right away if:  You have a very bad  headache.  You cannot stop throwing up (vomiting).  You have pain or swelling around your face or eyes.  You have trouble seeing.  You feel confused.  Your neck is stiff.  You have trouble breathing. This information is not intended to replace advice given to you by your health care provider. Make sure you discuss any questions you have with your health care provider. Document Released: 04/18/2008 Document Revised: 06/26/2016 Document Reviewed: 08/26/2015 Elsevier Interactive Patient Education  2018 [employer]  Sinus Rinse What is a sinus rinse? A sinus rinse is a home treatment. It rinses your sinuses with a mixture of salt and water (saline solution). Sinuses are air-filled spaces in your skull behind the bones of your face and forehead. They open into your nasal cavity. To do a sinus rinse, you will need:  Saline solution.  Neti pot or spray bottle. This releases the saline solution into your nose and through your sinuses. You can buy neti pots and spray bottles at: ? Your local pharmacy. ? A health food store. ? Online.  When should I do a sinus rinse? A sinus rinse can help to clear your nasal cavity. It can clear:  Mucus.  Dirt.  Dust.  Pollen.  You may do a sinus rinse when you have:  A cold.  A virus.  Allergies.  A sinus infection.  A stuffy nose.  If you are considering a sinus rinse:  Ask   child's doctor before doing a sinus rinse on your child.  Do not do a sinus rinse if you have had: ? Ear or nasal surgery. ? An ear infection. ? Blocked ears.  How do I do a sinus rinse?  Wash your hands.  Disinfect your device using the directions that came with the device.  Dry your device.  Use the solution that comes with your device or one that is sold separately in stores. Follow the mixing directions on the package.  Fill your device with the amount of saline solution as stated in the device instructions.  Stand over a sink and tilt  your head sideways over the sink.  Place the spout of the device in your upper nostril (the one closer to the ceiling).  Gently pour or squeeze the saline solution into the nasal cavity. The liquid should drain to the lower nostril if you are not too congested.  Gently blow your nose. Blowing too hard may cause ear pain.  Repeat in the other nostril.  Clean and rinse your device with clean water.  Air-dry your device. Are there risks of a sinus rinse? Sinus rinse is normally very safe and helpful. However, there are a few risks, which include:  A burning feeling in the sinuses. This may happen if you do not make the saline solution as instructed. Make sure to follow all directions when making the saline solution.  Infection from unclean water. This is rare, but possible.  Nasal irritation.  This information is not intended to replace advice given to you by your health care provider. Make sure you discuss any questions you have with your health care provider. Document Released: 05/28/2014 Document Revised: 09/27/2016 Document Reviewed: 03/18/2014 Elsevier Interactive Patient Education  2017 [employer]. Amoxicillin; Clavulanic Acid extended-release tablets What is this medicine? AMOXICILLIN; CLAVULANIC ACID (a mox i SILL in; KLAV yoo lan ic AS id) is a penicillin antibiotic. It is used to treat certain kinds of bacterial infections. It will not work for colds, flu, or other viral infections. This medicine may be used for other purposes; ask your health care provider or pharmacist if you have questions. COMMON BRAND NAME(S): Augmentin XR What should I tell my health care provider before I take this medicine? They need to know if you have any of these conditions: -bowel disease, like colitis -kidney disease -liver disease -mononucleosis -an unusual or allergic reaction to amoxicillin, penicillin, cephalosporin, other antibiotics, clavulanic acid, other medicines, foods, dyes, or  preservatives -pregnant or trying to get pregnant -breast-feeding How should I use this medicine? Take this medicine by mouth with a full glass of water. Follow the directions on the prescription label. Take at the start of a meal. Do not crush or chew. You may cut this medicine in half at the score line for easier swallowing. Take your medicine at regular intervals. Do not take your medicine more often than directed. Take all of your medicine as directed even if you think you are better. Do not skip doses or stop your medicine early. Contact your pediatrician or health care professional regarding the use of this medicine in children. This medicine has been used in children as young as 53 years of age. Overdosage: If you think you have taken too much of this medicine contact a poison control center or emergency room at once. NOTE: This medicine is only for you. Do not share this medicine with others. What if I miss a dose? If you miss a dose, take  it as soon as you can. If it is almost time for your next dose, take only that dose. Do not take double or extra doses. What may interact with this medicine? -allopurinol -anticoagulants -birth control pills -methotrexate -probenecid This list may not describe all possible interactions. Give your health care provider a list of all the medicines, herbs, non-prescription drugs, or dietary supplements you use. Also tell them if you smoke, drink alcohol, or use illegal drugs. Some items may interact with your medicine. What should I watch for while using this medicine? Tell your doctor or health care professional if your symptoms do not improve. Do not treat diarrhea with over the counter products. Contact your doctor if you have diarrhea that lasts more than 2 days or if it is severe and watery. If you have diabetes, you may get a false-positive result for sugar in your urine. Check with your doctor or health care professional. Birth control pills may not  work properly while you are taking this medicine. Talk to your doctor about using an extra method of birth control. What side effects may I notice from receiving this medicine? Side effects that you should report to your doctor or health care professional as soon as possible: -allergic reactions like skin rash, itching or hives, swelling of the face, lips, or tongue -breathing problems -dark urine -fever or chills, sore throat -redness, blistering, peeling or loosening of the skin, including inside the mouth -seizures -trouble passing urine or change in the amount of urine -unusual bleeding, bruising -unusually weak or tired -white patches or sores in the mouth or throat Side effects that usually do not require medical attention (report to your doctor or health care professional if they continue or are bothersome): -diarrhea -dizziness -headache -nausea, vomiting -stomach upset -vaginal or anal irritation This list may not describe all possible side effects. Call your doctor for medical advice about side effects. You may report side effects to FDA at 1-800-FDA-1088. Where should I keep my medicine? Keep out of the reach of children. Store at room temperature below 25 degrees C (77 degrees F). Keep container tightly closed. Throw away any unused medicine after the expiration date. NOTE: This sheet is a summary. It may not cover all possible information. If you have questions about this medicine, talk to your doctor, pharmacist, or health care provider.  2018 Elsevier/Gold Standard (2008-01-22 14:32:45) Otitis Media, Adult Otitis media is redness, soreness, and puffiness (swelling) in the space just behind your eardrum (middle ear). It may be caused by allergies or infection. It often happens along with a cold. Follow these instructions at home:  Take your medicine as told. Finish it even if you start to feel better.  Only take over-the-counter or prescription medicines for pain,  discomfort, or fever as told by your doctor.  Follow up with your doctor as told. Contact a doctor if:  You have otitis media only in one ear, or bleeding from your nose, or both.  You notice a lump on your neck.  You are not getting better in 3-5 days.  You feel worse instead of better. Get help right away if:  You have pain that is not helped with medicine.  You have puffiness, redness, or pain around your ear.  You get a stiff neck.  You cannot move part of your face (paralysis).  You notice that the bone behind your ear hurts when you touch it. This information is not intended to replace advice given to you by your  health care provider. Make sure you discuss any questions you have with your health care provider. Document Released: 04/18/2008 Document Revised: 04/07/2016 Document Reviewed: 05/28/2013 Elsevier Interactive Patient Education  2017 Else[employer] Inc.  Preventing MDRO Infections Multidrug-resistant organisms (MDRO) are bacteria that have become resistant to antibiotic medicines. This means that antibiotics cannot stop the bacteria from growing. Types of MDRO include:  Methicillin/oxacillin-resistant Staphylococcus aureus (MRSA).  Vancomycin-resistant enterococci (VRE).  Extended-spectrum beta-lactamases (ESBLs).  Clostridium difficile (C. Difficile).  Multi-drug resistant tuberculosis (MDR TB).  Penicillin-resistant Streptococcus pneumonia (PRSP).  Carbapenem-resistant enterobacteriaceae (CRE).  Everyone has good and bad bacteria in his or her body, such as in the stomach or on the skin. Good bacteria help protect the body from infection. However, when you take an antibiotic medicine, it may kill both the good and bad bacteria, which then allows medicine-resistant bacteria to grow. Infections caused by MDRO can be difficult to treat. It is important to follow certain safety measures to prevent the spread of MDRO. What increases the risk? You are more likely  to develop a MDRO infection if:  You were treated with an antibiotic medicine for a long time.  You have been in the hospital for a long time.  You recently had major surgery, such as chest or abdominal surgery.  You have a weakened disease-fighting (immune) system. This may be caused by an illness, long-term (chronic) condition, or medical treatment.  You have a catheter that has stayed in for a long time, such as a urinary catheter or vascular access device.  MDRO are usually spread through hands that have the germs (contaminated hands). MDRO may also be spread through:  Medical equipment that was not cleaned properly.  Shared personal items, such as razors or towels.  Contaminated surfaces, such as a bathroom counter or sink.  Undercooked or raw meat. Animals treated with antibiotics may have medicine-resistant bacteria.  Water or vegetables contaminated with animal feces.  How is this treated? MDRO infections are usually treated with antibiotic medicines that are taken by mouth (oral antibiotics). Treatment depends on the type of MDRO you have. MDRO infections are difficult to treat, and you may need to be hospitalized. Depending on how severe your infection is, you may need other treatments such as:  IV antibiotics.  High-dose antibiotics.  More than one antibiotic.  Antibiotics that you breathe in (inhaled antibiotics), if you have pneumonia.  A machine to help you breathe (ventilator).  What actions can be taken? What hospitals are doing:  Encouraging staff, patients, and visitors to wash hands often with soap and warm water, and to use hand sanitizer when soap and water are not available.  Taking extra steps to prevent infection (contact precautions) with patients who are infected with MDRO. Contact precautions include: ? Having all healthcare workers and visitors wash their hands before and after leaving the room. ? Having all healthcare workers and visitors wear  a gown and gloves while in the room, and asking them throw away the gown and gloves before leaving the room.  Prescribing antibiotic medicines only when they are needed. Over-prescribing antibiotic medicines can help the spread of MDRO.  Keeping patients with MDRO in a room by themselves (isolation) or placing them in rooms with other patients who are already infected with MDRO.  Carefully cleaning and disinfecting hospital rooms and equipment.  Improving communication about which patients are infected with MDRO or who have been infected in the past.  Closely monitoring and tracking MDRO infections.  Quarry manager  and patients about the signs of infection. What you can do:  Wash your hands regularly with soap and warm water. If soap and water are not available, use hand sanitizer.  Take antibiotic medicines only when needed. Do not take antibiotic medicines for viral infections such as the common cold.  If you were prescribed an antibiotic medicine, take it only as told by your health care provider. Do not stop taking the antibiotic even if you start to feel better.  Do not share antibiotic medicines with others.  Do not share personal items, such as bath towels or razors.  If you have a catheter, care for it as told by your health care provider.  Keep all wounds clean and dry. Follow your health care provider's instructions about how to care for any wounds you have.  Practice safe food handling. This includes: ? Washing all fruits and vegetables. ? Washing all utensils that have come in contact with raw meat. ? Keeping a separate cutting board for raw meat. ? Cooking meat thoroughly. All poultry (including chicken and [place]) should be cooked to at least 165F (74C). Ground beef, pork, or lamb should be cooked to at least 160F (71C), and whole beef, pork, or lamb should be cooked to at least 145F (63C). ? Washing your hands with soap and warm water before and after cooking,  especially after handling raw meat.  Clean and disinfect surfaces that are touched often. Use solutions or products that contain bleach. Do this on a regular basis. What visitors can do:  Although it is rare, visitors can be infected with MDRO. To prevent this, visitors should:  Wash their hands with soap and warm water before and after visiting you. If soap and water are not available, they can use hand sanitizer.  Ask your health care provider if they need to wear gloves and gowns when they visit you. If they do need to wear these, make sure they throw away the gloves and gowns before they leave your room.  Where to find more information: You can find more information about preventing MDRO infections from:  Centers for Disease Control and Prevention: http://www.weber-walters.com/  Summary  MDRO are bacteria that have become resistant to antibiotic medicines.  You are more likely to be infected with a MDRO if you have been taking an antibiotic medicine for a long time or have been hospitalized for a long time.  If you were prescribed an antibiotic medicine, take it exactly as told by your health care provider.  Wash your hands regularly with soap and warm water. If soap and water are not available, use hand sanitizer.  Ask your health care provider whether your visitors need to wear gloves and gowns when visiting you. This information is not intended to replace advice given to you by your health care provider. Make sure you discuss any questions you have with your health care provider. Document Released: 03/17/2017 Document Revised: 03/17/2017 Document Reviewed: 03/17/2017 Elsevier Interactive Patient Education  2018 [employer].

## 2018-09-27 NOTE — Progress Notes (Signed)
Subjective:     Patient ID: Melissa Martinez, female   DOB: 1986-09-04, 32 y.o.   MRN: [id number]  HPI   Blood pressure (!) 155/96, pulse 80, temperature 98.5 F (36.9 C), resp. rate 16, height 5\' 8"  (1.727 m), weight 251 lb (113.9 kg), SpO2 99 %.  Patient is a 32 year old female in no acute distress who comes to clinic for ear pain bilaterally. Sinus drainage- green.  Was off work yesterday - mild fatigue. Symptoms onset 2 weeks ago- worsening.   She states " I get this every four weeks or so for a while and I have not been using my Flonase as my husband picked it up and I think he lost it" She reports her pharmacy can let her have refill next week. She will get over the counter today as advised.   She reports she called her [facility] and they told her " she was fine if resolved with antibiotics " she reports they did not schedule her for a recheck. She reports she is going to have her primary care doctor give her a referral to another ENT and allergists.   Patient  denies any fever, body aches,chills, rash, chest pain, shortness of breath, nausea, vomiting, or diarrhea.   Denies ever having allergy to Augmentin. History of repeat sinusitis and repeat otitis media reoccurring frequently.  No LMP recorded. (Menstrual status: Oral contraceptives). LMP 09/20/18- Denies any chance of pregnancy or missed oral contraceptives.    Allergies  Allergen Reactions  . Levofloxacin Anaphylaxis, Itching and Other (See Comments)    Throat tightening, generalized itching, redness  . Reglan [Metoclopramide] Anxiety    Causes paranoia   . Other Other (See Comments)    splenda and equal- itching, hives Almonds hives  . Bactrim [Sulfamethoxazole-Trimethoprim] Itching    Also redness  . Equal [Aspartame] Itching  . Sulfa Antibiotics Other (See Comments)    Redness, hives, itching , skin felt on fire     Review of Systems  Constitutional: Positive for fatigue and fever (patient reports she feels she  may have had a fever last pm). Negative for activity change, appetite change, chills, diaphoresis and unexpected weight change.  HENT: Positive for congestion, ear pain, postnasal drip, rhinorrhea, sinus pressure and sore throat. Negative for dental problem, drooling, ear discharge, facial swelling, hearing loss, mouth sores, nosebleeds, sinus pain, sneezing, tinnitus, trouble swallowing and voice change.   Cardiovascular: Negative.   Gastrointestinal: Negative.   Genitourinary: Negative.   Musculoskeletal: Negative.   Skin: Negative.   Allergic/Immunologic: Positive for environmental allergies and food allergies. Negative for immunocompromised state.  Hematological: Negative.   Psychiatric/Behavioral: Negative.        Objective:   Physical Exam  Constitutional: She is oriented to person, place, and time. She appears well-developed and well-nourished. She is active.  Non-toxic appearance. She does not have a sickly appearance. She does not appear ill. No distress.  HENT:  Head: Normocephalic and atraumatic.  Right Ear: Hearing, external ear and ear canal normal. No drainage or swelling. Tympanic membrane is erythematous and bulging (moderate ). Tympanic membrane is not perforated and not retracted. A middle ear effusion (darker yellow brown fluid behind tympanic membrane ) is present.  Left Ear: Hearing, external ear and ear canal normal. No drainage or swelling. Tympanic membrane is bulging (mild ). Tympanic membrane is not perforated, not erythematous and not retracted. A middle ear effusion (clear fluid ) is present.  Nose: Mucosal edema and rhinorrhea present. No  nose lacerations or sinus tenderness. Right sinus exhibits maxillary sinus tenderness. Right sinus exhibits no frontal sinus tenderness. Left sinus exhibits maxillary sinus tenderness. Left sinus exhibits no frontal sinus tenderness.  Mouth/Throat: Uvula is midline, oropharynx is clear and moist and mucous membranes are normal. No  oropharyngeal exudate. No tonsillar exudate.  Eyes: Pupils are equal, round, and reactive to light. Conjunctivae and EOM are normal. Right eye exhibits no discharge. Left eye exhibits no discharge. No scleral icterus.  Neck: Trachea normal, normal range of motion, full passive range of motion without pain and phonation normal. Neck supple. No JVD present. No tracheal deviation present. No Brudzinski's sign noted.  Cardiovascular: Normal rate, regular rhythm, normal heart sounds and intact distal pulses. Exam reveals no gallop and no friction rub.  No murmur heard. Pulmonary/Chest: Effort normal and breath sounds normal. No stridor. No respiratory distress. She has no wheezes. She has no rales. She exhibits no tenderness.  Abdominal: Soft. Bowel sounds are normal.  Musculoskeletal: Normal range of motion.  Lymphadenopathy:       Head (right side): No submental, no submandibular, no tonsillar, no preauricular, no posterior auricular and no occipital adenopathy present.       Head (left side): No submental, no submandibular, no tonsillar, no preauricular, no posterior auricular and no occipital adenopathy present.    She has cervical adenopathy.       Right cervical: Superficial cervical (mild bilaterally ) adenopathy present.       Left cervical: Superficial cervical adenopathy present.    She has no axillary adenopathy.  Neurological: She is alert and oriented to person, place, and time. She exhibits normal muscle tone.  Skin: Skin is warm, dry and intact. Capillary refill takes less than 2 seconds. No rash noted. She is not diaphoretic. No erythema. No pallor.  Psychiatric: She has a normal mood and affect. Her speech is normal and behavior is normal. Judgment and thought content normal. Cognition and memory are normal.  Vitals reviewed.      Assessment:     Eustachian tube dysfunction, bilateral  Acute recurrent maxillary sinusitis  Recurrent acute serous otitis media of right ear       Plan:      Meds ordered this encounter  Medications  . amoxicillin-clavulanate (AUGMENTIN XR) 1000-62.5 MG 12 hr tablet    Sig: Take 2 tablets by mouth 2 (two) times daily.    Dispense:  40 tablet    Refill:  0  . predniSONE (STERAPRED UNI-PAK 21 TAB) 10 MG (21) TBPK tablet    Sig: PO: Take 6 tablets on day 1:Take 5 tablets day 2:Take 4 tablets day 3: Take 3 tablets day 4:Take 2 tablets day five: 5 Take 1 tablet day 6    Dispense:  21 tablet    Refill:  0   Call your PCP if unable to be seen by Ear nose and throat for further instructions. Record blood pressure and see PCP in one week sooner if over 140/90. Avoid sudafed products.   She is advised of possible differentials, sinus polyps, possible malignancy or bacterial resistance and she must be seen by Ear nose and throat as soon as possible.     Advised patient call the office or your primary care doctor for an appointment if no improvement within 72 hours or if any symptoms change or worsen at any time  Advised ER or urgent Care if after hours or on weekend. Call 911 for emergency symptoms at any time.Patinet verbalized understanding  of all instructions given/reviewed and treatment plan and has no further questions or concerns at this time.    Patient verbalized understanding of all instructions given and denies any further questions at this time.   Provider thoroughly discussed in collaboration above plan with supervising physician Dr. Julieanne Manson who is in agreement with the care plan as above.

## 2018-10-10 ENCOUNTER — Other Ambulatory Visit: Payer: Self-pay

## 2018-10-10 MED ORDER — DROSPIRENONE-ETHINYL ESTRADIOL 3-0.02 MG PO TABS: 1.0000 | ORAL_TABLET | Freq: Every day | ORAL | 0 refills | Status: DC

## 2018-10-10 NOTE — Telephone Encounter (Signed)
Pt scheduled AEX for 12/05/18 and needs refill of Gianvi sent to [facility] [address].  [phone]  Left detailed msg refill eRx'd to pharm.

## 2018-10-23 ENCOUNTER — Ambulatory Visit: Payer: Self-pay | Admitting: Medical

## 2018-10-23 ENCOUNTER — Encounter: Payer: Self-pay | Admitting: Medical

## 2018-10-23 VITALS — BP 148/98 | HR 93 | Temp 99.4°F | Resp 18 | Wt 253.6 lb

## 2018-10-23 DIAGNOSIS — J329 Chronic sinusitis, unspecified: Secondary | ICD-10-CM

## 2018-10-23 DIAGNOSIS — H60503 Unspecified acute noninfective otitis externa, bilateral: Secondary | ICD-10-CM

## 2018-10-23 DIAGNOSIS — B9789 Other viral agents as the cause of diseases classified elsewhere: Secondary | ICD-10-CM

## 2018-10-23 DIAGNOSIS — H6983 Other specified disorders of Eustachian tube, bilateral: Secondary | ICD-10-CM

## 2018-10-23 MED ORDER — NEOMYCIN-POLYMYXIN-HC 3.5-10000-1 OT SOLN: 3.0000 [drp] | Freq: Four times a day (QID) | OTIC | 0 refills | Status: DC

## 2018-10-23 NOTE — Progress Notes (Signed)
Subjective:    Patient ID: Melissa Martinez, female    DOB: [birth year], 32 y.o.   MRN: [id number]  HPI 32 yo female in non acute distress. Presents with complains beginning Migraine since last Sunday. Feels like lymph nodes have "flared up". Took Gabapentin for the occipital neuralgia.  Ear pain 2 days. No fever or chills. Complains that she has ear pain every 4 weeks. And has had previous infections.  Marland Kitchen PND in throat and some sneezing.   Ear pain from 09/27/18 after treatment with Augmentin and Prednisone seemed ot improve. Seen by ENT abouts 8 weeks  Has had a Migraine x 8 days, nausea, light sensitive and noise sensitve.  6-7/10 pain level.  Blood pressure (!) 148/98, pulse 93, temperature 99.4 F (37.4 C), temperature source Tympanic, resp. rate 18, weight 253 lb 9.6 oz (115 kg), SpO2 98 %. Allergies  Allergen Reactions  . Levofloxacin Anaphylaxis, Itching and Other (See Comments)    Throat tightening, generalized itching, redness  . Reglan [Metoclopramide] Anxiety    Causes paranoia   . Other Other (See Comments)    splenda and equal- itching, hives Almonds hives  . Bactrim [Sulfamethoxazole-Trimethoprim] Itching    Also redness  . Equal [Aspartame] Itching  . Sulfa Antibiotics Other (See Comments)    Redness, hives, itching , skin felt on fire    Review of Systems  Constitutional: Positive for diaphoresis. Negative for chills and fever.  HENT: Positive for ear pain (bilaterally), postnasal drip, sneezing, sore throat (scratchy throat) and voice change (cracking a little). Negative for congestion, ear discharge, hearing loss, rhinorrhea, sinus pressure, sinus pain and tinnitus.   Eyes: Negative for discharge and itching.  Respiratory: Positive for cough (ocassional today). Negative for chest tightness and wheezing.   Cardiovascular: Positive for palpitations ("I feel tachacardic at night "). Negative for chest pain and leg swelling.  Gastrointestinal: Negative for  abdominal pain.  Endocrine: Negative for polydipsia, polyphagia and polyuria.  Genitourinary: Negative for dysuria.  Musculoskeletal: Positive for neck pain (back of neck into shoulders). Negative for myalgias.  Skin: Negative for rash.  Allergic/Immunologic: Positive for environmental allergies and food allergies (almond milk splenda, equal , almond flour. tongue feels funny).  Neurological: Positive for dizziness (today), light-headedness (today) and headaches (behind eyes). Negative for syncope.  Hematological: Positive for adenopathy (back of head and under jaw).  Psychiatric/Behavioral: Negative for behavioral problems, confusion, self-injury and suicidal ideas.   Aunt with history of hard palate that eroded due to Afrin use, getting reconstructive surgery. Uncle history of sinus surgery but patient unsure of procedure.     Objective:   Physical Exam  Constitutional: She is oriented to person, place, and time. She appears well-developed and well-nourished.  HENT:  Head: Normocephalic and atraumatic.  Right Ear: Hearing and external ear normal. There is tenderness. A middle ear effusion is present.  Left Ear: Hearing and external ear normal. There is tenderness.  Nose: Mucosal edema and rhinorrhea (clear) present.  Mouth/Throat: Uvula is midline, oropharynx is clear and moist and mucous membranes are normal.  Eyes: Pupils are equal, round, and reactive to light. Conjunctivae and EOM are normal.  Neck: Normal range of motion. Neck supple.  Cardiovascular: Normal rate, regular rhythm and normal heart sounds.  Pulmonary/Chest: Effort normal and breath sounds normal.  Neurological: She is alert and oriented to person, place, and time.  Skin: Skin is warm and dry. Capillary refill takes less than 2 seconds.  Psychiatric: She has a normal mood and  affect. Her behavior is normal. Judgment and thought content normal.  Nursing note and vitals reviewed.         Assessment & Plan:   Viral sinusitis Eustachian tube dysfunction  Otitis Externa  L>.R stop using Q-tips in ears. Migraine Headache Meds ordered this encounter  Medications  . neomycin-polymyxin-hydrocortisone (CORTISPORIN) OTIC solution    Sig: Place 3 drops into both ears 4 (four) times daily.    Dispense:  10 mL    Refill:  0  to both ears. Patient would like a referral to [facility], she did not have a good experience with [facility]. She is to follow up with her doctor about her "Migraine headaches". She verbalizes understanding and has no questions at discharge.

## 2018-10-23 NOTE — Patient Instructions (Signed)
Viral Illness, Adult Viruses are tiny germs that can get into a person's body and cause illness. There are many different types of viruses, and they cause many types of illness. Viral illnesses can range from mild to severe. They can affect various parts of the body. Common illnesses that are caused by a virus include colds and the flu. Viral illnesses also include serious conditions such as HIV/AIDS (human immunodeficiency virus/acquired immunodeficiency syndrome). A few viruses have been linked to certain cancers. What are the causes? Many types of viruses can cause illness. Viruses invade cells in your body, multiply, and cause the infected cells to malfunction or die. When the cell dies, it releases more of the virus. When this happens, you develop symptoms of the illness, and the virus continues to spread to other cells. If the virus takes over the function of the cell, it can cause the cell to divide and grow out of control, as is the case when a virus causes cancer. Different viruses get into the body in different ways. You can get a virus by:  Swallowing food or water that is contaminated with the virus.  Breathing in droplets that have been coughed or sneezed into the air by an infected person.  Touching a surface that has been contaminated with the virus and then touching your eyes, nose, or mouth.  Being bitten by an insect or animal that carries the virus.  Having sexual contact with a person who is infected with the virus.  Being exposed to blood or fluids that contain the virus, either through an open cut or during a transfusion.  If a virus enters your body, your body's defense system (immune system) will try to fight the virus. You may be at higher risk for a viral illness if your immune system is weak. What are the signs or symptoms? Symptoms vary depending on the type of virus and the location of the cells that it invades. Common symptoms of the main types of viral illnesses  include: Cold and flu viruses  Fever.  Headache.  Sore throat.  Muscle aches.  Nasal congestion.  Cough. Digestive system (gastrointestinal) viruses  Fever.  Abdominal pain.  Nausea.  Diarrhea. Liver viruses (hepatitis)  Loss of appetite.  Tiredness.  Yellowing of the skin (jaundice). Brain and spinal cord viruses  Fever.  Headache.  Stiff neck.  Nausea and vomiting.  Confusion or sleepiness. Skin viruses  Warts.  Itching.  Rash. Sexually transmitted viruses  Discharge.  Swelling.  Redness.  Rash. How is this treated? Viruses can be difficult to treat because they live within cells. Antibiotic medicines do not treat viruses because these drugs do not get inside cells. Treatment for a viral illness may include:  Resting and drinking plenty of fluids.  Medicines to relieve symptoms. These can include over-the-counter medicine for pain and fever, medicines for cough or congestion, and medicines to relieve diarrhea.  Antiviral medicines. These drugs are available only for certain types of viruses. They may help reduce flu symptoms if taken early. There are also many antiviral medicines for hepatitis and HIV/AIDS.  Some viral illnesses can be prevented with vaccinations. A common example is the flu shot. Follow these instructions at home: Medicines   Take over-the-counter and prescription medicines only as told by your health care provider.  If you were prescribed an antiviral medicine, take it as told by your health care provider. Do not stop taking the medicine even if you start to feel better.  Be aware   of when antibiotics are needed and when they are not needed. Antibiotics do not treat viruses. If your health care provider thinks that you may have a bacterial infection as well as a viral infection, you may get an antibiotic. ? Do not ask for an antibiotic prescription if you have been diagnosed with a viral illness. That will not make your  illness go away faster. ? Frequently taking antibiotics when they are not needed can lead to antibiotic resistance. When this develops, the medicine no longer works against the bacteria that it normally fights. General instructions  Drink enough fluids to keep your urine clear or pale yellow.  Rest as much as possible.  Return to your normal activities as told by your health care provider. Ask your health care provider what activities are safe for you.  Keep all follow-up visits as told by your health care provider. This is important. How is this prevented? Take these actions to reduce your risk of viral infection:  Eat a healthy diet and get enough rest.  Wash your hands often with soap and water. This is especially important when you are in public places. If soap and water are not available, use hand sanitizer.  Avoid close contact with friends and family who have a viral illness.  If you travel to areas where viral gastrointestinal infection is common, avoid drinking water or eating raw food.  Keep your immunizations up to date. Get a flu shot every year as told by your health care provider.  Do not share toothbrushes, nail clippers, razors, or needles with other people.  Always practice safe sex.  Contact a health care provider if:  You have symptoms of a viral illness that do not go away.  Your symptoms come back after going away.  Your symptoms get worse. Get help right away if:  You have trouble breathing.  You have a severe headache or a stiff neck.  You have severe vomiting or abdominal pain. This information is not intended to replace advice given to you by your health care provider. Make sure you discuss any questions you have with your health care provider. Document Released: 03/11/2016 Document Revised: 04/13/2016 Document Reviewed: 03/11/2016 Elsevier Interactive Patient Education  2018 [employer]. Otitis Externa Otitis externa is an infection of the  outer ear canal. The outer ear canal is the area between the outside of the ear and the eardrum. Otitis externa is sometimes called "swimmer's ear." Follow these instructions at home:  If you were given antibiotic ear drops, use them as told by your doctor. Do not stop using them even if your condition gets better.  Take over-the-counter and prescription medicines only as told by your doctor.  Keep all follow-up visits as told by your doctor. This is important. How is this prevented?  Keep your ear dry. Use the corner of a towel to dry your ear after you swim or bathe.  Try not to scratch or put things in your ear. Doing these things makes it easier for germs to grow in your ear.  Avoid swimming in lakes, dirty water, or pools that may not have the right amount of a chemical called chlorine.  Consider making ear drops and putting 3 or 4 drops in each ear after you swim. Ask your doctor about how you can make ear drops. Contact a doctor if:  You have a fever.  After 3 days your ear is still red, swollen, or painful.  After 3 days you still have  pus coming from your ear.  Your redness, swelling, or pain gets worse.  You have a really bad headache.  You have redness, swelling, pain, or tenderness behind your ear. This information is not intended to replace advice given to you by your health care provider. Make sure you discuss any questions you have with your health care provider. Document Released: 04/18/2008 Document Revised: 11/26/2015 Document Reviewed: 08/10/2015 Elsevier Interactive Patient Education  2018 [employer] Eustachian Tube Dysfunction The eustachian tube connects the middle ear to the back of the nose. It regulates air pressure in the middle ear by allowing air to move between the ear and nose. It also helps to drain fluid from the middle ear space. When the eustachian tube does not function properly, air pressure, fluid, or both can build up in the middle  ear. Eustachian tube dysfunction can affect one or both ears. What are the causes? This condition happens when the eustachian tube becomes blocked or cannot open normally. This may result from:  Ear infections.  Colds and other upper respiratory infections.  Allergies.  Irritation, such as from cigarette smoke or acid from the stomach coming up into the esophagus (gastroesophageal reflux).  Sudden changes in air pressure, such as from descending in an airplane.  Abnormal growths in the nose or throat, such as nasal polyps, tumors, or enlarged tissue at the back of the throat (adenoids).  What increases the risk? This condition may be more likely to develop in people who smoke and people who are overweight. Eustachian tube dysfunction may also be more likely to develop in children, especially children who have:  Certain birth defects of the mouth, such as cleft palate.  Large tonsils and adenoids.  What are the signs or symptoms? Symptoms of this condition may include:  A feeling of fullness in the ear.  Ear pain.  Clicking or popping noises in the ear.  Ringing in the ear.  Hearing loss.  Loss of balance.  Symptoms may get worse when the air pressure around you changes, such as when you travel to an area of high elevation or fly on an airplane. How is this diagnosed? This condition may be diagnosed based on:  Your symptoms.  A physical exam of your ear, nose, and throat.  Tests, such as those that measure: ? The movement of your eardrum (tympanogram). ? Your hearing (audiometry).  How is this treated? Treatment depends on the cause and severity of your condition. If your symptoms are mild, you may be able to relieve your symptoms by moving air into ("popping") your ears. If you have symptoms of fluid in your ears, treatment may include:  Decongestants.  Antihistamines.  Nasal sprays or ear drops that contain medicines that reduce swelling (steroids).  In  some cases, you may need to have a procedure to drain the fluid in your eardrum (myringotomy). In this procedure, a small tube is placed in the eardrum to:  Drain the fluid.  Restore the air in the middle ear space.  Follow these instructions at home:  Take over-the-counter and prescription medicines only as told by your health care provider.  Use techniques to help pop your ears as recommended by your health care provider. These may include: ? Chewing gum. ? Yawning. ? Frequent, forceful swallowing. ? Closing your mouth, holding your nose closed, and gently blowing as if you are trying to blow air out of your nose.  Do not do any of the following until your health care provider  approves: ? Travel to high altitudes. ? Fly in airplanes. ? Work in a Estate agent or room. ? Scuba dive.  Keep your ears dry. Dry your ears completely after showering or bathing.  Do not smoke.  Keep all follow-up visits as told by your health care provider. This is important. Contact a health care provider if:  Your symptoms do not go away after treatment.  Your symptoms come back after treatment.  You are unable to pop your ears.  You have: ? A fever. ? Pain in your ear. ? Pain in your head or neck. ? Fluid draining from your ear.  Your hearing suddenly changes.  You become very dizzy.  You lose your balance. This information is not intended to replace advice given to you by your health care provider. Make sure you discuss any questions you have with your health care provider. Document Released: 11/27/2015 Document Revised: 04/07/2016 Document Reviewed: 11/19/2014 Elsevier Interactive Patient Education  [employer].

## 2018-10-27 DIAGNOSIS — J019 Acute sinusitis, unspecified: Secondary | ICD-10-CM | POA: Diagnosis not present

## 2018-11-27 DIAGNOSIS — E119 Type 2 diabetes mellitus without complications: Secondary | ICD-10-CM | POA: Diagnosis not present

## 2018-11-27 DIAGNOSIS — K760 Fatty (change of) liver, not elsewhere classified: Secondary | ICD-10-CM | POA: Diagnosis not present

## 2018-11-27 DIAGNOSIS — Z87442 Personal history of urinary calculi: Secondary | ICD-10-CM | POA: Diagnosis not present

## 2018-11-27 DIAGNOSIS — Z0000 Encounter for general adult medical examination without abnormal findings: Secondary | ICD-10-CM | POA: Diagnosis not present

## 2018-11-27 DIAGNOSIS — E282 Polycystic ovarian syndrome: Secondary | ICD-10-CM | POA: Diagnosis not present

## 2018-11-27 DIAGNOSIS — F411 Generalized anxiety disorder: Secondary | ICD-10-CM | POA: Diagnosis not present

## 2018-11-27 DIAGNOSIS — R51 Headache: Secondary | ICD-10-CM | POA: Diagnosis not present

## 2018-12-04 ENCOUNTER — Telehealth: Payer: Self-pay | Admitting: *Deleted

## 2018-12-04 DIAGNOSIS — M9902 Segmental and somatic dysfunction of thoracic region: Secondary | ICD-10-CM | POA: Diagnosis not present

## 2018-12-04 DIAGNOSIS — M9901 Segmental and somatic dysfunction of cervical region: Secondary | ICD-10-CM | POA: Diagnosis not present

## 2018-12-04 DIAGNOSIS — M546 Pain in thoracic spine: Secondary | ICD-10-CM | POA: Diagnosis not present

## 2018-12-04 DIAGNOSIS — M542 Cervicalgia: Secondary | ICD-10-CM | POA: Diagnosis not present

## 2018-12-04 NOTE — Telephone Encounter (Signed)
12/04/2018 1325- Called pt to ask about [facility]. Our clinic received notification that [facility] had attempted to call pt multiple times for an appt and calls were not answered or returned. Pt specifically asked at last visit to our clinic for a new ENT referral and [facility] was discussed and decided upon by pt and provider. Pt now states she had made an app[facility] but then cancelled and did not reschedule or return their calls. Pt elaborates, "[place], and it will probably take 3-4 visits before they know what's going on, that's a lot of driving and time off work. So I want to go to another doctor at [facility] other than Dr.Bennett. Can you make the referral for that?" Advised pt that because she is already a pt at [facility] she can call and make an appt with another provider of her choice in the practice. Pt states she will call [facility] and make appt. Murrell ReddenH. Ratcliffe PA-C informed of conversation with pt.

## 2018-12-05 ENCOUNTER — Ambulatory Visit (INDEPENDENT_AMBULATORY_CARE_PROVIDER_SITE_OTHER): Payer: BLUE CROSS/BLUE SHIELD | Admitting: Obstetrics & Gynecology

## 2018-12-05 ENCOUNTER — Encounter: Payer: Self-pay | Admitting: Obstetrics & Gynecology

## 2018-12-05 VITALS — BP 120/70 | Ht 68.0 in | Wt 250.0 lb

## 2018-12-05 DIAGNOSIS — Z01419 Encounter for gynecological examination (general) (routine) without abnormal findings: Secondary | ICD-10-CM

## 2018-12-05 DIAGNOSIS — E6609 Other obesity due to excess calories: Secondary | ICD-10-CM

## 2018-12-05 DIAGNOSIS — E282 Polycystic ovarian syndrome: Secondary | ICD-10-CM

## 2018-12-05 MED ORDER — DROSPIRENONE-ETHINYL ESTRADIOL 3-0.02 MG PO TABS: 1.0000 | ORAL_TABLET | Freq: Every day | ORAL | 3 refills | Status: DC

## 2018-12-05 NOTE — Progress Notes (Signed)
HPI:      [name] is a 33 y.o. [phone] who LMP was Patient's last menstrual period was 07/14/2018., as she has PCOS and takes OCPs every three months to avoid periods most months; she was due for one in Nov and had cramping but no period.  She now presents today for her annual examination. The patient has no complaints today other than weight gain after Achilles surgery last summer for which she is now losing on Keto diet and min exercise due to her Achilles still. The patient is sexually active. Her last pap: approximate date 2018 and was normal. The patient does perform self breast exams.  There is no notable family history of breast or ovarian cancer in her family.  The patient has regular exercise: yes.  The patient denies current symptoms of depression.    GYN History: Contraception: OCP (estrogen/progesterone)  PMHx: Past Medical History:  Diagnosis Date  . Abdominal pain, generalized 11/11/2014  . Altered bowel function 11/11/2014  . Anxiety    tkes Prozac, for OCD related anxiety  . Bleeding per rectum 11/11/2014  . Calculus of kidney 12/14/2015  . Complication of anesthesia    sensitive / stops breathing  . Fatty infiltration of liver 11/11/2014  . PCOS (polycystic ovarian syndrome)   . Upper airway cough syndrome 08/17/2015   Followed in Pulmonary clinic/ [facility]/ [place]     Past Surgical History:  Procedure Laterality Date  . ACHILLES TENDON SURGERY Left 04/20/2018   Procedure: ACHILLES TENDON REPAIR-SECONDARY;  Surgeon: Gwyneth Revels, DPM;  Location: [facility] ORS;  Service: Podiatry;  Laterality: Left;  . CESAREAN SECTION N/A 06/22/2016   Procedure: CESAREAN SECTION;  Surgeon: [name], MD;  Location: [facility] ORS;  Service: Obstetrics;  Laterality: N/A;  . CHOLECYSTECTOMY    . COLONOSCOPY  09/2014  . DILATION AND CURETTAGE OF UTERUS  2013  . ESOPHAGOGASTRODUODENOSCOPY ENDOSCOPY    . EYE SURGERY     lasik  . hemmorhoidectomy  2012  . HYSTEROSCOPY  2015   . LAPAROSCOPIC CHOLECYSTECTOMY  06/2009  . OSTECTOMY Left 04/20/2018   Procedure: OSTECTOMY-HAGLUNDS/RETROCALCANEAL;  Surgeon: Gwyneth Revels, DPM;  Location: [facility] ORS;  Service: Podiatry;  Laterality: Left;  . STENT PLACE LEFT URETER ([facility] HX) Left 11/2012   has had 2 stones pass stones   Family History  Problem Relation Age of Onset  . Heart disease Father   . Hyperlipidemia Father   . Breast cancer Maternal Grandmother   . Diabetes Maternal Grandmother   . Bladder Cancer Maternal Grandfather   . Heart disease Maternal Grandfather   . Kidney Stones Mother   . Hypertension Mother   . Hypertension Brother   . Kidney cancer Neg Hx    Social History   Tobacco Use  . Smoking status: Never Smoker  . Smokeless tobacco: Never Used  Substance Use Topics  . Alcohol use: Not Currently    Alcohol/week: 0.0 standard drinks  . Drug use: No    Current Outpatient Medications:  .  cetirizine (ZYRTEC) 10 MG tablet, Take 10 mg by mouth daily., Disp: , Rfl:  .  Cholecalciferol (VITAMIN D3) 2000 UNITS capsule, Take 4,000 Units by mouth daily. , Disp: , Rfl:  .  drospirenone-ethinyl estradiol (YAZ,GIANVI,LORYNA) 3-0.02 MG tablet, Take 1 tablet by mouth daily., Disp: 84 tablet, Rfl: 3 .  EPINEPHrine 0.3 mg/0.3 mL IJ SOAJ injection, INJECT INTRAMUSCULARLY IN THE EVENT OF AN EMERGENT ALLERGIC REACTION WITH DIFFICULTY BREATHING., Disp: , Rfl: 3 .  FLUoxetine (PROZAC) 10 MG tablet, Take 20 mg by mouth daily., Disp: , Rfl:  .  fluticasone (FLONASE) 50 MCG/ACT nasal spray, Place 1 spray into both nostrils daily., Disp: 16 g, Rfl: 2 .  gabapentin (NEURONTIN) 100 MG capsule, Take 200 mg by mouth 2 (two) times daily., Disp: , Rfl:  .  KRILL OIL PO, Take 1 capsule by mouth daily. , Disp: , Rfl:  .  metFORMIN (GLUCOPHAGE-XR) 500 MG 24 hr tablet, Take 500 mg by mouth daily with breakfast., Disp: , Rfl:  .  Multiple Vitamins-Minerals (WOMENS MULTIVITAMIN PO), Take by mouth., Disp: , Rfl:  .   neomycin-polymyxin-hydrocortisone (CORTISPORIN) OTIC solution, Place 3 drops into both ears 4 (four) times daily., Disp: 10 mL, Rfl: 0 .  PROCTOZONE-HC 2.5 % rectal cream, APPLY CREAM RECTALLY THREE TIMES DAILY, Disp: , Rfl: 0 .  traMADol (ULTRAM) 50 MG tablet, Take 1 tablet by mouth at bedtime as needed., Disp: , Rfl:  Allergies: Levofloxacin; Reglan [metoclopramide]; Other; Bactrim [sulfamethoxazole-trimethoprim]; Equal [aspartame]; and Sulfa antibiotics  Review of Systems  Constitutional: Negative for chills, fever and malaise/fatigue.  HENT: Negative for congestion, sinus pain and sore throat.   Eyes: Negative for blurred vision and pain.  Respiratory: Negative for cough and wheezing.   Cardiovascular: Negative for chest pain and leg swelling.  Gastrointestinal: Positive for diarrhea. Negative for abdominal pain, constipation, heartburn, nausea and vomiting.  Genitourinary: Negative for dysuria, frequency, hematuria and urgency.  Musculoskeletal: Negative for back pain, joint pain, myalgias and neck pain.  Skin: Negative for itching and rash.  Neurological: Negative for dizziness, tremors and weakness.  Endo/Heme/Allergies: Does not bruise/bleed easily.  Psychiatric/Behavioral: Negative for depression. The patient is not nervous/anxious and does not have insomnia.    Objective: BP 120/70   Ht 5\' 8"  (1.727 m)   Wt 250 lb (113.4 kg)   LMP 07/14/2018   BMI 38.01 kg/m   Filed Weights   12/05/18 0854  Weight: 250 lb (113.4 kg)   Body mass index is 38.01 kg/m. Physical Exam Constitutional:      General: She is not in acute distress.    Appearance: She is obese.  Genitourinary:     Pelvic exam was performed with patient supine.     Vagina, uterus and rectum normal.     No lesions in the vagina.     No vaginal bleeding.     No cervical motion tenderness, friability, lesion or polyp.     Uterus is mobile.     Uterus is not enlarged.     No uterine mass detected.    Uterus is  midaxial.     No right or left adnexal mass present.     Right adnexa not tender.     Left adnexa not tender.     Genitourinary Comments: Limited exam due to obesity  HENT:     Head: Normocephalic and atraumatic. No laceration.     Right Ear: Hearing normal.     Left Ear: Hearing normal.     Mouth/Throat:     Pharynx: Uvula midline.  Eyes:     Pupils: Pupils are equal, round, and reactive to light.  Neck:     Musculoskeletal: Normal range of motion and neck supple.     Thyroid: No thyromegaly.  Cardiovascular:     Rate and Rhythm: Normal rate and regular rhythm.     Heart sounds: No murmur. No friction rub. No gallop.   Pulmonary:     Effort: Pulmonary effort  is normal. No respiratory distress.     Breath sounds: Normal breath sounds. No wheezing.  Chest:     Breasts:        Right: No mass, skin change or tenderness.        Left: No mass, skin change or tenderness.  Abdominal:     General: Bowel sounds are normal. There is no distension.     Palpations: Abdomen is soft.     Tenderness: There is no abdominal tenderness. There is no rebound.  Musculoskeletal: Normal range of motion.  Neurological:     Mental Status: She is alert and oriented to person, place, and time.     Cranial Nerves: No cranial nerve deficit.  Skin:    General: Skin is warm and dry.  Psychiatric:        Judgment: Judgment normal.  Vitals signs reviewed.   Assessment:  ANNUAL EXAM 1. Women's annual routine gynecological examination   2. PCOS (polycystic ovarian syndrome)   3. Obesity due to excess calories, unspecified classification, unspecified whether serious comorbidity present    Screening Plan:            1.  Cervical Screening-  Pap smear schedule reviewed with patient  2. Breast screening- Exam annually and mammogram>40 planned   3.  Labs managed by PCP  4. Counseling for contraception: oral contraceptives (estrogen/progesterone)- continue 3 month period plan w pills.  5. Decreased  libido, likely related to extrinsic factors.  Has been on Prozac for years, recent increase in dose, may be a factor.  Unknown effects of Keto on libido.  Meds (newer therapies) discussed.  6. Obesity, cont diet, add in more exercise.  Meds such as Saxenda (prior DM, now controlled and not on Metformin) and Phentermine (prior use) discussed.    F/U  Return in about 1 year (around 12/06/2019) for Annual.  Annamarie MajorPaul Harris, MD, Merlinda FrederickFACOG [facility], [facility] 12/05/2018  9:39 AM

## 2018-12-14 ENCOUNTER — Telehealth: Payer: Self-pay

## 2018-12-14 DIAGNOSIS — J320 Chronic maxillary sinusitis: Secondary | ICD-10-CM | POA: Diagnosis not present

## 2018-12-14 DIAGNOSIS — J301 Allergic rhinitis due to pollen: Secondary | ICD-10-CM | POA: Diagnosis not present

## 2018-12-14 NOTE — Telephone Encounter (Signed)
Pt called requesting appointment for sinus congestion and ears clogged; phone note from 12/04/2018 read she was to follow up with ENT; pt denies follow up with ENT; discussed with H. Ratcliffe, PA, and her recommendation was to contact ENT for appointment; states the pt has been seen and treated for same symptoms multiple times,  and there may be a more complex underlying issues going on and that was the reason for the referral; pt states daughter has sinus infection; states she has had congestion for 2 days and is currently taking OTC meds, using nasal rinses, and  Has ear drops from previous appointment, temp 99.2;  Encouraged pt to continue current regimen and if s/s worsen or fever greater than 101 to go to urgent care, PCP, ED or contact clinic; pt verb u/o and not happy with recommendation

## 2018-12-26 DIAGNOSIS — J320 Chronic maxillary sinusitis: Secondary | ICD-10-CM | POA: Diagnosis not present

## 2018-12-26 DIAGNOSIS — J301 Allergic rhinitis due to pollen: Secondary | ICD-10-CM | POA: Diagnosis not present

## 2019-01-22 DIAGNOSIS — J301 Allergic rhinitis due to pollen: Secondary | ICD-10-CM | POA: Diagnosis not present

## 2019-01-23 DIAGNOSIS — M9902 Segmental and somatic dysfunction of thoracic region: Secondary | ICD-10-CM | POA: Diagnosis not present

## 2019-01-23 DIAGNOSIS — M542 Cervicalgia: Secondary | ICD-10-CM | POA: Diagnosis not present

## 2019-01-23 DIAGNOSIS — M546 Pain in thoracic spine: Secondary | ICD-10-CM | POA: Diagnosis not present

## 2019-01-23 DIAGNOSIS — M9901 Segmental and somatic dysfunction of cervical region: Secondary | ICD-10-CM | POA: Diagnosis not present

## 2019-02-20 DIAGNOSIS — J301 Allergic rhinitis due to pollen: Secondary | ICD-10-CM | POA: Diagnosis not present

## 2019-02-20 DIAGNOSIS — J019 Acute sinusitis, unspecified: Secondary | ICD-10-CM | POA: Diagnosis not present

## 2019-05-19 DIAGNOSIS — Z1159 Encounter for screening for other viral diseases: Secondary | ICD-10-CM | POA: Diagnosis not present

## 2019-05-19 DIAGNOSIS — B349 Viral infection, unspecified: Secondary | ICD-10-CM | POA: Diagnosis not present

## 2019-05-21 ENCOUNTER — Telehealth: Payer: Self-pay | Admitting: Medical

## 2019-05-21 DIAGNOSIS — B349 Viral infection, unspecified: Secondary | ICD-10-CM | POA: Diagnosis not present

## 2019-05-21 DIAGNOSIS — Z1159 Encounter for screening for other viral diseases: Secondary | ICD-10-CM | POA: Diagnosis not present

## 2019-05-21 NOTE — Telephone Encounter (Signed)
I called Melissa Martinez to see exactly what she needed and for whom.  She stated she had made a requested accomadation for Covid-19 and [employer] said she had to prove her medical condidtion.  I recommended she get the [place] form for [employer] Information Request Form and have her PCP complete the form. She was not aware of the form.  She will try to get the form and have her PCP complete the recomnedations.   She verbalizes understanding and then had one more question for me.  She stated she was waiting on a Covid-19 test and wanted to know when she needed to go to the Emergency Department.  I communicated to her if she is symptomatic, SOB, CP regardless of O2 saturation she should be examined again. She was seen by  the urgent care facility [facility], however they did not do an x-ray.  She does not feel like she needs to go to the Emergency Department then she should return to [facility], they have the capability of doing x-rays read immediately, possibly steroids and a nebulizer treatment. She again verbalizes understanding and has no more questions at the end of our conversation. She was thankful for my advice.  Return to the [facility] clinic as needed.

## 2019-05-23 ENCOUNTER — Other Ambulatory Visit: Payer: Self-pay

## 2019-05-23 DIAGNOSIS — Z20828 Contact with and (suspected) exposure to other viral communicable diseases: Secondary | ICD-10-CM | POA: Insufficient documentation

## 2019-05-23 DIAGNOSIS — R0789 Other chest pain: Secondary | ICD-10-CM | POA: Diagnosis not present

## 2019-05-23 DIAGNOSIS — J111 Influenza due to unidentified influenza virus with other respiratory manifestations: Secondary | ICD-10-CM | POA: Insufficient documentation

## 2019-05-23 DIAGNOSIS — R0602 Shortness of breath: Secondary | ICD-10-CM | POA: Diagnosis not present

## 2019-05-23 DIAGNOSIS — Z79899 Other long term (current) drug therapy: Secondary | ICD-10-CM | POA: Diagnosis not present

## 2019-05-23 DIAGNOSIS — M7918 Myalgia, other site: Secondary | ICD-10-CM | POA: Insufficient documentation

## 2019-05-23 DIAGNOSIS — Z7984 Long term (current) use of oral hypoglycemic drugs: Secondary | ICD-10-CM | POA: Diagnosis not present

## 2019-05-23 DIAGNOSIS — R51 Headache: Secondary | ICD-10-CM | POA: Insufficient documentation

## 2019-05-23 DIAGNOSIS — R05 Cough: Secondary | ICD-10-CM | POA: Diagnosis not present

## 2019-05-23 DIAGNOSIS — R079 Chest pain, unspecified: Secondary | ICD-10-CM | POA: Diagnosis not present

## 2019-05-23 NOTE — ED Triage Notes (Signed)
Patient c/o SOB, chest tightness, headache, body aches, weakness, chills, intermittent bluish coloration to toes, tingling in fingers. Patient has been tested for COVID at urgent care, results pending.

## 2019-05-24 ENCOUNTER — Emergency Department
Admission: EM | Admit: 2019-05-24 | Discharge: 2019-05-24 | Disposition: A | Discharge status: Home / Self Care | Payer: BC Managed Care – PPO | Attending: Emergency Medicine | Admitting: Emergency Medicine

## 2019-05-24 ENCOUNTER — Emergency Department: Payer: BC Managed Care – PPO

## 2019-05-24 DIAGNOSIS — R079 Chest pain, unspecified: Secondary | ICD-10-CM | POA: Diagnosis not present

## 2019-05-24 DIAGNOSIS — R0602 Shortness of breath: Secondary | ICD-10-CM

## 2019-05-24 DIAGNOSIS — J111 Influenza due to unidentified influenza virus with other respiratory manifestations: Secondary | ICD-10-CM

## 2019-05-24 LAB — BASIC METABOLIC PANEL
Anion gap: 11 (ref 5–15)
BUN: 12 mg/dL (ref 6–20)
CO2: 22 mmol/L (ref 22–32)
Calcium: 9.4 mg/dL (ref 8.9–10.3)
Chloride: 106 mmol/L (ref 98–111)
Creatinine, Ser: 0.58 mg/dL (ref 0.44–1.00)
GFR calc Af Amer: >60 mL/min (ref >60)
GFR calc non Af Amer: >60 mL/min (ref >60)
Glucose, Bld: 116 mg/dL — ABNORMAL HIGH (ref 70–99)
Potassium: 3.8 mmol/L (ref 3.5–5.1)
Sodium: 139 mmol/L (ref 135–145)

## 2019-05-24 LAB — CBC
HCT: 40.2 % (ref 36.0–46.0)
Hemoglobin: 13.3 g/dL (ref 12.0–15.0)
MCH: 28.1 pg (ref 26.0–34.0)
MCHC: 33.1 g/dL (ref 30.0–36.0)
MCV: 85 fL (ref 80.0–100.0)
Platelets: 359 10*3/uL (ref 150–400)
RBC: 4.73 MIL/uL (ref 3.87–5.11)
RDW: 12.6 % (ref 11.5–15.5)
WBC: 10 10*3/uL (ref 4.0–10.5)
nRBC: 0 % (ref 0.0–0.2)

## 2019-05-24 LAB — POCT PREGNANCY, URINE: Preg Test, Ur: NEGATIVE

## 2019-05-24 NOTE — Discharge Instructions (Signed)
As we discussed, we believe your symptoms are caused by a respiratory virus.  However, because we cannot rule out the possibility of COVID-19 at this time (until the send-out test results are back, check MyChart within about 2 business days), we recommend that you self-quarantine at home for 14 days, or until 3 consecutive days without fever (without taking medication to make your temperature come down, such as Tylenol (acetaminophen), after your respiratory symptoms have improved, and after at least 7 days have passed since your symptoms first appeared.  You should have as minimal contact as possible with anyone else including close family as per the [facility] paperwork guidelines listed below. Follow-up with your doctor by phone or online as needed and return immediately to the emergency department or call 911 only if you develop new or worsening symptoms that concern you.  You can find up-to-date information about COVID-19 in [place] by calling the [place] Coronavirus Helpline: [phone]. You may also call 2-1-1, or [phone], or additional resources.  You can also find information online at PureLoser.glhttps://www.ncdhhs.gov/divisions/public-health/coronavirus-disease-2019-covid-19-response-[place], or on the Center for Disease Control (CDC) website at http://bradshaw.com/https://www.cdc.gov/coronavirus/2019-ncov/index.html.     Person Under Monitoring Name: Melissa Martinez  [place] [address] [place] [id number]   Infection Prevention Recommendations for Individuals Confirmed to have, or Being Evaluated for, 2019 Novel Coronavirus (COVID-19) Infection Who Receive Care at Home  Individuals who are confirmed to have, or are being evaluated for, COVID-19 should follow the prevention steps below until a healthcare provider or local or state health department says they can return to normal activities.  Stay home except to get medical care You should restrict activities outside your home, except for  getting medical care. Do not go to work, school, or public areas, and do not use public transportation or taxis.  Call ahead before visiting your doctor Before your medical appointment, call the healthcare provider and tell them that you have, or are being evaluated for, COVID-19 infection. This will help the healthcare providers office take steps to keep other people from getting infected. Ask your healthcare provider to call the local or state health department.  Monitor your symptoms Seek prompt medical attention if your illness is worsening (e.g., difficulty breathing). Before going to your medical appointment, call the healthcare provider and tell them that you have, or are being evaluated for, COVID-19 infection. Ask your healthcare provider to call the local or state health department.  Wear a facemask You should wear a facemask that covers your nose and mouth when you are in the same room with other people and when you visit a healthcare provider. People who live with or visit you should also wear a facemask while they are in the same room with you.  Separate yourself from other people in your home As much as possible, you should stay in a different room from other people in your home. Also, you should use a separate bathroom, if available.  Avoid sharing household items You should not share dishes, drinking glasses, cups, eating utensils, towels, bedding, or other items with other people in your home. After using these items, you should wash them thoroughly with soap and water.  Cover your coughs and sneezes Cover your mouth and nose with a tissue when you cough or sneeze, or you can cough or sneeze into your sleeve. Throw used tissues in a lined trash can, and immediately wash your hands with soap and water for at least 20 seconds or use an alcohol-based hand rub.  Wash your [facility] your hands often and thoroughly with soap and water for at least 20 seconds. You can use  an alcohol-based hand sanitizer if soap and water are not available and if your hands are not visibly dirty. Avoid touching your eyes, nose, and mouth with unwashed hands.   Prevention Steps for Caregivers and Household Members of Individuals Confirmed to have, or Being Evaluated for, COVID-19 Infection Being Cared for in the Home  If you live with, or provide care at home for, a person confirmed to have, or being evaluated for, COVID-19 infection please follow these guidelines to prevent infection:  Follow healthcare providers instructions Make sure that you understand and can help the patient follow any healthcare provider instructions for all care.  Provide for the patients basic needs You should help the patient with basic needs in the home and provide support for getting groceries, prescriptions, and other personal needs.  Monitor the patients symptoms If they are getting sicker, call his or her medical provider and tell them that the patient has, or is being evaluated for, COVID-19 infection. This will help the healthcare providers office take steps to keep other people from getting infected. Ask the healthcare provider to call the local or state health department.  Limit the number of people who have contact with the patient If possible, have only one caregiver for the patient. Other household members should stay in another home or place of residence. If this is not possible, they should stay in another room, or be separated from the patient as much as possible. Use a separate bathroom, if available. Restrict visitors who do not have an essential need to be in the home.  Keep older adults, very young children, and other sick people away from the patient Keep older adults, very young children, and those who have compromised immune systems or chronic health conditions away from the patient. This includes people with chronic heart, lung, or kidney conditions, diabetes, and  cancer.  Ensure good ventilation Make sure that shared spaces in the home have good air flow, such as from an air conditioner or an opened window, weather permitting.  Wash your hands often Wash your hands often and thoroughly with soap and water for at least 20 seconds. You can use an alcohol based hand sanitizer if soap and water are not available and if your hands are not visibly dirty. Avoid touching your eyes, nose, and mouth with unwashed hands. Use disposable paper towels to dry your hands. If not available, use dedicated cloth towels and replace them when they become wet.  Wear a facemask and gloves Wear a disposable facemask at all times in the room and gloves when you touch or have contact with the patients blood, body fluids, and/or secretions or excretions, such as sweat, saliva, sputum, nasal mucus, vomit, urine, or feces.  Ensure the mask fits over your nose and mouth tightly, and do not touch it during use. Throw out disposable facemasks and gloves after using them. Do not reuse. Wash your hands immediately after removing your facemask and gloves. If your personal clothing becomes contaminated, carefully remove clothing and launder. Wash your hands after handling contaminated clothing. Place all used disposable facemasks, gloves, and other waste in a lined container before disposing them with other household waste. Remove gloves and wash your hands immediately after handling these items.  Do not share dishes, glasses, or other household items with the patient Avoid sharing household items. You should not share dishes, drinking  glasses, cups, eating utensils, towels, bedding, or other items with a patient who is confirmed to have, or being evaluated for, COVID-19 infection. After the person uses these items, you should wash them thoroughly with soap and water.  Wash laundry thoroughly Immediately remove and wash clothes or bedding that have blood, body fluids, and/or secretions  or excretions, such as sweat, saliva, sputum, nasal mucus, vomit, urine, or feces, on them. Wear gloves when handling laundry from the patient. Read and follow directions on labels of laundry or clothing items and detergent. In general, wash and dry with the warmest temperatures recommended on the label.  Clean all areas the individual has used often Clean all touchable surfaces, such as counters, tabletops, doorknobs, bathroom fixtures, toilets, phones, keyboards, tablets, and bedside tables, every day. Also, clean any surfaces that may have blood, body fluids, and/or secretions or excretions on them. Wear gloves when cleaning surfaces the patient has come in contact with. Use a diluted bleach solution (e.g., dilute bleach with 1 part bleach and 10 parts water) or a household disinfectant with a label that says EPA-registered for coronaviruses. To make a bleach solution at home, add 1 tablespoon of bleach to 1 quart (4 cups) of water. For a larger supply, add  cup of bleach to 1 gallon (16 cups) of water. Read labels of cleaning products and follow recommendations provided on product labels. Labels contain instructions for safe and effective use of the cleaning product including precautions you should take when applying the product, such as wearing gloves or eye protection and making sure you have good ventilation during use of the product. Remove gloves and wash hands immediately after cleaning.  Monitor yourself for signs and symptoms of illness Caregivers and household members are considered close contacts, should monitor their health, and will be asked to limit movement outside of the home to the extent possible. Follow the monitoring steps for close contacts listed on the symptom monitoring form.   ? If you have additional questions, contact your local health department or call the epidemiologist on call at [phone] (available 24/7). ? This guidance is subject to change. For the most  up-to-date guidance from [facility], please refer to their website: TripMetro.huhttps://www.cdc.gov/coronavirus/2019-ncov/hcp/guidance-prevent-spread.html

## 2019-05-24 NOTE — ED Provider Notes (Signed)
[facility] Emergency Department Provider Note  ____________________________________________   First MD Initiated Contact with Patient 05/24/19 [phone]     (approximate)  I have reviewed the triage vital signs and the nursing notes.   HISTORY  Chief Complaint Shortness of Breath and Generalized Body Aches    HPI Melissa Martinez is a 33 y.o. 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  She presents for persistent symptoms for about a week that include some shortness of breath, mild cough, occasional chest tightness, body aches, headache, generalized weakness, intermittent chills.  She reports the symptoms wax and wane in severity, are severe at times, nothing particular makes it better or worse.  She has been to a [facility] urgent care multiple times and had a send out COVID-19 test but does not have the results.  She reportedly had a clear chest x-ray.  She came in tonight because she was concerned her toes were turning blue in her home pulse oximeter seem to be getting low.  She is able speak clearly and does not have a sore throat.  She denies abdominal pain no dysuria.  She has not had any contact with known COVID-19 patients of which she is aware.         Past Medical History:  Diagnosis Date  . Abdominal pain, generalized 11/11/2014  . Altered bowel function 11/11/2014  . Anxiety    tkes Prozac, for OCD related anxiety  . Bleeding per rectum 11/11/2014  . Calculus of kidney 12/14/2015  . Complication of anesthesia    sensitive / stops breathing  . Fatty infiltration of liver 11/11/2014  . PCOS (polycystic ovarian syndrome)   . Upper airway cough syndrome 08/17/2015   Followed in Pulmonary clinic/ [facility]/ [place]      Patient Active Problem List   Diagnosis Date Noted  . Allergy to almonds 08/23/2018  . Vaccine reaction, initial encounter 08/23/2018  .  Allergic rhinitis 08/23/2018  . Chronic neck pain 04/16/2018  . Headache disorder 04/16/2018  . Asymptomatic hyperuricemia 02/05/2018  . Chronic pain of left heel 02/05/2018  . Screening for cervical cancer 08/01/2017  . Nausea 07/03/2017  . Dehydration 06/12/2017  . Adenitis 05/23/2017  . Adenitis 05/23/2017  . Closed Colles' fracture of left radius 10/17/2016  . Kidney stones 12/14/2015  . PCOS (polycystic ovarian syndrome) 12/14/2015  . Upper airway cough syndrome 08/17/2015  . Upper airway cough syndrome 08/17/2015  . Abdominal pain, chronic, generalized 11/11/2014  . Bleeding per rectum 11/11/2014  . Change in bowel habits 11/11/2014  . Fatty infiltration of liver 11/11/2014  . Bright red rectal bleeding 11/11/2014    Past Surgical History:  Procedure Laterality Date  . ACHILLES TENDON SURGERY Left 04/20/2018   Procedure: ACHILLES TENDON REPAIR-SECONDARY;  Surgeon: Gwyneth RevelsFowler, Justin, DPM;  Location: [facility] ORS;  Service: Podiatry;  Laterality: Left;  . CESAREAN SECTION N/A 06/22/2016   Procedure: CESAREAN SECTION;  Surgeon: Nadara Mustardobert P Harris, MD;  Location: [facility] ORS;  Service: Obstetrics;  Laterality: N/A;  . CHOLECYSTECTOMY    . COLONOSCOPY  09/2014  . DILATION AND CURETTAGE OF UTERUS  2013  . ESOPHAGOGASTRODUODENOSCOPY ENDOSCOPY    . EYE SURGERY     lasik  . hemmorhoidectomy  2012  . HYSTEROSCOPY  2015  . LAPAROSCOPIC CHOLECYSTECTOMY  06/2009  . OSTECTOMY Left 04/20/2018   Procedure: OSTECTOMY-HAGLUNDS/RETROCALCANEAL;  Surgeon: Gwyneth RevelsFowler, Justin, DPM;  [facility];  Service: Podiatry;  Laterality: Left;  . STENT PLACE LEFT URETER ([facility] HX) Left 11/2012   has had 2 stones pass stones    Prior to Admission medications   Medication Sig Start Date End Date Taking? Authorizing Provider  cetirizine (ZYRTEC) 10 MG tablet Take 10 mg by mouth daily.    [provider]  Cholecalciferol (VITAMIN D3) 2000 UNITS capsule Take 4,000 Units by mouth daily.     [provider]  drospirenone-ethinyl estradiol (YAZ,GIANVI,LORYNA) 3-0.02 MG tablet Take 1 tablet by mouth daily. 12/05/18   Nadara MustardHarris, Robert P, MD  EPINEPHrine 0.3 mg/0.3 mL IJ SOAJ injection INJECT INTRAMUSCULARLY IN THE EVENT OF AN EMERGENT ALLERGIC REACTION WITH DIFFICULTY BREATHING. 08/22/18   [provider]  FLUoxetine (PROZAC) 10 MG tablet Take 20 mg by mouth daily.    [provider]  fluticasone (FLONASE) 50 MCG/ACT nasal spray Place 1 spray into both nostrils daily. 08/23/18   Flinchum, Eula FriedMichelle S, FNP  gabapentin (NEURONTIN) 100 MG capsule Take 200 mg by mouth 2 (two) times daily.    [provider]  KRILL OIL PO Take 1 capsule by mouth daily.     [provider]  metFORMIN (GLUCOPHAGE-XR) 500 MG 24 hr tablet Take 500 mg by mouth daily with breakfast.    [provider]  Multiple Vitamins-Minerals (WOMENS MULTIVITAMIN PO) Take by mouth.    [provider]  neomycin-polymyxin-hydrocortisone (CORTISPORIN) OTIC solution Place 3 drops into both ears 4 (four) times daily. 10/23/18   Ratcliffe, Heather R, PA-C  PROCTOZONE-HC 2.5 % rectal cream APPLY CREAM RECTALLY THREE TIMES DAILY 09/05/18   [provider]  traMADol (ULTRAM) 50 MG tablet Take 1 tablet by mouth at bedtime as needed. 05/23/18   [provider]    Allergies Levofloxacin, Reglan [metoclopramide], Other, Bactrim [sulfamethoxazole-trimethoprim], Equal [aspartame], and Sulfa antibiotics  Family History  Problem Relation Age of Onset  . Heart disease Father   . Hyperlipidemia Father   . Breast cancer Maternal Grandmother   . Diabetes Maternal Grandmother   . Bladder Cancer Maternal Grandfather   . Heart disease Maternal Grandfather   . Kidney Stones Mother   . Hypertension Mother   . Hypertension Brother   . Kidney cancer Neg Hx     Social History Social History   Tobacco Use  . Smoking status: Never Smoker  . Smokeless tobacco: Never Used   Substance Use Topics  . Alcohol use: Not Currently    Alcohol/week: 0.0 standard drinks  . Drug use: No    Review of Systems Constitutional: Chills, general malaise, myalgias Eyes: No visual changes. ENT: No sore throat. Cardiovascular: Denies chest pain. Respiratory: +shortness of breath and cough Gastrointestinal: No abdominal pain.  No nausea, no vomiting.  No diarrhea.  No constipation. Genitourinary: Negative for dysuria. Musculoskeletal: Myalgias, negative for neck pain.  Negative for back pain. Integumentary: Negative for rash. Neurological: Negative for headaches, focal weakness or numbness.   ____________________________________________   PHYSICAL EXAM:  VITAL SIGNS: ED Triage Vitals  Enc Vitals Group     BP 05/23/19 2343 (!) 142/66     Pulse Rate 05/23/19 2343 85     Resp 05/23/19 2343 18     Temp 05/23/19 2343 99.3 F (37.4 C)     Temp src --      SpO2 05/23/19 2343 98 %     Weight 05/23/19 2344 117 kg (258 lb)     Height 05/23/19 2344 1.727 m (5\' 8" )  Head Circumference --      Peak Flow --      Pain Score 05/23/19 2344 4     Pain Loc --      Pain Edu? --      Excl. in [place]? --     Constitutional: Alert and oriented. Well appearing and in no acute distress. Eyes: Conjunctivae are normal.  Head: Atraumatic. Nose: No congestion/rhinnorhea. Mouth/Throat: Mucous membranes are moist. Neck: No stridor.  No meningeal signs.   Cardiovascular: Normal rate, regular rhythm. Good peripheral circulation. Grossly normal heart sounds. Respiratory: Normal respiratory effort.  No retractions. No audible wheezing. Gastrointestinal: Soft and nontender. No distention.  Musculoskeletal: No lower extremity tenderness nor edema. No gross deformities of extremities. Neurologic:  Normal speech and language. No gross focal neurologic deficits are appreciated.  Skin:  Skin is warm, dry and intact. No rash noted.  No extremity nor perioral cyanosis. Psychiatric: Mood and  affect are normal. Speech and behavior are normal.  ____________________________________________   LABS (all labs ordered are listed, but only abnormal results are displayed)  Labs Reviewed  BASIC METABOLIC PANEL - Abnormal; Notable for the following components:      Result Value   Glucose, Bld 116 (*)    All other components within normal limits  NOVEL CORONAVIRUS, NAA (HOSPITAL ORDER, SEND-OUT TO REF LAB)  CBC  POC URINE PREG, ED  POCT PREGNANCY, URINE   ____________________________________________  EKG  ED ECG REPORT I, Hinda Kehr, the attending physician, personally viewed and interpreted this ECG.  Date: 05/23/2019 EKG Time: 23:41 Rate: 90 Rhythm: normal sinus rhythm QRS Axis: normal Intervals: normal ST/T Wave abnormalities: Inverted T-waves in lead III, otherwise normal Narrative Interpretation: no evidence of acute ischemia  ____________________________________________  RADIOLOGY I, Hinda Kehr, personally viewed and evaluated these images (plain radiographs) as part of my medical decision making, as well as reviewing the written report by the radiologist.  ED MD interpretation:  No indication of pneumonia  Official radiology report(s): Dg Chest 1 View  Result Date: 05/24/2019 CLINICAL DATA:  Shortness of breath and chest pain EXAM: CHEST  1 VIEW COMPARISON:  08/12/2015 FINDINGS: The heart size and mediastinal contours are within normal limits. Both lungs are clear. The visualized skeletal structures are unremarkable. IMPRESSION: No active disease. Electronically Signed   By: Constance Holster M.D.   On: 05/24/2019 00:57    ____________________________________________   PROCEDURES   Procedure(s) performed (including Critical Care):  Procedures   ____________________________________________   INITIAL IMPRESSION / MDM / [place] / ED COURSE  As part of my medical decision making, I reviewed the following data within the [place] notes reviewed and incorporated, Labs reviewed , EKG interpreted , Old chart reviewed, Radiograph reviewed  and Notes from prior ED visits   Differential diagnosis includes, but is not limited to, COVID-19, nonspecific viral illness, pneumonia, urinary tract infection, electrolyte or metabolic abnormality.  The patient's labs are within normal limits, she is not pregnant, and she has stable vital signs with no tachycardia, no fever, and no hypoxemia.  She is in no respiratory distress.  She has no increased risk for COVID-19 compared to the general population and she has an outpatient test pending.  Upon her arrival she was swabbed for COVID-19 and I am sending the send out test but she does not meet criteria under the current protocol for a rapid test.  I explained this to her and encouraged her to check my chart for  the results within a couple of days.  I gave her my usual customary COVID-19 precautions, management recommendations, and return precautions, and she understands and agrees with the plan.          ____________________________________________  FINAL CLINICAL IMPRESSION(S) / ED DIAGNOSES  Final diagnoses:  Influenza-like illness     MEDICATIONS GIVEN DURING THIS VISIT:  Medications - No data to display   ED Discharge Orders    None      *Please note:  Melissa Martinez was evaluated in Emergency Department on 05/24/2019 for the symptoms described in the history of present illness. She was evaluated in the context of the global COVID-19 pandemic, which necessitated consideration that the patient might be at risk for infection with the SARS-CoV-2 virus that causes COVID-19. Institutional protocols and algorithms that pertain to the evaluation of patients at risk for COVID-19 are in a state of rapid change based on information released by regulatory bodies including the CDC and federal and state organizations. These policies and algorithms were followed  during the patient's care in the ED.  Some ED evaluations and interventions may be delayed as a result of limited staffing during the pandemic.*  Note:  This document was prepared using Dragon voice recognition software and may include unintentional dictation errors.   Loleta RoseForbach, Lillia Lengel, MD 05/24/19 [phone]

## 2019-05-25 LAB — NOVEL CORONAVIRUS, NAA (HOSP ORDER, SEND-OUT TO REF LAB; TAT 18-24 HRS): SARS-CoV-2, NAA: NOT DETECTED

## 2019-05-28 DIAGNOSIS — J019 Acute sinusitis, unspecified: Secondary | ICD-10-CM | POA: Diagnosis not present

## 2019-05-28 DIAGNOSIS — J029 Acute pharyngitis, unspecified: Secondary | ICD-10-CM | POA: Diagnosis not present

## 2019-05-28 DIAGNOSIS — Z79899 Other long term (current) drug therapy: Secondary | ICD-10-CM | POA: Diagnosis not present

## 2019-05-28 DIAGNOSIS — R5383 Other fatigue: Secondary | ICD-10-CM | POA: Diagnosis not present

## 2019-06-03 ENCOUNTER — Other Ambulatory Visit
Admission: RE | Admit: 2019-06-03 | Discharge: 2019-06-03 | Disposition: A | Discharge status: Home / Self Care | Payer: BC Managed Care – PPO | Source: Ambulatory Visit | Attending: Family Medicine | Admitting: Family Medicine

## 2019-06-03 ENCOUNTER — Other Ambulatory Visit: Payer: Self-pay

## 2019-06-03 ENCOUNTER — Other Ambulatory Visit: Payer: Self-pay | Admitting: Family Medicine

## 2019-06-03 ENCOUNTER — Ambulatory Visit
Admission: RE | Admit: 2019-06-03 | Discharge: 2019-06-03 | Disposition: A | Discharge status: Home / Self Care | Payer: BC Managed Care – PPO | Source: Ambulatory Visit | Attending: Family Medicine | Admitting: Family Medicine

## 2019-06-03 DIAGNOSIS — R071 Chest pain on breathing: Secondary | ICD-10-CM | POA: Insufficient documentation

## 2019-06-03 DIAGNOSIS — R0602 Shortness of breath: Secondary | ICD-10-CM | POA: Diagnosis not present

## 2019-06-03 LAB — CREATININE, SERUM
Creatinine, Ser: 0.5 mg/dL (ref 0.44–1.00)
GFR calc Af Amer: >60 mL/min (ref >60)
GFR calc non Af Amer: >60 mL/min (ref >60)

## 2019-06-03 LAB — TROPONIN I (HIGH SENSITIVITY): Troponin I (High Sensitivity): <2 ng/L (ref <18)

## 2019-06-03 LAB — BUN: BUN: 15 mg/dL (ref 6–20)

## 2019-06-03 MED ORDER — IOHEXOL 350 MG/ML SOLN
75.0000 mL | Freq: Once | INTRAVENOUS | Status: AC | PRN
  Administered 2019-06-03: 17:00:00 75 mL via INTRAVENOUS

## 2019-06-28 DIAGNOSIS — H0279 Other degenerative disorders of eyelid and periocular area: Secondary | ICD-10-CM | POA: Diagnosis not present

## 2019-06-28 DIAGNOSIS — H53483 Generalized contraction of visual field, bilateral: Secondary | ICD-10-CM | POA: Diagnosis not present

## 2019-06-28 DIAGNOSIS — H57811 Brow ptosis, right: Secondary | ICD-10-CM | POA: Diagnosis not present

## 2019-06-28 DIAGNOSIS — H02411 Mechanical ptosis of right eyelid: Secondary | ICD-10-CM | POA: Diagnosis not present

## 2019-07-01 DIAGNOSIS — J343 Hypertrophy of nasal turbinates: Secondary | ICD-10-CM | POA: Diagnosis not present

## 2019-07-01 DIAGNOSIS — J0101 Acute recurrent maxillary sinusitis: Secondary | ICD-10-CM | POA: Diagnosis not present

## 2019-07-03 DIAGNOSIS — H53481 Generalized contraction of visual field, right eye: Secondary | ICD-10-CM | POA: Diagnosis not present

## 2019-07-11 ENCOUNTER — Other Ambulatory Visit: Payer: Self-pay

## 2019-07-11 ENCOUNTER — Encounter: Payer: Self-pay | Admitting: *Deleted

## 2019-07-12 ENCOUNTER — Other Ambulatory Visit
Admission: RE | Admit: 2019-07-12 | Discharge: 2019-07-12 | Disposition: A | Discharge status: Home / Self Care | Payer: BC Managed Care – PPO | Source: Ambulatory Visit | Attending: Otolaryngology | Admitting: Otolaryngology

## 2019-07-12 DIAGNOSIS — Z01812 Encounter for preprocedural laboratory examination: Secondary | ICD-10-CM | POA: Diagnosis not present

## 2019-07-12 DIAGNOSIS — Z20828 Contact with and (suspected) exposure to other viral communicable diseases: Secondary | ICD-10-CM | POA: Diagnosis not present

## 2019-07-13 LAB — SARS CORONAVIRUS 2 (TAT 6-24 HRS): SARS Coronavirus 2: NEGATIVE

## 2019-07-16 NOTE — Discharge Instructions (Signed)
[facility] °[facility] °ENDOSCOPIC SINUS SURGERY °[facility] ° °What is Functional Endoscopic Sinus Surgery? ° The Surgery involves making the natural openings of the sinuses larger by removing the bony partitions that separate the sinuses from the nasal cavity.  The natural sinus lining is preserved as much as possible to allow the sinuses to resume normal function after the surgery.  In some patients nasal polyps (excessively swollen lining of the sinuses) may be removed to relieve obstruction of the sinus openings.  The surgery is performed through the nose using lighted scopes, which eliminates the need for incisions on the face.  A septoplasty is a different procedure which is sometimes performed with sinus surgery.  It involves straightening the boy partition that separates the two sides of your nose.  A crooked or deviated septum may need repair if is obstructing the sinuses or nasal airflow.  Turbinate reduction is also often performed during sinus surgery.  The turbinates are bony proturberances from the side walls of the nose which swell and can obstruct the nose in patients with sinus and allergy problems.  Their size can be surgically reduced to help relieve nasal obstruction. ° °What Can Sinus Surgery Do For Me? ° Sinus surgery can reduce the frequency of sinus infections requiring antibiotic treatment.  This can provide improvement in nasal congestion, post-nasal drainage, facial pressure and nasal obstruction.  Surgery will NOT prevent you from ever having an infection again, so it usually only for patients who get infections 4 or more times yearly requiring antibiotics, or for infections that do not clear with antibiotics.  It will not cure nasal allergies, so patients with allergies may still require medication to treat their allergies after surgery. Surgery may improve headaches related to sinusitis, however, some people will continue to  require medication to control sinus headaches related to allergies.  Surgery will do nothing for other forms of headache (migraine, tension or cluster). ° °What Are the Risks of Endoscopic Sinus Surgery? ° Current techniques allow surgery to be performed safely with little risk, however, there are rare complications that patients should be aware of.  Because the sinuses are located around the eyes, there is risk of eye injury, including blindness, though again, this would be quite rare. This is usually a result of bleeding behind the eye during surgery, which puts the vision oat risk, though there are treatments to protect the vision and prevent permanent disrupted by surgery causing a leak of the spinal fluid that surrounds the brain.  More serious complications would include bleeding inside the brain cavity or damage to the brain.  Again, all of these complications are uncommon, and spinal fluid leaks can be safely managed surgically if they occur.  The most common complication of sinus surgery is bleeding from the nose, which may require packing or cauterization of the nose.  Continued sinus have polyps may experience recurrence of the polyps requiring revision surgery.  Alterations of sense of smell or injury to the tear ducts are also rare complications.  ° °What is the Surgery Like, and what is the Recovery? ° The Surgery usually takes a couple of hours to perform, and is usually performed under a general anesthetic (completely asleep).  Patients are usually discharged home after a couple of hours.  Sometimes during surgery it is necessary to pack the nose to control bleeding, and the packing is left in place for 24 - 48 hours, and removed by your surgeon.    If a septoplasty was performed during the procedure, there is often a splint placed which must be removed after 5-7 days.   °Discomfort: Pain is usually mild to moderate, and can be controlled by prescription pain medication or acetaminophen (Tylenol).   Aspirin, Ibuprofen (Advil, Motrin), or Naprosyn (Aleve) should be avoided, as they can cause increased bleeding.  Most patients feel sinus pressure like they have a bad head cold for several days.  Sleeping with your head elevated can help reduce swelling and facial pressure, as can ice packs over the face.  A humidifier may be helpful to keep the mucous and blood from drying in the nose.  ° °Diet: There are no specific diet restrictions, however, you should generally start with clear liquids and a light diet of bland foods because the anesthetic can cause some nausea.  Advance your diet depending on how your stomach feels.  Taking your pain medication with food will often help reduce stomach upset which pain medications can cause. ° °Nasal Saline Irrigation: It is important to remove blood clots and dried mucous from the nose as it is healing.  This is done by having you irrigate the nose at least 3 - 4 times daily with a salt water solution.  We recommend using NeilMed Sinus Rinse (available at the drug store).  Fill the squeeze bottle with the solution, bend over a sink, and insert the tip of the squeeze bottle into the nose ½ of an inch.  Point the tip of the squeeze bottle towards the inside corner of the eye on the same side your irrigating.  Squeeze the bottle and gently irrigate the nose.  If you bend forward as you do this, most of the fluid will flow back out of the nose, instead of down your throat.   The solution should be warm, near body temperature, when you irrigate.   Each time you irrigate, you should use a full squeeze bottle.  ° °Note that if you are instructed to use Nasal Steroid Sprays at any time after your surgery, irrigate with saline BEFORE using the steroid spray, so you do not wash it all out of the nose. °Another product, Nasal Saline Gel (such as AYR Nasal Saline Gel) can be applied in each nostril 3 - 4 times daily to moisture the nose and reduce scabbing or crusting. ° °Bleeding:   Bloody drainage from the nose can be expected for several days, and patients are instructed to irrigate their nose frequently with salt water to help remove mucous and blood clots.  The drainage may be dark red or brown, though some fresh blood may be seen intermittently, especially after irrigation.  Do not blow you nose, as bleeding may occur. If you must sneeze, keep your mouth open to allow air to escape through your mouth. ° °If heavy bleeding occurs: Irrigate the nose with saline to rinse out clots, then spray the nose 3 - 4 times with Afrin Nasal Decongestant Spray.  The spray will constrict the blood vessels to slow bleeding.  Pinch the lower half of your nose shut to apply pressure, and lay down with your head elevated.  Ice packs over the nose may help as well. If bleeding persists despite these measures, you should notify your doctor.  Do not use the Afrin routinely to control nasal congestion after surgery, as it can result in worsening congestion and may affect healing.  ° ° ° °Activity: Return to work varies among patients. Most patients will be   out of work at least 5 - 7 days to recover.  Patient may return to work after they are off of narcotic pain medication, and feeling well enough to perform the functions of their job.  Patients must avoid heavy lifting (over 10 pounds) or strenuous physical for 2 weeks after surgery, so your employer may need to assign you to light duty, or keep you out of work longer if light duty is not possible.  NOTE: you should not drive, operate dangerous machinery, do any mentally demanding tasks or make any important legal or financial decisions while on narcotic pain medication and recovering from the general anesthetic.  °  °Call Your Doctor Immediately if You Have Any of the Following: °1. Bleeding that you cannot control with the above measures °2. Loss of vision, double vision, bulging of the eye or black eyes. °3. Fever over 101 degrees °4. Neck stiffness with  severe headache, fever, nausea and change in mental state. °You are always encourage to call anytime with concerns, however, please call with requests for pain medication refills during office hours. ° °Office Endoscopy: During follow-up visits your doctor will remove any packing or splints that may have been placed and evaluate and clean your sinuses endoscopically.  Topical anesthetic will be used to make this as comfortable as possible, though you may want to take your pain medication prior to the visit.  How often this will need to be done varies from patient to patient.  After complete recovery from the surgery, you may need follow-up endoscopy from time to time, particularly if there is concern of recurrent infection or nasal polyps. ° ° °General Anesthesia, Adult, Care After °This sheet gives you information about how to care for yourself after your procedure. Your health care provider may also give you more specific instructions. If you have problems or questions, contact your health care provider. °What can I expect after the procedure? °After the procedure, the following side effects are common: °· Pain or discomfort at the IV site. °· Nausea. °· Vomiting. °· Sore throat. °· Trouble concentrating. °· Feeling cold or chills. °· Weak or tired. °· Sleepiness and fatigue. °· Soreness and body aches. These side effects can affect parts of the body that were not involved in surgery. °Follow these instructions at home: ° °For at least 24 hours after the procedure: °· Have a responsible adult stay with you. It is important to have someone help care for you until you are awake and alert. °· Rest as needed. °· Do not: °? Participate in activities in which you could fall or become injured. °? Drive. °? Use heavy machinery. °? Drink alcohol. °? Take sleeping pills or medicines that cause drowsiness. °? Make important decisions or sign legal documents. °? Take care of children on your own. °Eating and  drinking °· Follow any instructions from your health care provider about eating or drinking restrictions. °· When you feel hungry, start by eating small amounts of foods that are soft and easy to digest (bland), such as toast. Gradually return to your regular diet. °· Drink enough fluid to keep your urine pale yellow. °· If you vomit, rehydrate by drinking water, juice, or clear broth. °General instructions °· If you have sleep apnea, surgery and certain medicines can increase your risk for breathing problems. Follow instructions from your health care provider about wearing your sleep device: °? Anytime you are sleeping, including during daytime naps. °? While taking prescription pain medicines, sleeping medicines, or medicines   that make you drowsy. °· Return to your normal activities as told by your health care provider. Ask your health care provider what activities are safe for you. °· Take over-the-counter and prescription medicines only as told by your health care provider. °· If you smoke, do not smoke without supervision. °· Keep all follow-up visits as told by your health care provider. This is important. °Contact a health care provider if: °· You have nausea or vomiting that does not get better with medicine. °· You cannot eat or drink without vomiting. °· You have pain that does not get better with medicine. °· You are unable to pass urine. °· You develop a skin rash. °· You have a fever. °· You have redness around your IV site that gets worse. °Get help right away if: °· You have difficulty breathing. °· You have chest pain. °· You have blood in your urine or stool, or you vomit blood. °Summary °· After the procedure, it is common to have a sore throat or nausea. It is also common to feel tired. °· Have a responsible adult stay with you for the first 24 hours after general anesthesia. It is important to have someone help care for you until you are awake and alert. °· When you feel hungry, start by eating  small amounts of foods that are soft and easy to digest (bland), such as toast. Gradually return to your regular diet. °· Drink enough fluid to keep your urine pale yellow. °· Return to your normal activities as told by your health care provider. Ask your health care provider what activities are safe for you. °This information is not intended to replace advice given to you by your health care provider. Make sure you discuss any questions you have with your health care provider. °Document Released: 02/06/2001 Document Revised: 11/03/2017 Document Reviewed: 06/16/2017 °Elsevier Patient Education © 2020 Else[employer] Inc[employer] ° °

## 2019-07-17 ENCOUNTER — Encounter
Admission: RE | Disposition: A | Discharge status: Home / Self Care | Payer: Self-pay | Source: Home / Self Care | Attending: Otolaryngology

## 2019-07-17 ENCOUNTER — Ambulatory Visit: Payer: BC Managed Care – PPO | Admitting: Anesthesiology

## 2019-07-17 ENCOUNTER — Ambulatory Visit
Admission: RE | Admit: 2019-07-17 | Discharge: 2019-07-17 | Disposition: A | Discharge status: Home / Self Care | Payer: BC Managed Care – PPO | Attending: Otolaryngology | Admitting: Otolaryngology

## 2019-07-17 ENCOUNTER — Other Ambulatory Visit: Payer: Self-pay

## 2019-07-17 DIAGNOSIS — J320 Chronic maxillary sinusitis: Secondary | ICD-10-CM | POA: Diagnosis not present

## 2019-07-17 DIAGNOSIS — J0101 Acute recurrent maxillary sinusitis: Secondary | ICD-10-CM | POA: Insufficient documentation

## 2019-07-17 DIAGNOSIS — J343 Hypertrophy of nasal turbinates: Secondary | ICD-10-CM | POA: Insufficient documentation

## 2019-07-17 HISTORY — PX: MAXILLARY ANTROSTOMY: SHX2003

## 2019-07-17 HISTORY — PX: BALLOON SINUPLASTY: SHX5740

## 2019-07-17 HISTORY — PX: ENDOSCOPIC TURBINATE REDUCTION: SHX6489

## 2019-07-17 HISTORY — DX: Other specified postprocedural states: R11.2

## 2019-07-17 HISTORY — DX: Occipital neuralgia: M54.81

## 2019-07-17 HISTORY — DX: Other specified postprocedural states: Z98.890

## 2019-07-17 LAB — POCT PREGNANCY, URINE: Preg Test, Ur: NEGATIVE

## 2019-07-17 SURGERY — REDUCTION, NASAL TURBINATE, ENDOSCOPIC
Anesthesia: General | Site: Nose | Laterality: Right

## 2019-07-17 MED ORDER — DEXAMETHASONE SODIUM PHOSPHATE 4 MG/ML IJ SOLN
INTRAMUSCULAR | Status: DC | PRN
  Administered 2019-07-17: 10 mg via INTRAVENOUS

## 2019-07-17 MED ORDER — DIPHENHYDRAMINE HCL 50 MG/ML IJ SOLN
INTRAMUSCULAR | Status: DC | PRN
  Administered 2019-07-17: 12.5 mg via INTRAVENOUS

## 2019-07-17 MED ORDER — PROMETHAZINE HCL 25 MG/ML IJ SOLN
6.2500 mg | Freq: Four times a day (QID) | INTRAMUSCULAR | Status: DC | PRN
  Administered 2019-07-17: 6.25 mg via INTRAVENOUS

## 2019-07-17 MED ORDER — ACETAMINOPHEN 10 MG/ML IV SOLN
1000.0000 mg | Freq: Once | INTRAVENOUS | Status: AC
  Administered 2019-07-17: 1000 mg via INTRAVENOUS

## 2019-07-17 MED ORDER — FENTANYL CITRATE (PF) 100 MCG/2ML IJ SOLN
25.0000 ug | INTRAMUSCULAR | Status: DC | PRN
  Administered 2019-07-17: 50 ug via INTRAVENOUS
  Administered 2019-07-17 (×2): 25 ug via INTRAVENOUS

## 2019-07-17 MED ORDER — ONDANSETRON HCL 4 MG PO TABS: 4.0000 mg | ORAL_TABLET | Freq: Three times a day (TID) | ORAL | 0 refills | Status: DC | PRN

## 2019-07-17 MED ORDER — DEXMEDETOMIDINE HCL 200 MCG/2ML IV SOLN
INTRAVENOUS | Status: DC | PRN
  Administered 2019-07-17: 5 ug via INTRAVENOUS
  Administered 2019-07-17: 10 ug via INTRAVENOUS

## 2019-07-17 MED ORDER — GLYCOPYRROLATE 0.2 MG/ML IJ SOLN
INTRAMUSCULAR | Status: DC | PRN
  Administered 2019-07-17: 0.1 mg via INTRAVENOUS

## 2019-07-17 MED ORDER — LIDOCAINE-EPINEPHRINE 1 %-1:100000 IJ SOLN
INTRAMUSCULAR | Status: DC | PRN
  Administered 2019-07-17: 6 mL

## 2019-07-17 MED ORDER — ONDANSETRON HCL 4 MG/2ML IJ SOLN
INTRAMUSCULAR | Status: DC | PRN
  Administered 2019-07-17: 4 mg via INTRAVENOUS

## 2019-07-17 MED ORDER — OXYCODONE HCL 5 MG/5ML PO SOLN: 5.0000 mg | Freq: Once | ORAL | Status: AC | PRN

## 2019-07-17 MED ORDER — LIDOCAINE HCL (CARDIAC) PF 100 MG/5ML IV SOSY
PREFILLED_SYRINGE | INTRAVENOUS | Status: DC | PRN
  Administered 2019-07-17: 50 mg via INTRAVENOUS

## 2019-07-17 MED ORDER — SCOPOLAMINE 1 MG/3DAYS TD PT72
1.0000 | MEDICATED_PATCH | TRANSDERMAL | Status: DC
  Administered 2019-07-17: 1.5 mg via TRANSDERMAL

## 2019-07-17 MED ORDER — AMOXICILLIN-POT CLAVULANATE 875-125 MG PO TABS: 1.0000 | ORAL_TABLET | Freq: Two times a day (BID) | ORAL | 0 refills | Status: DC

## 2019-07-17 MED ORDER — LACTATED RINGERS IV SOLN
INTRAVENOUS | Status: DC | PRN
  Administered 2019-07-17: 10:00:00 via INTRAVENOUS

## 2019-07-17 MED ORDER — MIDAZOLAM HCL 5 MG/5ML IJ SOLN
INTRAMUSCULAR | Status: DC | PRN
  Administered 2019-07-17: 2 mg via INTRAVENOUS

## 2019-07-17 MED ORDER — PREDNISONE 10 MG (21) PO TBPK: ORAL_TABLET | ORAL | 0 refills | Status: DC

## 2019-07-17 MED ORDER — SUCCINYLCHOLINE CHLORIDE 20 MG/ML IJ SOLN
INTRAMUSCULAR | Status: DC | PRN
  Administered 2019-07-17: 100 mg via INTRAVENOUS

## 2019-07-17 MED ORDER — FENTANYL CITRATE (PF) 100 MCG/2ML IJ SOLN
INTRAMUSCULAR | Status: DC | PRN
  Administered 2019-07-17: 25 ug via INTRAVENOUS
  Administered 2019-07-17: 100 ug via INTRAVENOUS
  Administered 2019-07-17: 25 ug via INTRAVENOUS

## 2019-07-17 MED ORDER — HYDROCODONE-ACETAMINOPHEN 5-325 MG PO TABS: 1.0000 | ORAL_TABLET | ORAL | 0 refills | Status: DC | PRN

## 2019-07-17 MED ORDER — OXYMETAZOLINE HCL 0.05 % NA SOLN
NASAL | Status: DC | PRN
  Administered 2019-07-17: 1 via TOPICAL

## 2019-07-17 MED ORDER — KETOROLAC TROMETHAMINE 30 MG/ML IJ SOLN: 30.0000 mg | Freq: Once | INTRAMUSCULAR | Status: DC | PRN

## 2019-07-17 MED ORDER — PROPOFOL 10 MG/ML IV BOLUS
INTRAVENOUS | Status: DC | PRN
  Administered 2019-07-17: 50 mg via INTRAVENOUS
  Administered 2019-07-17: 200 mg via INTRAVENOUS

## 2019-07-17 MED ORDER — OXYCODONE HCL 5 MG PO TABS
5.0000 mg | ORAL_TABLET | Freq: Once | ORAL | Status: AC | PRN
  Administered 2019-07-17: 5 mg via ORAL

## 2019-07-17 SURGICAL SUPPLY — 23 items
BALLN SINUPLASTY KIT 6X16 (BALLOONS) ×4
BALLOON SINUPLASTY KIT 6X16 (BALLOONS) ×3 IMPLANT
CANISTER SUCT 1200ML W/VALVE (MISCELLANEOUS) ×4 IMPLANT
COAG SUCT 10F 3.5MM HAND CTRL (MISCELLANEOUS) ×4 IMPLANT
DEVICE INFLATION SEID (MISCELLANEOUS) ×4 IMPLANT
DRESSING NASL FOAM PST OP SINU (MISCELLANEOUS) ×3 IMPLANT
DRSG NASAL FOAM POST OP SINU (MISCELLANEOUS) ×4
ELECT REM PT RETURN 9FT ADLT (ELECTROSURGICAL) ×4
ELECTRODE REM PT RTRN 9FT ADLT (ELECTROSURGICAL) ×3 IMPLANT
GLOVE BIO SURGEON STRL SZ7.5 (GLOVE) ×12 IMPLANT
GOWN STRL REUS W/ TWL LRG LVL3 (GOWN DISPOSABLE) ×3 IMPLANT
GOWN STRL REUS W/TWL LRG LVL3 (GOWN DISPOSABLE) ×1
KIT TURNOVER KIT A (KITS) ×4 IMPLANT
NS IRRIG 500ML POUR BTL (IV SOLUTION) ×4 IMPLANT
PACK ENT CUSTOM (PACKS) ×4 IMPLANT
PACKING NASAL EPIS 4X2.4 XEROG (MISCELLANEOUS) ×4 IMPLANT
PATTIES SURGICAL .5 X3 (DISPOSABLE) ×4 IMPLANT
SOL ANTI-FOG 6CC FOG-OUT (MISCELLANEOUS) ×3 IMPLANT
SOL FOG-OUT ANTI-FOG 6CC (MISCELLANEOUS) ×1
STRAP BODY AND KNEE 60X3 (MISCELLANEOUS) ×4 IMPLANT
SYR 10ML LL (SYRINGE) ×4 IMPLANT
SYR BULB 3OZ (MISCELLANEOUS) IMPLANT
WATER STERILE IRR 250ML POUR (IV SOLUTION) ×4 IMPLANT

## 2019-07-17 NOTE — Transfer of Care (Signed)
Immediate Anesthesia Transfer of Care Note  Patient: Melissa Martinez  Procedure(s) Performed: ENDOSCOPIC TURBINATE REDUCTION (Bilateral Nose) MAXILLARY ANTROSTOMY (Right Nose) BALLOON SINUPLASTY (Left Nose)  Patient Location: PACU  Anesthesia Type: General  Level of Consciousness: awake, alert  and patient cooperative  Airway and Oxygen Therapy: Patient Spontanous Breathing and Patient connected to supplemental oxygen  Post-op Assessment: Post-op Vital signs reviewed, Patient's Cardiovascular Status Stable, Respiratory Function Stable, Patent Airway and No signs of Nausea or vomiting  Post-op Vital Signs: Reviewed and stable  Complications: No apparent anesthesia complications

## 2019-07-17 NOTE — Anesthesia Preprocedure Evaluation (Addendum)
Anesthesia Evaluation  Patient identified by MRN, date of birth, ID band Patient awake    History of Anesthesia Complications (+) PONV, PROLONGED EMERGENCE and history of anesthetic complications  Airway Mallampati: II  TM Distance: >3 FB Neck ROM: Full    Dental no notable dental hx.    Pulmonary neg pulmonary ROS,    Pulmonary exam normal        Cardiovascular Exercise Tolerance: Good negative cardio ROS Normal cardiovascular exam     Neuro/Psych negative neurological ROS     GI/Hepatic negative GI ROS, Neg liver ROS,   Endo/Other  Morbid obesity  Renal/GU negative Renal ROS     Musculoskeletal   Abdominal   Peds  Hematology   Anesthesia Other Findings   Reproductive/Obstetrics                             Anesthesia Physical Anesthesia Plan  ASA: II  Anesthesia Plan: General   Post-op Pain Management:    Induction:   PONV Risk Score and Plan: Scopolamine patch - Pre-op, Midazolam, Propofol infusion, Ondansetron and Dexamethasone  Airway Management Planned: Oral ETT  Additional Equipment:   Intra-op Plan:   Post-operative Plan: Extubation in OR  Informed Consent: I have reviewed the patients History and Physical, chart, labs and discussed the procedure including the risks, benefits and alternatives for the proposed anesthesia with the patient or authorized representative who has indicated his/her understanding and acceptance.       Plan Discussed with:   Anesthesia Plan Comments:         Anesthesia Quick Evaluation

## 2019-07-17 NOTE — Anesthesia Procedure Notes (Signed)
Procedure Name: Intubation Date/Time: 07/17/2019 10:18 AM Performed by: Mayme Genta, CRNA Pre-anesthesia Checklist: Patient identified, Emergency Drugs available, Suction available, Patient being monitored and Timeout performed Patient Re-evaluated:Patient Re-evaluated prior to induction Oxygen Delivery Method: Circle system utilized Preoxygenation: Pre-oxygenation with 100% oxygen Induction Type: IV induction Ventilation: Mask ventilation without difficulty Laryngoscope Size: Miller and 2 Grade View: Grade I Tube type: Oral Rae Tube size: 7.0 mm Number of attempts: 1 Placement Confirmation: ETT inserted through vocal cords under direct vision,  positive ETCO2 and breath sounds checked- equal and bilateral Tube secured with: Tape Dental Injury: Teeth and Oropharynx as per pre-operative assessment

## 2019-07-17 NOTE — Op Note (Signed)
..07/17/2019  11:25 AM    Melissa Martinez  [id number]    Pre-Op Dx:  Recurrent Acute Maxillary sinusitis Hypertrophic Inferior Turbinates  Post-op Dx: Same  Proc: 1)  Right Maxillary Antrostomy  2)  Left Balloon Maxillary sinuplasty  3)  Bilateral inferior turbinate reduction  Surg:  Melissa Martinez  Anes:  GOT  EBL:  64ml  Comp:  none  Findings: Large Haller cell obstructing right OMC limiting ability of balloon to dilate ostia.  This was opened surgically, successful dilation of left maxillary ostia with balloon with reduction of Haller cell.  Large soft tissue and bone hypertrophy of inferior turbinates reduced.  Procedure: With the patient in a comfortable supine position,  general orotracheal anesthesia was induced without difficulty.  The patient received preoperative Afrin spray for topical decongestion and vasoconstriction.  At an appropriate level, the patient was placed in a semi-sitting position.  Nasal vibrissae were trimmed.   1% Xylocaine with 1:100,000 epinephrine, 6 cc's, was infiltrated into the anterior floor of the nose, into the nasal spine region, into the membranous columella, and finally into the submucoperichondrial plane of the septum on both sides.  Several minutes were allowed for this to take effect.  Cottoniod pledgetts soaked in Afrin were placed into both nasal cavities and left while the patient was prepped and draped in the standard fashion.   A proper time-out was performed.    The materials were removed from the nose and observed to be intact and correct in number.  The nose was inspected with a headlight and zero degree endoscope with the findings as described above.  The inferior turbinates were then inspected.  Under endoscopic visualization, the inferior turbinates were infractured bilaterally with a Soil scientist.  A kelly clamp was attached to the anterior-inferior third of each inferior turbinate for approximately one minute.  Under  endoscopic visualization, Tru-cutting forceps were used to remove the anterior-inferior third of each inferior turbinate.  Electrocautery was used to control bleeding in the area. The remaining turbinate was then outfractured to open up the airway further. There was no significant bleeding noted. The right turbinate was then trimmed and outfractured in a similar fashion.  The airways were then visualized and showed open passageways on both sides that were significantly improved compared to before surgery.       At this point, attention was directed to the maxillary antrostomies.  Using the Acclarent balloon sinuplasty device, the guide wire was threaded into the left maxillary sinus after medialization of the middle turbinate.  The balloon passed freely and this sinus was dilated x4 with bulging of the uncinate process on the final dilation.  Attention was directed to the right maxillary sinus.  The uncinate was brought forward with right angle probe and the balloon device was attempted to dilate.  Due to obstructive haller cell, however the dilation was not sufficient.  At this time, a formal maxillary antrostomy was performed.  The uncinate process was removed using pediatric backbiting forceps.  Using a ball tipped probe and straight biting forceps, the party wall  Between the haller cell and the maxilary sinus was removed down to significant osteitic bone.  A large maxillary antrostomy with connection to the natural ostia was ensured with 30 degree scope.  There was no signifcant bleeding. Nasal splints were applied to both sides of the septum using Stamberger sinufoam was placed along the cut edge of the inferior turbinates bilaterally.  Xerogel was placed lateral to the right middle turbiante.  The patient was turned back over to anesthesia, and awakened, extubated, and taken to the PACU in satisfactory condition.  Dispo:   PACU to home  Plan: Ice, elevation, narcotic analgesia, steroid taper,  and prophylactic antibiotics. we will reevaluate the patient in the office in 7 days, no strenuous activities in two weeks.   Melissa Martinez 07/17/2019 11:25 AM

## 2019-07-17 NOTE — Anesthesia Postprocedure Evaluation (Signed)
Anesthesia Post Note  Patient: Melissa Martinez  Procedure(s) Performed: ENDOSCOPIC TURBINATE REDUCTION (Bilateral Nose) MAXILLARY ANTROSTOMY (Right Nose) BALLOON SINUPLASTY (Left Nose)  Patient location during evaluation: PACU Anesthesia Type: General Level of consciousness: awake and alert Pain management: pain level controlled Vital Signs Assessment: post-procedure vital signs reviewed and stable Respiratory status: spontaneous breathing, nonlabored ventilation, respiratory function stable and patient connected to nasal cannula oxygen Cardiovascular status: blood pressure returned to baseline and stable Postop Assessment: no apparent nausea or vomiting Anesthetic complications: no    Adele Barthel Alezander Dimaano

## 2019-07-17 NOTE — H&P (Signed)
..  History and Physical paper copy reviewed and updated date of procedure and will be scanned into system.  Patient seen and examined.  

## 2019-07-18 ENCOUNTER — Encounter: Payer: Self-pay | Admitting: Otolaryngology

## 2019-07-24 DIAGNOSIS — J343 Hypertrophy of nasal turbinates: Secondary | ICD-10-CM | POA: Diagnosis not present

## 2019-07-24 DIAGNOSIS — J0101 Acute recurrent maxillary sinusitis: Secondary | ICD-10-CM | POA: Diagnosis not present

## 2019-07-24 DIAGNOSIS — J320 Chronic maxillary sinusitis: Secondary | ICD-10-CM | POA: Diagnosis not present

## 2019-08-01 DIAGNOSIS — Z48813 Encounter for surgical aftercare following surgery on the respiratory system: Secondary | ICD-10-CM | POA: Diagnosis not present

## 2019-08-07 ENCOUNTER — Ambulatory Visit: Payer: BLUE CROSS/BLUE SHIELD

## 2019-08-16 DIAGNOSIS — J320 Chronic maxillary sinusitis: Secondary | ICD-10-CM | POA: Diagnosis not present

## 2019-08-21 DIAGNOSIS — R519 Headache, unspecified: Secondary | ICD-10-CM | POA: Diagnosis not present

## 2019-08-21 DIAGNOSIS — I10 Essential (primary) hypertension: Secondary | ICD-10-CM | POA: Diagnosis not present

## 2019-08-21 DIAGNOSIS — E282 Polycystic ovarian syndrome: Secondary | ICD-10-CM | POA: Diagnosis not present

## 2019-09-09 DIAGNOSIS — F411 Generalized anxiety disorder: Secondary | ICD-10-CM | POA: Diagnosis not present

## 2019-09-09 DIAGNOSIS — I10 Essential (primary) hypertension: Secondary | ICD-10-CM | POA: Diagnosis not present

## 2019-09-23 DIAGNOSIS — M79671 Pain in right foot: Secondary | ICD-10-CM | POA: Diagnosis not present

## 2019-09-26 DIAGNOSIS — Z20828 Contact with and (suspected) exposure to other viral communicable diseases: Secondary | ICD-10-CM | POA: Diagnosis not present

## 2019-10-04 DIAGNOSIS — S9031XA Contusion of right foot, initial encounter: Secondary | ICD-10-CM | POA: Diagnosis not present

## 2019-10-04 DIAGNOSIS — M84374A Stress fracture, right foot, initial encounter for fracture: Secondary | ICD-10-CM | POA: Diagnosis not present

## 2019-10-04 DIAGNOSIS — S90121A Contusion of right lesser toe(s) without damage to nail, initial encounter: Secondary | ICD-10-CM | POA: Diagnosis not present

## 2019-10-04 DIAGNOSIS — M25474 Effusion, right foot: Secondary | ICD-10-CM | POA: Diagnosis not present

## 2019-10-04 DIAGNOSIS — M79671 Pain in right foot: Secondary | ICD-10-CM | POA: Diagnosis not present

## 2019-10-09 DIAGNOSIS — J320 Chronic maxillary sinusitis: Secondary | ICD-10-CM | POA: Diagnosis not present

## 2019-10-22 DIAGNOSIS — J329 Chronic sinusitis, unspecified: Secondary | ICD-10-CM | POA: Diagnosis not present

## 2019-10-22 DIAGNOSIS — R05 Cough: Secondary | ICD-10-CM | POA: Diagnosis not present

## 2019-11-05 DIAGNOSIS — J0191 Acute recurrent sinusitis, unspecified: Secondary | ICD-10-CM | POA: Diagnosis not present

## 2019-11-05 DIAGNOSIS — Z03818 Encounter for observation for suspected exposure to other biological agents ruled out: Secondary | ICD-10-CM | POA: Diagnosis not present

## 2019-12-09 ENCOUNTER — Ambulatory Visit: Payer: BLUE CROSS/BLUE SHIELD | Admitting: Obstetrics & Gynecology

## 2019-12-19 ENCOUNTER — Other Ambulatory Visit: Payer: Self-pay | Admitting: Obstetrics & Gynecology

## 2019-12-19 NOTE — Telephone Encounter (Signed)
Called and left voice mail for patient to call back to be schedule °

## 2020-01-28 ENCOUNTER — Other Ambulatory Visit: Payer: Self-pay | Admitting: Obstetrics & Gynecology

## 2020-02-12 DIAGNOSIS — I10 Essential (primary) hypertension: Secondary | ICD-10-CM | POA: Insufficient documentation

## 2020-02-27 ENCOUNTER — Other Ambulatory Visit: Payer: Self-pay | Admitting: Otolaryngology

## 2020-02-27 DIAGNOSIS — M542 Cervicalgia: Secondary | ICD-10-CM

## 2020-03-12 ENCOUNTER — Ambulatory Visit
Admission: RE | Admit: 2020-03-12 | Discharge: 2020-03-12 | Disposition: A | Discharge status: Home / Self Care | Payer: BC Managed Care – PPO | Source: Ambulatory Visit | Attending: Otolaryngology | Admitting: Otolaryngology

## 2020-03-12 ENCOUNTER — Other Ambulatory Visit: Payer: Self-pay

## 2020-03-12 DIAGNOSIS — M542 Cervicalgia: Secondary | ICD-10-CM

## 2020-03-12 HISTORY — DX: Essential (primary) hypertension: I10

## 2020-03-12 LAB — POCT I-STAT CREATININE: Creatinine, Ser: 0.9 mg/dL (ref 0.44–1.00)

## 2020-03-12 MED ORDER — IOHEXOL 300 MG/ML  SOLN
75.0000 mL | Freq: Once | INTRAMUSCULAR | Status: AC | PRN
  Administered 2020-03-12: 75 mL via INTRAVENOUS

## 2020-04-14 DIAGNOSIS — R509 Fever, unspecified: Secondary | ICD-10-CM | POA: Insufficient documentation

## 2020-04-14 DIAGNOSIS — R5383 Other fatigue: Secondary | ICD-10-CM | POA: Insufficient documentation

## 2020-04-21 ENCOUNTER — Other Ambulatory Visit: Payer: Self-pay | Admitting: Obstetrics & Gynecology

## 2020-06-24 ENCOUNTER — Other Ambulatory Visit: Payer: Self-pay | Admitting: Family Medicine

## 2020-07-10 ENCOUNTER — Encounter: Payer: Self-pay | Admitting: Obstetrics & Gynecology

## 2020-07-10 ENCOUNTER — Ambulatory Visit: Payer: BC Managed Care – PPO | Admitting: Obstetrics & Gynecology

## 2020-07-10 ENCOUNTER — Telehealth: Payer: Self-pay | Admitting: Obstetrics & Gynecology

## 2020-07-10 ENCOUNTER — Other Ambulatory Visit: Payer: Self-pay

## 2020-07-10 VITALS — BP 140/90 | Ht 68.0 in | Wt 258.0 lb

## 2020-07-10 DIAGNOSIS — O209 Hemorrhage in early pregnancy, unspecified: Secondary | ICD-10-CM | POA: Diagnosis not present

## 2020-07-10 DIAGNOSIS — E282 Polycystic ovarian syndrome: Secondary | ICD-10-CM | POA: Diagnosis not present

## 2020-07-10 LAB — POCT URINE PREGNANCY: Preg Test, Ur: POSITIVE — AB

## 2020-07-10 NOTE — Addendum Note (Signed)
Addended by: Cornelius Moras D on: 07/10/2020 02:59 PM   Modules accepted: Orders

## 2020-07-10 NOTE — Patient Instructions (Signed)
Vaginal Bleeding During Pregnancy, First Trimester  A small amount of bleeding from the vagina (spotting) is relatively common during early pregnancy. It usually stops on its own. Various things may cause bleeding or spotting during early pregnancy. Some bleeding may be related to the pregnancy, and some may not. In many cases, the bleeding is normal and is not a problem. However, bleeding can also be a sign of something serious. Be sure to tell your health care provider about any vaginal bleeding right away. Some possible causes of vaginal bleeding during the first trimester include:  Infection or inflammation of the cervix.  Growths (polyps) on the cervix.  Miscarriage or threatened miscarriage.  Pregnancy tissue developing outside of the uterus (ectopic pregnancy).  A mass of tissue developing in the uterus due to an egg being fertilized incorrectly (molar pregnancy). Follow these instructions at home: Activity  Follow instructions from your health care provider about limiting your activity. Ask what activities are safe for you.  If needed, make plans for someone to help with your regular activities.  Do not have sex or orgasms until your health care provider says that this is safe. General instructions  Take over-the-counter and prescription medicines only as told by your health care provider.  Pay attention to any changes in your symptoms.  Do not use tampons or douche.  Write down how many pads you use each day, how often you change pads, and how soaked (saturated) they are.  If you pass any tissue from your vagina, save the tissue so you can show it to your health care provider.  Keep all follow-up visits as told by your health care provider. This is important. Contact a health care provider if:  You have vaginal bleeding during any part of your pregnancy.  You have cramps or labor pains.  You have a fever. Get help right away if:  You have severe cramps in your  back or abdomen.  You pass large clots or a large amount of tissue from your vagina.  Your bleeding increases.  You feel light-headed or weak, or you faint.  You have chills.  You are leaking fluid or have a gush of fluid from your vagina. Summary  A small amount of bleeding (spotting) from the vagina is relatively common during early pregnancy.  Various things may cause bleeding or spotting in early pregnancy.  Be sure to tell your health care provider about any vaginal bleeding right away. This information is not intended to replace advice given to you by your health care provider. Make sure you discuss any questions you have with your health care provider. Document Revised: 02/19/2019 Document Reviewed: 02/02/2017 Elsevier Patient Education  2020 [employer] Inc.  

## 2020-07-10 NOTE — Telephone Encounter (Signed)
Pt scheduled to see RPH at 230

## 2020-07-10 NOTE — Progress Notes (Signed)
Obstetric Problem Visit   Chief Complaint: First trimester bleeding  History of Present Illness: Patient is a 34 y.o. [id number] presenting for first trimester bleeding.  The onset of bleeding was yesterday and has been rust colored and slight.  She has irreg periods (2-3 per year based on PCOS) and last probable period was July 3.  Pos home uPT 06/27/20.  Is bleeding equal to or greater than normal menstrual flow:  No Any recent trauma:  No Recent intercourse:  No History of prior miscarriage:  Yes Prior ultrasound demonstrating IUP:  No Prior ultrasound demonstrating viable IUP:  No Prior Serum HCG:  No Rh status: NEG  PMHx: She  has a past medical history of Abdominal pain, generalized (11/11/2014), Altered bowel function (11/11/2014), Anxiety, Bleeding per rectum (11/11/2014), Calculus of kidney (12/14/2015), Complication of anesthesia, Fatty infiltration of liver (11/11/2014), Hypertension, Occipital neuralgia, PCOS (polycystic ovarian syndrome), PONV (postoperative nausea and vomiting), and Upper airway cough syndrome (08/17/2015). Also,  has a past surgical history that includes Cholecystectomy; Laparoscopic cholecystectomy (06/2009); STENT PLACE LEFT URETER ([facility] HX) (Left, 11/2012); Colonoscopy (09/2014); Cesarean section (N/A, 06/22/2016); Dilation and curettage of uterus (2013); Hysteroscopy (2015); hemmorhoidectomy (2012); Eye surgery; Esophagogastroduodenoscopy endoscopy; Achilles tendon surgery (Left, 04/20/2018); Ostectomy (Left, 04/20/2018); LASIK (Bilateral); Endoscopic turbinate reduction (Bilateral, 07/17/2019); Maxillary antrostomy (Right, 07/17/2019); and Balloon sinuplasty (Left, 07/17/2019)., family history includes Bladder Cancer in her maternal grandfather; Breast cancer in her maternal grandmother; Diabetes in her maternal grandmother; Heart disease in her father and maternal grandfather; Hyperlipidemia in her father; Hypertension in her brother and mother; Kidney Stones in her mother.,   reports that she has never smoked. She has never used smokeless tobacco. She reports previous alcohol use. She reports that she does not use drugs.  She has a current medication list which includes the following prescription(s): gabapentin, krill oil, multiple vitamins-minerals, amoxicillin-clavulanate, vitamin d3, epinephrine, fluoxetine, fluticasone, gianvi, hydrochlorothiazide, hydrocodone-acetaminophen, melatonin, metformin, ondansetron, prednisone, proctozone-hc, vitamin b-12, and vitamin c. Also, is allergic to levofloxacin, reglan [metoclopramide], other, almond (diagnostic), bactrim [sulfamethoxazole-trimethoprim], equal [aspartame], and sulfa antibiotics.  Review of Systems  Constitutional: Positive for malaise/fatigue. Negative for chills and fever.  HENT: Negative for congestion, sinus pain and sore throat.   Eyes: Negative for blurred vision and pain.  Respiratory: Negative for cough and wheezing.   Cardiovascular: Negative for chest pain and leg swelling.  Gastrointestinal: Negative for abdominal pain, constipation, diarrhea, heartburn, nausea and vomiting.  Genitourinary: Negative for dysuria, frequency, hematuria and urgency.  Musculoskeletal: Negative for back pain, joint pain, myalgias and neck pain.  Skin: Negative for itching and rash.  Neurological: Positive for dizziness. Negative for tremors and weakness.  Endo/Heme/Allergies: Does not bruise/bleed easily.  Psychiatric/Behavioral: Negative for depression. The patient is nervous/anxious. The patient does not have insomnia.     Objective: Vitals:   07/10/20 1427  BP: 140/90   Physical Exam Constitutional:      General: She is not in acute distress.    Appearance: She is well-developed. She is obese.  Genitourinary:     Pelvic exam was performed with patient supine.     Vagina and uterus normal.     No vaginal erythema or bleeding.     No cervical motion tenderness, discharge, polyp or nabothian cyst.     Uterus is  mobile.     Uterus is not enlarged.     No uterine mass detected.    Uterus is midaxial.     No right or left adnexal mass present.  Right adnexa not tender.     Left adnexa not tender.     Genitourinary Comments: Cervix closed Brown discharge in vagina c/w old blood Min T uterus, difficult to assess size  HENT:     Head: Normocephalic and atraumatic.     Nose: Nose normal.  Abdominal:     General: There is no distension.     Palpations: Abdomen is soft.     Tenderness: There is no abdominal tenderness.  Musculoskeletal:        General: Normal range of motion.  Neurological:     Mental Status: She is alert and oriented to person, place, and time.     Cranial Nerves: No cranial nerve deficit.  Skin:    General: Skin is warm and dry.  Psychiatric:        Attention and Perception: Attention normal.        Mood and Affect: Mood and affect normal.        Speech: Speech normal.        Behavior: Behavior normal.        Thought Content: Thought content normal.        Judgment: Judgment normal.     Assessment: 34 y.o. [id number]  1. First trimester bleeding - Beta hCG quant (ref lab) today - Beta hCG quant (ref lab) Monday  2. PCOS (polycystic ovarian syndrome)  Plan: 1) First trimester bleeding - incidence and clinical course of first trimester bleeding is discussed in detail with the patient today.  Approximately 1/3 of pregnancies ending in live births experienced 1st trimester bleeding.  The amount of bleeding is variable and not necessarily predictive of outcome.  Sources may be cervical or uterine.  Subchorionic hemorrhages are a frequent concurrent findings on ultrasound and are followed expectantly.  These often absorb or regress spontaneously although risk for expansion and further disruption of the utero-placental interface leading to miscarriage is possible.  There is no clearly documented benefit to limiting or modifying activity and sexual intercourse in altering clinic  course of 1st trimester bleeding.    2) If not already done will proceed with TVUS evaluation (based on beta levels) to document viability, and if uncertain viability or absence of a demonstrable IUP (and no previous documentation of IUP) will trend HCG levels.  3) The patient is Rh NEG, rhogam is therefore  indicated to decrease the risk rhesus alloimmunization.  Will administer once we see BETA and pregnancy situation  4) Routine bleeding precautions were discussed with the patient prior the conclusion of today's visit.  5) High risk factors to consider: prior CS, prior preeclampsia, cHTN now on HCTZ  Annamarie Major, MD, [facility], [facility] 07/10/2020  2:49 PM

## 2020-07-10 NOTE — Telephone Encounter (Signed)
Patient is scheduled for NOB 07/22/20. Today, patient is calling estimating about 7-[redacted] weeks pregnant and light spotting and cramping. Please advise

## 2020-07-11 LAB — BETA HCG QUANT (REF LAB): hCG Quant: 3761 m[IU]/mL

## 2020-07-13 ENCOUNTER — Other Ambulatory Visit: Payer: Self-pay | Admitting: Obstetrics & Gynecology

## 2020-07-13 ENCOUNTER — Ambulatory Visit (INDEPENDENT_AMBULATORY_CARE_PROVIDER_SITE_OTHER): Payer: BC Managed Care – PPO | Admitting: Obstetrics & Gynecology

## 2020-07-13 ENCOUNTER — Ambulatory Visit (INDEPENDENT_AMBULATORY_CARE_PROVIDER_SITE_OTHER): Payer: BC Managed Care – PPO

## 2020-07-13 ENCOUNTER — Other Ambulatory Visit: Payer: Self-pay

## 2020-07-13 ENCOUNTER — Other Ambulatory Visit: Payer: BC Managed Care – PPO

## 2020-07-13 ENCOUNTER — Telehealth: Payer: Self-pay | Admitting: Obstetrics & Gynecology

## 2020-07-13 ENCOUNTER — Encounter: Payer: Self-pay | Admitting: Obstetrics & Gynecology

## 2020-07-13 DIAGNOSIS — Z3A00 Weeks of gestation of pregnancy not specified: Secondary | ICD-10-CM | POA: Diagnosis not present

## 2020-07-13 DIAGNOSIS — O209 Hemorrhage in early pregnancy, unspecified: Secondary | ICD-10-CM

## 2020-07-13 DIAGNOSIS — O00102 Left tubal pregnancy without intrauterine pregnancy: Secondary | ICD-10-CM | POA: Diagnosis not present

## 2020-07-13 LAB — BETA HCG QUANT (REF LAB): hCG Quant: 4467 m[IU]/mL

## 2020-07-13 NOTE — Patient Instructions (Signed)
Methotrexate Treatment for an Ectopic Pregnancy  Methotrexate is a medicine that treats an ectopic pregnancy. An ectopic pregnancy is a pregnancy in which the fetus develops outside the uterus. This kind of pregnancy can be dangerous. Methotrexate works by stopping the growth of the fertilized egg. It also helps your body absorb tissue from the egg. This takes between 2-6 weeks. Most ectopic pregnancies can be successfully treated with methotrexate if they are diagnosed early. Tell a health care provider about:  Any allergies you have.  All medicines you are taking, including vitamins, herbs, eye drops, creams, and over-the-counter medicines.  Any medical conditions you have. What are the risks? Generally, this is a safe treatment. However, problems may occur, including:  Nausea or vomiting or both.  Vaginal bleeding or spotting.  Diarrhea.  Abdominal cramping.  Dizziness or feeling lightheaded.  Mouth sores.  Swelling or irritation of the lining of your lungs (pneumonitis).  Liver damage.  Hair loss. There is a risk that methotrexate treatment will fail and your pregnancy will continue. There is also a risk that the ectopic pregnancy might rupture while you are using this medicine. What happens before the procedure?  Liver tests, kidney tests, and a complete blood test will be done.  Blood tests will be done to measure the pregnancy hormone levels and to determine your blood type.  If you are Rh-negative and the father is Rh-positive or his Rh type is not known, you will be given a Rho (D) immune globulin shot. What happens during the procedure? Your health care provider may give you methotrexate by injection or in the form of a pill. Methotrexate may be given as a single dose of medicine or a series of doses, depending on your response to the treatment.  Methotrexate injections will be given by your health care provider. This is the most common way that methotrexate is used  to treat an ectopic pregnancy.  If you are prescribed oral methotrexate, it is very important that you follow your health care provider's instructions on how to take oral methotrexate. Additional medicines may be needed to manage an ectopic pregnancy. The procedure may vary among health care providers and hospitals. What happens after the procedure?  You may have abdominal cramping, vaginal bleeding, and fatigue.  Blood tests will be taken at timed intervals for several days or weeks to check your pregnancy hormone levels. The blood tests will be done until the pregnancy hormone can no longer be detected in the blood.  You may need to have a surgical procedure to remove the ectopic pregnancy if methotrexate treatment fails.  Follow instructions from your health care provider on how and when to report any symptoms that may indicate a ruptured ectopic pregnancy. Summary  Methotrexate is a medicine that treats an ectopic pregnancy.  Methotrexate may be given in a single dose or a series of doses over time.  Blood tests will be taken at timed intervals for several days or weeks to check your pregnancy hormone levels. The blood tests will be done until no more pregnancy hormone is detected in the blood.  There is a risk that methotrexate treatment will fail and your pregnancy will continue. There is also a risk that the ectopic pregnancy might rupture while you are using this medicine. This information is not intended to replace advice given to you by your health care provider. Make sure you discuss any questions you have with your health care provider. Document Revised: 10/13/2017 Document Reviewed: 12/20/2016 Elsevier Patient   Education  2020 [employer]  

## 2020-07-13 NOTE — Telephone Encounter (Signed)
Called and left voicemail for patient to be aware we added a ultrasound appointment per [facility].. Dr. [name] could you please place ultrasound order for this patient

## 2020-07-13 NOTE — Telephone Encounter (Signed)
-----   Message from [name], MD sent at 07/11/2020  6:52 AM EDT ----- Add ultrasound to visit Monday 8/30

## 2020-07-13 NOTE — Progress Notes (Addendum)
HPI: Pt has early pregnancy bleeding. This worsened over the weekend.  Denies pain.  Beta HCG on Fri was 3700.  Ultrasound demonstrates gest sac vs pseudosac, also left adnexal cystic structure, see below  PMHx: She  has a past medical history of Abdominal pain, generalized (11/11/2014), Altered bowel function (11/11/2014), Anxiety, Bleeding per rectum (11/11/2014), Calculus of kidney (12/14/2015), Complication of anesthesia, Fatty infiltration of liver (11/11/2014), Hypertension, Occipital neuralgia, PCOS (polycystic ovarian syndrome), PONV (postoperative nausea and vomiting), and Upper airway cough syndrome (08/17/2015). Also,  has a past surgical history that includes Cholecystectomy; Laparoscopic cholecystectomy (06/2009); STENT PLACE LEFT URETER ([facility] HX) (Left, 11/2012); Colonoscopy (09/2014); Cesarean section (N/A, 06/22/2016); Dilation and curettage of uterus (2013); Hysteroscopy (2015); hemmorhoidectomy (2012); Eye surgery; Esophagogastroduodenoscopy endoscopy; Achilles tendon surgery (Left, 04/20/2018); Ostectomy (Left, 04/20/2018); LASIK (Bilateral); Endoscopic turbinate reduction (Bilateral, 07/17/2019); Maxillary antrostomy (Right, 07/17/2019); and Balloon sinuplasty (Left, 07/17/2019)., family history includes Bladder Cancer in her maternal grandfather; Breast cancer in her maternal grandmother; Diabetes in her maternal grandmother; Heart disease in her father and maternal grandfather; Hyperlipidemia in her father; Hypertension in her brother and mother; Kidney Stones in her mother.,  reports that she has never smoked. She has never used smokeless tobacco. She reports previous alcohol use. She reports that she does not use drugs.  She has a current medication list which includes the following prescription(s): krill oil, multiple vitamins-minerals, and ondansetron. Also, is allergic to levofloxacin, reglan [metoclopramide], other, almond (diagnostic), bactrim [sulfamethoxazole-trimethoprim], equal  [aspartame], and sulfa antibiotics.  Review of Systems  All other systems reviewed and are negative.   Objective: There were no vitals taken for this visit.  Physical examination Constitutional NAD, Conversant  Skin No rashes, lesions or ulceration.   Extremities: Moves all appropriately.  Normal ROM for age. No lymphadenopathy.  Neuro: Grossly intact  Psych: Oriented to PPT.  Normal mood. Normal affect.   US OB Transvaginal  Result Date: 07/13/2020 Patient Name: Melissa Martinez DOB: [birth year] MRN: [id number] ULTRASOUND REPORT Location: [facility] Date of Service: 07/13/2020 Indications: Dating and Viability Findings: There is a possible gestational sac vs. Pseudogestational sac seen within the uterus. Measuring 14.0 x 10.9 x 11.2 mm. No fetal pole, or yolk sac is seen within at this time. The endometrium is thick at 32.7 mm. There is a complex area in the left adnexa measuring 38.1 x 20.5 x 17.5 mm. There is a circumscribed anechoic cystic area within measuring 32.5 x 17.8 x 16.9 mm. This may be an ectopic gestational sac vs.a cyst. Right Ovary is normal in appearance. Left Ovary is normal appearance. Corpus luteal cyst:  Right ovary There is scant free fluid in the left adnexa. Impression: 1. There is a questionable gestational sac vs. pseudogestational sac seen within the uterus. No fetal pole nor yolk sac is seen within at this time. 2. Thick endometrium. 3. There is a complex area in the left adnexa within a cystic center. 4. Cannot rule out ectopic pregnancy in the left adnexa. 5. Normal ovaries. Right corpus luteum. Recommendations: 1.Clinical correlation with the patient's History and Physical Exam. 2. Beta hCG follow up and exams Deanna Artis, RT Review of ULTRASOUND.    I have personally reviewed images and report of recent ultrasound done at [facility].    Plan of management to be discussed with patient. Annamarie Major, MD, FACOG [facility], [facility] 07/13/2020   11:50 AM    Assessment:  First trimester bleeding  Beta today (stat) [place] (done)  Plan-  counseled as to risks of ectopic, miscarriage, early bleeding w normal pregnancy.  No pain, stable. If beta is decreasing, then likely miscarriage.  Expectant management discussed. If beta going up slightly, then higher consideration for ectopic.  Discussed MTX vs Surgery, and with left cystic structure may want to consider surgery to evaluate. If beta doubling up, then consider normal pregnancy as a possibility, vs ectopic.  Still may need dx lap to assess cystic structure and for ectopic.  Also can consider follow up US and beta levels.  Risks of ectopic on maternal health discussed.  A total of 20 minutes were spent face-to-face with the patient as well as preparation, review, communication, and documentation during this encounter.   Annamarie Major, MD, FACOG [facility], [facility] 07/13/2020  11:52 AM  ADDENDUM: Beta 4400 Likely ectopic, not a normal IUP Discussed MTX vs laparoscopy Prefers non invasive therapy MTX in am, labs again on Fri and Mon  Pt has been informed by self that a diagnosis of an ectopic pregnancy is strongly suspected.  She understands that there are both medical and surgical therapies for an ectopic pregnancy in this circumstance.  She realizes that a surgical therapy would remove the ectopic pregnancy and possibly the tube.  The other alternative is attempted medical therapy.  As with everything in life, there are risks and benefit to each choice.    Patient is counseled that by choosing medical therapy she may be able to avoid surgery. She is also counseled that the medical therapy may fail with a lack of response or increasing pain.  She may need emergency surgery.  Some of the more common side effects of methotrexate are nausea, irritation of the mouth or eyes and abdominal pain although there are rarer but more serious side effects.  She is counseled that  she will be required to be followed very closely with both exams and blood tests and she promises to honor this obligation.  She realizes that there can be no promise of future fertility and that there may be future ectopic pregnancies.  The success of medical treatment of ectopic pregnancy should be 90%.    PROTOCOL: Day 0-1: hCG, CBC, SGOT, BUN/Creat, Type Rh, poss D+C   Day 1: Methotrexate 50 mg/sq. meter IM, hCG Day 4: hCG,  Day 7: hCG.    If < 15% decline in hCG between day 4 and 7--repeat MTX in dose above X1, if >15% decline, follow until < .  Give RhoGAM if Rh neg. No ETOH or folic acid, no intercourse till hCG neg, BCP/barrier for 2 months after Rx.   Annamarie Major, MD, [facility], [facility] 07/13/2020  5:41 PM

## 2020-07-13 NOTE — Addendum Note (Signed)
Addended by: [name] on: 07/13/2020 05:47 PM   Modules accepted: Orders, SmartSet

## 2020-07-14 ENCOUNTER — Ambulatory Visit
Admission: RE | Admit: 2020-07-14 | Discharge: 2020-07-14 | Disposition: A | Discharge status: Home / Self Care | Payer: BC Managed Care – PPO | Source: Ambulatory Visit | Attending: Obstetrics & Gynecology | Admitting: Obstetrics & Gynecology

## 2020-07-14 DIAGNOSIS — O009 Unspecified ectopic pregnancy without intrauterine pregnancy: Secondary | ICD-10-CM | POA: Diagnosis not present

## 2020-07-14 DIAGNOSIS — Z3A00 Weeks of gestation of pregnancy not specified: Secondary | ICD-10-CM | POA: Diagnosis not present

## 2020-07-14 LAB — COMPREHENSIVE METABOLIC PANEL
ALT: 27 U/L (ref 0–44)
AST: 30 U/L (ref 15–41)
Albumin: 4.2 g/dL (ref 3.5–5.0)
Alkaline Phosphatase: 74 U/L (ref 38–126)
Anion gap: 9 (ref 5–15)
BUN: 11 mg/dL (ref 6–20)
CO2: 22 mmol/L (ref 22–32)
Calcium: 9.1 mg/dL (ref 8.9–10.3)
Chloride: 105 mmol/L (ref 98–111)
Creatinine, Ser: 0.57 mg/dL (ref 0.44–1.00)
GFR calc Af Amer: >60 mL/min (ref >60)
GFR calc non Af Amer: >60 mL/min (ref >60)
Glucose, Bld: 118 mg/dL — ABNORMAL HIGH (ref 70–99)
Potassium: 3.7 mmol/L (ref 3.5–5.1)
Sodium: 136 mmol/L (ref 135–145)
Total Bilirubin: 0.6 mg/dL (ref 0.3–1.2)
Total Protein: 8.5 g/dL — ABNORMAL HIGH (ref 6.5–8.1)

## 2020-07-14 LAB — CBC WITH DIFFERENTIAL/PLATELET
Abs Immature Granulocytes: 0.03 10*3/uL (ref 0.00–0.07)
Basophils Absolute: 0 10*3/uL (ref 0.0–0.1)
Basophils Relative: 1 %
Eosinophils Absolute: 0.2 10*3/uL (ref 0.0–0.5)
Eosinophils Relative: 3 %
HCT: 37.4 % (ref 36.0–46.0)
Hemoglobin: 12.2 g/dL (ref 12.0–15.0)
Immature Granulocytes: 0 %
Lymphocytes Relative: 23 %
Lymphs Abs: 1.6 10*3/uL (ref 0.7–4.0)
MCH: 27.4 pg (ref 26.0–34.0)
MCHC: 32.6 g/dL (ref 30.0–36.0)
MCV: 83.9 fL (ref 80.0–100.0)
Monocytes Absolute: 0.5 10*3/uL (ref 0.1–1.0)
Monocytes Relative: 7 %
Neutro Abs: 4.4 10*3/uL (ref 1.7–7.7)
Neutrophils Relative %: 66 %
Platelets: 327 10*3/uL (ref 150–400)
RBC: 4.46 MIL/uL (ref 3.87–5.11)
RDW: 14.1 % (ref 11.5–15.5)
WBC: 6.7 10*3/uL (ref 4.0–10.5)
nRBC: 0 % (ref 0.0–0.2)

## 2020-07-14 LAB — ANTIBODY SCREEN: Antibody Screen: NEGATIVE

## 2020-07-14 LAB — ABO/RH: ABO/RH(D): O NEG

## 2020-07-14 MED ORDER — METHOTREXATE FOR ECTOPIC PREGNANCY
50.0000 mg/m2 | Freq: Once | INTRAMUSCULAR | Status: AC
  Administered 2020-07-14: 120 mg via INTRAMUSCULAR
  Filled 2020-07-14: qty 1

## 2020-07-14 MED ORDER — RHO D IMMUNE GLOBULIN 1500 UNIT/2ML IJ SOSY
300.0000 ug | PREFILLED_SYRINGE | Freq: Once | INTRAMUSCULAR | Status: AC
  Administered 2020-07-14: 300 ug via INTRAMUSCULAR
  Filled 2020-07-14: qty 2

## 2020-07-14 NOTE — Telephone Encounter (Signed)
Please advise 

## 2020-07-15 LAB — RHOGAM INJECTION: Unit division: 0

## 2020-07-17 ENCOUNTER — Other Ambulatory Visit: Payer: Self-pay | Admitting: Obstetrics and Gynecology

## 2020-07-17 ENCOUNTER — Other Ambulatory Visit
Admission: RE | Admit: 2020-07-17 | Discharge: 2020-07-17 | Disposition: A | Discharge status: Home / Self Care | Payer: BC Managed Care – PPO | Source: Ambulatory Visit | Attending: Obstetrics & Gynecology | Admitting: Obstetrics & Gynecology

## 2020-07-17 ENCOUNTER — Ambulatory Visit
Admission: RE | Admit: 2020-07-17 | Discharge: 2020-07-17 | Disposition: A | Discharge status: Home / Self Care | Payer: BC Managed Care – PPO | Source: Ambulatory Visit | Attending: Obstetrics and Gynecology | Admitting: Obstetrics and Gynecology

## 2020-07-17 DIAGNOSIS — O00102 Left tubal pregnancy without intrauterine pregnancy: Secondary | ICD-10-CM | POA: Insufficient documentation

## 2020-07-17 LAB — HCG, QUANTITATIVE, PREGNANCY: hCG, Beta Chain, Quant, S: 2873 m[IU]/mL — ABNORMAL HIGH (ref <5)

## 2020-07-17 NOTE — Progress Notes (Signed)
hcg ordered.

## 2020-07-20 ENCOUNTER — Ambulatory Visit
Admission: RE | Admit: 2020-07-20 | Discharge: 2020-07-20 | Disposition: A | Discharge status: Home / Self Care | Payer: BC Managed Care – PPO | Source: Ambulatory Visit | Attending: Obstetrics and Gynecology | Admitting: Obstetrics and Gynecology

## 2020-07-20 ENCOUNTER — Other Ambulatory Visit: Payer: Self-pay

## 2020-07-20 DIAGNOSIS — O00102 Left tubal pregnancy without intrauterine pregnancy: Secondary | ICD-10-CM | POA: Insufficient documentation

## 2020-07-20 DIAGNOSIS — Z3A00 Weeks of gestation of pregnancy not specified: Secondary | ICD-10-CM | POA: Insufficient documentation

## 2020-07-20 LAB — CBC WITH DIFFERENTIAL/PLATELET
Abs Immature Granulocytes: 0.04 K/uL (ref 0.00–0.07)
Basophils Absolute: 0 K/uL (ref 0.0–0.1)
Basophils Relative: 1 %
Eosinophils Absolute: 0.2 K/uL (ref 0.0–0.5)
Eosinophils Relative: 3 %
HCT: 32.9 % — ABNORMAL LOW (ref 36.0–46.0)
Hemoglobin: 11.1 g/dL — ABNORMAL LOW (ref 12.0–15.0)
Immature Granulocytes: 1 %
Lymphocytes Relative: 37 %
Lymphs Abs: 3.2 K/uL (ref 0.7–4.0)
MCH: 27.9 pg (ref 26.0–34.0)
MCHC: 33.7 g/dL (ref 30.0–36.0)
MCV: 82.7 fL (ref 80.0–100.0)
Monocytes Absolute: 0.6 K/uL (ref 0.1–1.0)
Monocytes Relative: 7 %
Neutro Abs: 4.4 K/uL (ref 1.7–7.7)
Neutrophils Relative %: 51 %
Platelets: 352 K/uL (ref 150–400)
RBC: 3.98 MIL/uL (ref 3.87–5.11)
RDW: 14.1 % (ref 11.5–15.5)
WBC: 8.6 K/uL (ref 4.0–10.5)
nRBC: 0 % (ref 0.0–0.2)

## 2020-07-20 LAB — COMPREHENSIVE METABOLIC PANEL
ALT: 31 U/L (ref 0–44)
AST: 30 U/L (ref 15–41)
Albumin: 3.9 g/dL (ref 3.5–5.0)
Alkaline Phosphatase: 76 U/L (ref 38–126)
Anion gap: 9 (ref 5–15)
BUN: 14 mg/dL (ref 6–20)
CO2: 24 mmol/L (ref 22–32)
Calcium: 9.1 mg/dL (ref 8.9–10.3)
Chloride: 106 mmol/L (ref 98–111)
Creatinine, Ser: 0.53 mg/dL (ref 0.44–1.00)
GFR calc Af Amer: >60 mL/min (ref >60)
GFR calc non Af Amer: >60 mL/min (ref >60)
Glucose, Bld: 97 mg/dL (ref 70–99)
Potassium: 3.9 mmol/L (ref 3.5–5.1)
Sodium: 139 mmol/L (ref 135–145)
Total Bilirubin: 0.5 mg/dL (ref 0.3–1.2)
Total Protein: 7.8 g/dL (ref 6.5–8.1)

## 2020-07-20 LAB — HCG, QUANTITATIVE, PREGNANCY: hCG, Beta Chain, Quant, S: 305 m[IU]/mL — ABNORMAL HIGH

## 2020-07-20 NOTE — Progress Notes (Signed)
Lab at bedside to draw blood. Per Dr. [name], notify of results to determine if methotrexate needs to be given.

## 2020-07-20 NOTE — Progress Notes (Signed)
Pt here for blood draw per Dr. [name] order.

## 2020-07-20 NOTE — Progress Notes (Signed)
Lab results back and given to Dr. [name]. Per Dr. [name], patient does not need methotrexate at this time and is to follow up with his office as scheduled.

## 2020-07-21 NOTE — Progress Notes (Signed)
I'm sure you're aware of this. Not sure how I wound up with this.

## 2020-07-22 ENCOUNTER — Encounter: Payer: BC Managed Care – PPO | Admitting: Advanced Practice Midwife

## 2020-07-23 ENCOUNTER — Other Ambulatory Visit: Payer: Self-pay

## 2020-07-23 ENCOUNTER — Encounter: Payer: Self-pay | Admitting: Obstetrics & Gynecology

## 2020-07-23 ENCOUNTER — Other Ambulatory Visit: Payer: Self-pay | Admitting: Obstetrics & Gynecology

## 2020-07-23 ENCOUNTER — Ambulatory Visit (INDEPENDENT_AMBULATORY_CARE_PROVIDER_SITE_OTHER): Payer: BC Managed Care – PPO | Admitting: Obstetrics & Gynecology

## 2020-07-23 VITALS — BP 126/84 | Ht 68.0 in | Wt 261.0 lb

## 2020-07-23 DIAGNOSIS — O00102 Left tubal pregnancy without intrauterine pregnancy: Secondary | ICD-10-CM | POA: Diagnosis not present

## 2020-07-23 MED ORDER — DROSPIRENONE-ETHINYL ESTRADIOL 3-0.02 MG PO TABS: 1.0000 | ORAL_TABLET | Freq: Every day | ORAL | 11 refills | Status: DC

## 2020-07-23 NOTE — Progress Notes (Signed)
  History of Present Illness:  Melissa Martinez is a 34 y.o. who was started on MTX  For ectopic pregnancy approximately 1 weeks ago. Since that time, she states that her symptoms have improved, no further bleeding, betas have come down appropriately.  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 Also,  has a past surgical history that includes Cholecystectomy; Laparoscopic cholecystectomy (06/2009); STENT PLACE LEFT URETER ([facility] HX) (Left, 11/2012); Colonoscopy (09/2014); Cesarean section (N/A, 06/22/2016); Dilation and curettage of uterus (2013); Hysteroscopy (2015); hemmorhoidectomy (2012); Eye surgery; Esophagogastroduodenoscopy endoscopy; Achilles tendon surgery (Left, 04/20/2018); Ostectomy (Left, 04/20/2018); LASIK (Bilateral); Endoscopic turbinate reduction (Bilateral, 07/17/2019); Maxillary antrostomy (Right, 07/17/2019); and Balloon sinuplasty (Left, 07/17/2019)., family history includes Bladder Cancer in her maternal grandfather; Breast cancer in her maternal grandmother; Diabetes in her maternal grandmother; Heart disease in her father and maternal grandfather; Hyperlipidemia in her father; Hypertension in her brother and mother; Kidney Stones in her mother.,  reports that she has never smoked. She has never used smokeless tobacco. She reports previous alcohol use. She reports that she does not use drugs. No outpatient medications have been marked as taking for the 07/23/20 encounter (Office Visit) with [name], MD.  . Also, is  allergic to levofloxacin, reglan [metoclopramide], other, almond (diagnostic), bactrim [sulfamethoxazole-trimethoprim], equal [aspartame], and sulfa antibiotics..  Review of Systems  All other systems reviewed and are negative.   Physical Exam:  BP 126/84   Ht 5\' 8"  (1.727 m)   Wt 261 lb (118.4 kg)   BMI 39.68 kg/m  Body mass index is 39.68 kg/m. Constitutional: Well nourished, well developed female in no acute distress.  Abdomen: diffusely non tender to palpation, non distended, and no masses, hernias Neuro: Grossly intact Psych:  Normal mood and affect.    Assessment:    ICD-10-CM   1. Left tubal pregnancy without intrauterine pregnancy  O00.102   Resolved w MTX tx  OCP today to start Sunday Cont PNV Try for pregnancy again when ready Counseling offered, she gets this thru work. Has continued to take Prozac as well RTW and school.  Maintain reduced hours at work thru Dec   A total of 20 minutes were spent face-to-face with the patient as well as preparation, review, communication, and documentation during this encounter.   Jan, MD, [facility], [facility] 07/23/2020  1:42 PM

## 2020-09-01 ENCOUNTER — Encounter: Payer: Self-pay | Admitting: Physician Assistant

## 2020-09-01 ENCOUNTER — Ambulatory Visit
Admission: RE | Admit: 2020-09-01 | Discharge: 2020-09-01 | Disposition: A | Discharge status: Home / Self Care | Payer: BC Managed Care – PPO | Source: Ambulatory Visit | Attending: Physician Assistant | Admitting: Physician Assistant

## 2020-09-01 ENCOUNTER — Telehealth: Payer: Self-pay | Admitting: Physician Assistant

## 2020-09-01 ENCOUNTER — Other Ambulatory Visit: Payer: Self-pay | Admitting: Physician Assistant

## 2020-09-01 ENCOUNTER — Ambulatory Visit (INDEPENDENT_AMBULATORY_CARE_PROVIDER_SITE_OTHER): Payer: BC Managed Care – PPO | Admitting: Physician Assistant

## 2020-09-01 ENCOUNTER — Ambulatory Visit
Admission: RE | Admit: 2020-09-01 | Discharge: 2020-09-01 | Disposition: A | Discharge status: Home / Self Care | Payer: BC Managed Care – PPO | Attending: Physician Assistant | Admitting: Physician Assistant

## 2020-09-01 ENCOUNTER — Other Ambulatory Visit: Payer: Self-pay

## 2020-09-01 ENCOUNTER — Other Ambulatory Visit
Admission: RE | Admit: 2020-09-01 | Discharge: 2020-09-01 | Disposition: A | Discharge status: Home / Self Care | Payer: BC Managed Care – PPO | Source: Home / Self Care | Attending: Physician Assistant | Admitting: Physician Assistant

## 2020-09-01 VITALS — BP 151/92 | HR 93 | Ht 68.0 in | Wt 243.4 lb

## 2020-09-01 DIAGNOSIS — Z87442 Personal history of urinary calculi: Secondary | ICD-10-CM

## 2020-09-01 DIAGNOSIS — R109 Unspecified abdominal pain: Secondary | ICD-10-CM

## 2020-09-01 DIAGNOSIS — N200 Calculus of kidney: Secondary | ICD-10-CM | POA: Insufficient documentation

## 2020-09-01 LAB — PREGNANCY, URINE: Preg Test, Ur: NEGATIVE

## 2020-09-01 NOTE — Telephone Encounter (Signed)
I just spoke with the patient via telephone.  I explained that her CT stone study was negative for acute stone episode.  I explained that she does have a punctate, nonobstructing left renal stone that does not require treatment at this time.  Ultimately, I do not believe her symptoms are urologic in nature and I counseled her to follow-up with her primary care provider for further evaluation.  She reiterated her pain with urination.  I explained that I would be sending her urine for culture today for further evaluation of possible UTI and would treat her accordingly.  She expressed understanding.

## 2020-09-01 NOTE — Progress Notes (Signed)
09/01/2020 11:03 AM   Melissa Martinez [birth year] [id number]  CC: Chief Complaint  Patient presents with  . Nephrolithiasis    HPI: Melissa Martinez is a 34 y.o. female with a remote history of nephrolithiasis requiring URS/stent who presents today for evaluation of possible acute stone episode.  She was seen in clinic most recently by Melissa Martinez on 08/23/2018 for the same.  Urine culture negative at the time, KUB with a pelvic calcification consistent with distal ureteral stone versus phlebolith.  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  She reports cramping lower abdominal pain before and after urination and constant bladder burning.  She describes the pain as a constant 5/10 in severity with occasional twinges of right flank pain increasing to 7-8/10 in severity.  She took Azo last night with no improvement in her symptoms.  She took Percocet 5-325 mL around 0740 this morning which did help the pain.  She reports some nausea and anorexia with diaphoresis.  She denies fever, chills, and vomiting.  KUB today with no clear urolithiasis.  There is a stable calcification in the left hemipelvis consistent with phlebolith.  Notably, she was treated for ectopic pregnancy approximately 6 weeks ago.  LMP 08/18/2020.  In-office UA and microscopy today pan negative.  PMH: Past Medical History:  Diagnosis Date  . Abdominal pain, generalized 11/11/2014  . Altered bowel function 11/11/2014  . Anxiety    tkes Prozac, for OCD related anxiety  . Bleeding per rectum 11/11/2014  . Calculus of kidney 12/14/2015  . Complication of anesthesia    sensitive / stops breathing  . Fatty infiltration of liver 11/11/2014  . Hypertension   . Occipital neuralgia   . PCOS (polycystic ovarian syndrome)   . PONV (postoperative nausea and vomiting)   . Upper airway cough syndrome 08/17/2015   Followed  in Pulmonary clinic/ [facility]/ [facility]      Surgical History: Past Surgical History:  Procedure Laterality Date  . ACHILLES TENDON SURGERY Left 04/20/2018   Procedure: ACHILLES TENDON REPAIR-SECONDARY;  Surgeon: Gwyneth Revels, DPM;  Location: [facility] ORS;  Service: Podiatry;  Laterality: Left;  . BALLOON SINUPLASTY Left 07/17/2019   Procedure: BALLOON SINUPLASTY;  Surgeon: Bud Face, MD;  Location: [facility];  Service: ENT;  Laterality: Left;  . CESAREAN SECTION N/A 06/22/2016   Procedure: CESAREAN SECTION;  Surgeon: [name], MD;  Location: [facility] ORS;  Service: Obstetrics;  Laterality: N/A;  . CHOLECYSTECTOMY    . COLONOSCOPY  09/2014  . DILATION AND CURETTAGE OF UTERUS  2013  . ENDOSCOPIC TURBINATE REDUCTION Bilateral 07/17/2019   Procedure: ENDOSCOPIC TURBINATE REDUCTION;  Surgeon: Bud Face, MD;  Location: [facility];  Service: ENT;  Laterality: Bilateral;  NEED STRYKER DISK GAVE DISK TO BRENDA 8-20  KP rep coming Gwenyth Allegra  . ESOPHAGOGASTRODUODENOSCOPY ENDOSCOPY    . EYE SURGERY     lasik  . hemmorhoidectomy  2012  . HYSTEROSCOPY  2015  . LAPAROSCOPIC CHOLECYSTECTOMY  06/2009  . LASIK Bilateral   . MAXILLARY ANTROSTOMY Right 07/17/2019   Procedure: MAXILLARY ANTROSTOMY;  Surgeon: Bud Face, MD;  Location: [facility];  Service: ENT;  Laterality: Right;  . OSTECTOMY Left 04/20/2018   Procedure: OSTECTOMY-HAGLUNDS/RETROCALCANEAL;  Surgeon: Gwyneth Revels, DPM;  Location: [facility] ORS;  Service: Podiatry;  Laterality: Left;  . STENT PLACE LEFT URETER ([facility] HX) Left 11/2012  has had 2 stones pass stones    Home Medications:  Allergies as of 09/01/2020      Reactions   Levofloxacin Anaphylaxis, Itching, Other (See Comments)   Throat tightening, generalized itching, redness   Reglan [metoclopramide] Anxiety   Causes paranoia    Other Other (See Comments)   splenda and equal- itching, hives   Almond (diagnostic) Hives    Bactrim [sulfamethoxazole-trimethoprim] Itching   Also redness   Equal [aspartame] Itching   Sulfa Antibiotics Other (See Comments)   Redness, hives, itching , skin felt on fire      Medication List       Accurate as of September 01, 2020 11:03 AM. If you have any questions, ask your nurse or doctor.        drospirenone-ethinyl estradiol 3-0.02 MG tablet Commonly known as: Nikki Take 1 tablet by mouth daily.   KRILL OIL PO Take 1 capsule by mouth daily.   WOMENS MULTIVITAMIN PO Take by mouth.       Allergies:  Allergies  Allergen Reactions  . Levofloxacin Anaphylaxis, Itching and Other (See Comments)    Throat tightening, generalized itching, redness  . Reglan [Metoclopramide] Anxiety    Causes paranoia   . Other Other (See Comments)    splenda and equal- itching, hives  . Almond (Diagnostic) Hives  . Bactrim [Sulfamethoxazole-Trimethoprim] Itching    Also redness  . Equal [Aspartame] Itching  . Sulfa Antibiotics Other (See Comments)    Redness, hives, itching , skin felt on fire    Family History: Family History  Problem Relation Age of Onset  . Heart disease Father   . Hyperlipidemia Father   . Breast cancer Maternal Grandmother   . Diabetes Maternal Grandmother   . Bladder Cancer Maternal Grandfather   . Heart disease Maternal Grandfather   . Kidney Stones Mother   . Hypertension Mother   . Hypertension Brother   . Kidney cancer Neg Hx     Social History:   reports that she has never smoked. She has never used smokeless tobacco. She reports previous alcohol use. She reports that she does not use drugs.  Physical Exam: BP (!) 151/92   Pulse 93   Ht 5\' 8"  (1.727 m)   Wt 243 lb 6.4 oz (110.4 kg)   LMP 08/16/2020 (Within Days)   BMI 37.01 kg/m   Constitutional:  Alert and oriented, uncomfortable appearing, hunched over in chair HEENT: Golden, AT Cardiovascular: No clubbing, cyanosis, or edema Respiratory: Normal respiratory effort, no increased work  of breathing Skin: No rashes, bruises or suspicious lesions Neurologic: Grossly intact, no focal deficits, moving all 4 extremities Psychiatric: Normal mood and affect  Laboratory Data: Results for orders placed or performed in visit on 09/01/20  CULTURE, URINE COMPREHENSIVE   Specimen: Urine   UR  Result Value Ref Range   Urine Culture, Comprehensive Preliminary report    Organism ID, Bacteria Comment   Microscopic Examination   Urine  Result Value Ref Range   WBC, UA 0-5 0 - 5 /hpf   RBC 0-2 0 - 2 /hpf   Epithelial Cells (non renal) 0-10 0 - 10 /hpf   Bacteria, UA None seen None seen/Few  Urinalysis, Complete  Result Value Ref Range   Specific Gravity, UA 1.025 1.005 - 1.030   pH, UA 5.5 5.0 - 7.5   Color, UA Yellow Yellow   Appearance Ur Clear Clear   Leukocytes,UA Negative Negative   Protein,UA Negative Negative/Trace   Glucose, UA  Negative Negative   Ketones, UA Negative Negative   RBC, UA Negative Negative   Bilirubin, UA Negative Negative   Urobilinogen, Ur 0.2 0.2 - 1.0 mg/dL   Nitrite, UA Negative Negative   Microscopic Examination See below:    Pertinent Imaging: KUB, 09/01/2020: CLINICAL DATA:  Flank pain  EXAM: ABDOMEN - 1 VIEW  COMPARISON:  08/23/2018  FINDINGS: Surgical clips in the right upper quadrant. Nonobstructed gas pattern with moderate stool. Punctate phleboliths left pelvis. No radiopaque calculi.  IMPRESSION: Nonobstructed gas pattern with moderate stool.   Electronically Signed   By: Jasmine Pang M.D.   On: 09/01/2020 15:59  I personally reviewed the images referenced above and note no clear ureteral stones.  Assessment & Plan:   1. Flank pain with history of urolithiasis Benign UA and reassuring KUB today, VSS.  Will send urine for culture and obtain STAT CT stone study for further evaluation of her symptoms with urine pregnancy test prior.  Counseled patient that I would contact her with her results.  Differential at this  time includes renal colic versus acute back pain. - Urinalysis, Complete - CT RENAL STONE STUDY; Future - Pregnancy, urine; Future   Return for Will call with results.  Carman Ching, PA-C  [facility] [address] [place], [place] [address] [phone]

## 2020-09-02 LAB — URINALYSIS, COMPLETE
Bilirubin, UA: NEGATIVE
Glucose, UA: NEGATIVE
Ketones, UA: NEGATIVE
Leukocytes,UA: NEGATIVE
Nitrite, UA: NEGATIVE
Protein,UA: NEGATIVE
RBC, UA: NEGATIVE
Specific Gravity, UA: 1.025 (ref 1.005–1.030)
Urobilinogen, Ur: 0.2 mg/dL (ref 0.2–1.0)
pH, UA: 5.5 (ref 5.0–7.5)

## 2020-09-02 LAB — MICROSCOPIC EXAMINATION: Bacteria, UA: NONE SEEN

## 2020-09-04 LAB — CULTURE, URINE COMPREHENSIVE

## 2020-09-07 ENCOUNTER — Telehealth: Payer: Self-pay | Admitting: Physician Assistant

## 2020-09-07 NOTE — Telephone Encounter (Signed)
Please contact the patient and inform her that her urine cultures come back negative.  At this point, we have ruled out urologic sources of her pain.  If her pain persists, I recommend she follow-up with her PCP.

## 2020-09-07 NOTE — Telephone Encounter (Signed)
Notified patient as instructed, patient was very upset .

## 2020-09-08 ENCOUNTER — Other Ambulatory Visit: Payer: Self-pay | Admitting: Family Medicine

## 2020-09-29 ENCOUNTER — Other Ambulatory Visit: Payer: Self-pay | Admitting: "Endocrinology

## 2020-11-26 ENCOUNTER — Other Ambulatory Visit: Payer: Self-pay | Admitting: Family Medicine

## 2020-11-26 DIAGNOSIS — Z20822 Contact with and (suspected) exposure to covid-19: Secondary | ICD-10-CM | POA: Diagnosis not present

## 2020-11-26 DIAGNOSIS — J40 Bronchitis, not specified as acute or chronic: Secondary | ICD-10-CM | POA: Diagnosis not present

## 2020-12-31 ENCOUNTER — Other Ambulatory Visit: Payer: Self-pay | Admitting: Otolaryngology

## 2021-01-05 ENCOUNTER — Other Ambulatory Visit: Payer: Self-pay | Admitting: Family Medicine

## 2021-01-05 DIAGNOSIS — E119 Type 2 diabetes mellitus without complications: Secondary | ICD-10-CM | POA: Diagnosis not present

## 2021-01-05 DIAGNOSIS — Z0000 Encounter for general adult medical examination without abnormal findings: Secondary | ICD-10-CM | POA: Diagnosis not present

## 2021-01-05 DIAGNOSIS — R7303 Prediabetes: Secondary | ICD-10-CM | POA: Diagnosis not present

## 2021-01-05 DIAGNOSIS — I10 Essential (primary) hypertension: Secondary | ICD-10-CM | POA: Diagnosis not present

## 2021-01-05 DIAGNOSIS — E282 Polycystic ovarian syndrome: Secondary | ICD-10-CM | POA: Diagnosis not present

## 2021-01-21 ENCOUNTER — Other Ambulatory Visit: Payer: Self-pay

## 2021-01-21 DIAGNOSIS — R5381 Other malaise: Secondary | ICD-10-CM | POA: Diagnosis present

## 2021-01-21 DIAGNOSIS — I10 Essential (primary) hypertension: Secondary | ICD-10-CM | POA: Diagnosis not present

## 2021-01-21 LAB — COMPREHENSIVE METABOLIC PANEL
ALT: 23 U/L (ref 0–44)
AST: 23 U/L (ref 15–41)
Albumin: 3.9 g/dL (ref 3.5–5.0)
Alkaline Phosphatase: 99 U/L (ref 38–126)
Anion gap: 11 (ref 5–15)
BUN: 9 mg/dL (ref 6–20)
CO2: 24 mmol/L (ref 22–32)
Calcium: 9.7 mg/dL (ref 8.9–10.3)
Chloride: 102 mmol/L (ref 98–111)
Creatinine, Ser: 0.62 mg/dL (ref 0.44–1.00)
GFR, Estimated: >60 mL/min (ref >60)
Glucose, Bld: 111 mg/dL — ABNORMAL HIGH (ref 70–99)
Potassium: 3.7 mmol/L (ref 3.5–5.1)
Sodium: 137 mmol/L (ref 135–145)
Total Bilirubin: 0.7 mg/dL (ref 0.3–1.2)
Total Protein: 8.4 g/dL — ABNORMAL HIGH (ref 6.5–8.1)

## 2021-01-21 LAB — URINALYSIS, ROUTINE W REFLEX MICROSCOPIC
Bilirubin Urine: NEGATIVE
Glucose, UA: NEGATIVE mg/dL
Hgb urine dipstick: NEGATIVE
Ketones, ur: NEGATIVE mg/dL
Leukocytes,Ua: NEGATIVE
Nitrite: NEGATIVE
Protein, ur: NEGATIVE mg/dL
Specific Gravity, Urine: 1.012 (ref 1.005–1.030)
pH: 5 (ref 5.0–8.0)

## 2021-01-21 LAB — CBC WITH DIFFERENTIAL/PLATELET
Abs Immature Granulocytes: 0.03 10*3/uL (ref 0.00–0.07)
Basophils Absolute: 0.1 10*3/uL (ref 0.0–0.1)
Basophils Relative: 1 %
Eosinophils Absolute: 0.3 10*3/uL (ref 0.0–0.5)
Eosinophils Relative: 3 %
HCT: 37 % (ref 36.0–46.0)
Hemoglobin: 12.2 g/dL (ref 12.0–15.0)
Immature Granulocytes: 0 %
Lymphocytes Relative: 34 %
Lymphs Abs: 3.5 10*3/uL (ref 0.7–4.0)
MCH: 27.7 pg (ref 26.0–34.0)
MCHC: 33 g/dL (ref 30.0–36.0)
MCV: 84.1 fL (ref 80.0–100.0)
Monocytes Absolute: 0.6 10*3/uL (ref 0.1–1.0)
Monocytes Relative: 6 %
Neutro Abs: 6 10*3/uL (ref 1.7–7.7)
Neutrophils Relative %: 56 %
Platelets: 391 10*3/uL (ref 150–400)
RBC: 4.4 MIL/uL (ref 3.87–5.11)
RDW: 12.8 % (ref 11.5–15.5)
WBC: 10.5 10*3/uL (ref 4.0–10.5)
nRBC: 0 % (ref 0.0–0.2)

## 2021-01-21 LAB — POC URINE PREG, ED: Preg Test, Ur: NEGATIVE

## 2021-01-21 LAB — TROPONIN I (HIGH SENSITIVITY)
Troponin I (High Sensitivity): 2 ng/L (ref <18)
Troponin I (High Sensitivity): <2 ng/L (ref <18)

## 2021-01-21 NOTE — ED Triage Notes (Signed)
Reports HTN at home with hx of same. Pt took 25 mg of Hydralazine without improvement. Reports nausea x2 weeks and pt states this is what happened last time she was pregnant, but has had 2 negative home tests. Reports near syncope tonight with h/a, dizziness, and "bright lights" in peripheral. Patient alert and oriented. Breathing unlabored speaking in full sentences.

## 2021-01-22 ENCOUNTER — Other Ambulatory Visit: Payer: Self-pay | Admitting: Family Medicine

## 2021-01-22 ENCOUNTER — Emergency Department
Admission: EM | Admit: 2021-01-22 | Discharge: 2021-01-22 | Disposition: A | Discharge status: Home / Self Care | Payer: 59 | Attending: Student in an Organized Health Care Education/Training Program | Admitting: Student in an Organized Health Care Education/Training Program

## 2021-01-22 DIAGNOSIS — R110 Nausea: Secondary | ICD-10-CM | POA: Diagnosis not present

## 2021-01-22 DIAGNOSIS — R42 Dizziness and giddiness: Secondary | ICD-10-CM | POA: Diagnosis not present

## 2021-01-22 DIAGNOSIS — I10 Essential (primary) hypertension: Secondary | ICD-10-CM

## 2021-01-22 DIAGNOSIS — R5383 Other fatigue: Secondary | ICD-10-CM | POA: Diagnosis not present

## 2021-01-22 DIAGNOSIS — Z03818 Encounter for observation for suspected exposure to other biological agents ruled out: Secondary | ICD-10-CM | POA: Diagnosis not present

## 2021-01-22 NOTE — ED Notes (Signed)
Dr Robinson at bedside 

## 2021-01-22 NOTE — ED Provider Notes (Signed)
[facility] Emergency Department Provider Note    Event Date/Time   First MD Initiated Contact with Patient 01/22/21 0308     (approximate)  I have reviewed the triage vital signs and the nursing notes.   HISTORY  Chief Complaint Hypertension and Loss of Consciousness    HPI Melissa Martinez is a 35 y.o. female below listed past medical history presents to the ER for evaluation of generalized malaise as well as is concern for elevated blood pressure and seeing spots today.  Previously off of her antihypertensive medication check blood pressure today was elevated.  Took her HCTZ and presented to the ER after the blood pressure remained elevated several hours after taking medication.  Did have a mild headache.  Not sudden in onset.  No fevers.  No chest pain or shortness of breath.  No nausea or vomiting.  No numbness or tingling.  Blood pressure has improved since arriving to the ER.  She feels at her baseline.    Past Medical History:  Diagnosis Date  . Abdominal pain, generalized 11/11/2014  . Altered bowel function 11/11/2014  . Anxiety    tkes Prozac, for OCD related anxiety  . Bleeding per rectum 11/11/2014  . Calculus of kidney 12/14/2015  . Complication of anesthesia    sensitive / stops breathing  . Fatty infiltration of liver 11/11/2014  . Hypertension   . Occipital neuralgia   . PCOS (polycystic ovarian syndrome)   . PONV (postoperative nausea and vomiting)   . Upper airway cough syndrome 08/17/2015   Followed in Pulmonary clinic/ [facility]/ [facility]     Family History  Problem Relation Age of Onset  . Heart disease Father   . Hyperlipidemia Father   . Breast cancer Maternal Grandmother   . Diabetes Maternal Grandmother   . Bladder Cancer Maternal Grandfather   . Heart disease Maternal Grandfather   . Kidney Stones Mother   . Hypertension Mother   . Hypertension Brother   . Kidney cancer Neg Hx    Past Surgical History:  Procedure  Laterality Date  . ACHILLES TENDON SURGERY Left 04/20/2018   Procedure: ACHILLES TENDON REPAIR-SECONDARY;  Surgeon: Gwyneth Revels, DPM;  Location: [facility] OR[facility];  Service: Podiatry;  Laterality: Left;  . BALLOON SINUPLASTY Left 07/17/2019   Procedure: BALLOON SINUPLASTY;  Surgeon: Bud Face, MD;  Location: [facility];  Service: ENT;  Laterality: Left;  . CESAREAN SECTION N/A 06/22/2016   Procedure: CESAREAN SECTION;  Surgeon: [name], MD;  Location: [facility] ORS;  Service: Obstetrics;  Laterality: N/A;  . CHOLECYSTECTOMY    . COLONOSCOPY  09/2014  . DILATION AND CURETTAGE OF UTERUS  2013  . ENDOSCOPIC TURBINATE REDUCTION Bilateral 07/17/2019   Procedure: ENDOSCOPIC TURBINATE REDUCTION;  Surgeon: Bud Face, MD;  Location: [facility];  Service: ENT;  Laterality: Bilateral;  NEED STRYKER DISK GAVE DISK TO BRENDA 8-20  KP rep coming Gwenyth Allegra  . ESOPHAGOGASTRODUODENOSCOPY ENDOSCOPY    . EYE SURGERY     lasik  . hemmorhoidectomy  2012  . HYSTEROSCOPY  2015  . LAPAROSCOPIC CHOLECYSTECTOMY  06/2009  . LASIK Bilateral   . MAXILLARY ANTROSTOMY Right 07/17/2019   Procedure: MAXILLARY ANTROSTOMY;  Surgeon: Bud Face, MD;  Location: [facility];  Service: ENT;  Laterality: Right;  . OSTECTOMY Left 04/20/2018   Procedure: OSTECTOMY-HAGLUNDS/RETROCALCANEAL;  Surgeon: Gwyneth Revels, DPM;  Location: [facility] OR[facility];  Service: Podiatry;  Laterality: Left;  . STENT PLACE LEFT URETER ([facility] HX) Left  11/2012   has had 2 stones pass stones   Patient Active Problem List   Diagnosis Date Noted  . Allergy to almonds 08/23/2018  . Vaccine reaction, initial encounter 08/23/2018  . Allergic rhinitis 08/23/2018  . Chronic neck pain 04/16/2018  . Headache disorder 04/16/2018  . Asymptomatic hyperuricemia 02/05/2018  . Chronic pain of left heel 02/05/2018  . Screening for cervical cancer 08/01/2017  . Nausea 07/03/2017  . Dehydration 06/12/2017  . Adenitis 05/23/2017  .  Adenitis 05/23/2017  . Closed Colles' fracture of left radius 10/17/2016  . Kidney stones 12/14/2015  . PCOS (polycystic ovarian syndrome) 12/14/2015  . Upper airway cough syndrome 08/17/2015  . Upper airway cough syndrome 08/17/2015  . Abdominal pain, chronic, generalized 11/11/2014  . Bleeding per rectum 11/11/2014  . Change in bowel habits 11/11/2014  . Fatty infiltration of liver 11/11/2014  . Bright red rectal bleeding 11/11/2014      Prior to Admission medications   Medication Sig Start Date End Date Taking? Authorizing Provider  drospirenone-ethinyl estradiol (NIKKI) 3-0.02 MG tablet Take 1 tablet by mouth daily. 07/23/20   [name], MD  KRILL OIL PO Take 1 capsule by mouth daily.     [provider]  Multiple Vitamins-Minerals (WOMENS MULTIVITAMIN PO) Take by mouth.    [provider]    Allergies Levofloxacin, Reglan [metoclopramide], Other, Almond (diagnostic), Bactrim [sulfamethoxazole-trimethoprim], Equal [aspartame], Sulfa antibiotics, and Tape    Social History Social History   Tobacco Use  . Smoking status: Never Smoker  . Smokeless tobacco: Never Used  Vaping Use  . Vaping Use: Never used  Substance Use Topics  . Alcohol use: Not Currently    Alcohol/week: 0.0 standard drinks  . Drug use: No    Review of Systems Patient denies headaches, rhinorrhea, blurry vision, numbness, shortness of breath, chest pain, edema, cough, abdominal pain, nausea, vomiting, diarrhea, dysuria, fevers, rashes or hallucinations unless otherwise stated above in HPI. ____________________________________________   PHYSICAL EXAM:  VITAL SIGNS: Vitals:   01/21/21 2322 01/22/21 0305  BP: 138/84 (!) 142/80  Pulse: 78 76  Resp: 18 18  Temp:    SpO2: 100% 100%    Constitutional: Alert and oriented.  Eyes: Conjunctivae are normal.  Head: Atraumatic. Nose: No congestion/rhinnorhea. Mouth/Throat: Mucous membranes are moist.   Neck: No stridor.  Painless ROM.  Cardiovascular: Normal rate, regular rhythm. Good peripheral circulation. Respiratory: Normal respiratory effort.  No retractions.  Gastrointestinal: Soft and nontender. No distention. No abdominal bruits. No CVA tenderness. Genitourinary:  Musculoskeletal: No lower extremity tenderness nor edema.  No joint effusions. Neurologic:  CN- intact.  No facial droop, Normal FNF.    Sensation intact bilaterally. Normal speech and language. No gross focal neurologic deficits are appreciated. No gait instability. Skin:  Skin is warm, dry and intact. No rash noted. Psychiatric: Mood and affect are normal. Speech and behavior are normal.  ____________________________________________   LABS (all labs ordered are listed, but only abnormal results are displayed)  Results for orders placed or performed during the hospital encounter of 01/22/21 (from the past 24 hour(s))  CBC with Differential     Status: None   Collection Time: 01/21/21  8:48 PM  Result Value Ref Range   WBC 10.5 4.0 - 10.5 K/uL   RBC 4.40 3.87 - 5.11 MIL/uL   Hemoglobin 12.2 12.0 - 15.0 g/dL   HCT 58.5 27.7 - 82.4 %   MCV 84.1 80.0 - 100.0 fL   MCH 27.7 26.0 - 34.0  pg   MCHC 33.0 30.0 - 36.0 g/dL   RDW 65.712.8 84.611.5 - 96.215.5 %   Platelets 391 150 - 400 K/uL   nRBC 0.0 0.0 - 0.2 %   Neutrophils Relative % 56 %   Neutro Abs 6.0 1.7 - 7.7 K/uL   Lymphocytes Relative 34 %   Lymphs Abs 3.5 0.7 - 4.0 K/uL   Monocytes Relative 6 %   Monocytes Absolute 0.6 0.1 - 1.0 K/uL   Eosinophils Relative 3 %   Eosinophils Absolute 0.3 0.0 - 0.5 K/uL   Basophils Relative 1 %   Basophils Absolute 0.1 0.0 - 0.1 K/uL   Immature Granulocytes 0 %   Abs Immature Granulocytes 0.03 0.00 - 0.07 K/uL  Comprehensive metabolic panel     Status: Abnormal   Collection Time: 01/21/21  8:48 PM  Result Value Ref Range   Sodium 137 135 - 145 mmol/L   Potassium 3.7 3.5 - 5.1 mmol/L   Chloride 102 98 - 111 mmol/L   CO2 24 22 - 32 mmol/L   Glucose,  Bld 111 (H) 70 - 99 mg/dL   BUN 9 6 - 20 mg/dL   Creatinine, Ser 9.520.62 0.44 - 1.00 mg/dL   Calcium 9.7 8.9 - 84.110.3 mg/dL   Total Protein 8.4 (H) 6.5 - 8.1 g/dL   Albumin 3.9 3.5 - 5.0 g/dL   AST 23 15 - 41 U/L   ALT 23 0 - 44 U/L   Alkaline Phosphatase 99 38 - 126 U/L   Total Bilirubin 0.7 0.3 - 1.2 mg/dL   GFR, Estimated >32>60 >44>60 mL/min   Anion gap 11 5 - 15  Urinalysis, Routine w reflex microscopic Urine, Clean Catch     Status: Abnormal   Collection Time: 01/21/21  8:48 PM  Result Value Ref Range   Color, Urine YELLOW (A) YELLOW   APPearance HAZY (A) CLEAR   Specific Gravity, Urine 1.012 1.005 - 1.030   pH 5.0 5.0 - 8.0   Glucose, UA NEGATIVE NEGATIVE mg/dL   Hgb urine dipstick NEGATIVE NEGATIVE   Bilirubin Urine NEGATIVE NEGATIVE   Ketones, ur NEGATIVE NEGATIVE mg/dL   Protein, ur NEGATIVE NEGATIVE mg/dL   Nitrite NEGATIVE NEGATIVE   Leukocytes,Ua NEGATIVE NEGATIVE  Troponin I (High Sensitivity)     Status: None   Collection Time: 01/21/21  8:48 PM  Result Value Ref Range   Troponin I (High Sensitivity) 2 <18 ng/L  POC Urine Pregnancy, ED     Status: None   Collection Time: 01/21/21 10:33 PM  Result Value Ref Range   Preg Test, Ur Negative Negative  Troponin I (High Sensitivity)     Status: None   Collection Time: 01/21/21 11:22 PM  Result Value Ref Range   Troponin I (High Sensitivity) <2 <18 ng/L   ____________________________________________  EKG My review and personal interpretation at Time: 20:40   Indication: htn  Rate: 85  Rhythm: sinus Axis: normal Other: normal intervals, no stemi ____________________________________________  RADIOLOGY   ____________________________________________   PROCEDURES  Procedure(s) performed:  Procedures    Critical Care performed: no ____________________________________________   INITIAL IMPRESSION / ASSESSMENT AND PLAN / ED COURSE  Pertinent labs & imaging results that were available during my care of the patient  were reviewed by me and considered in my medical decision making (see chart for details).   DDX: Hypertension, electrolyte abnormality, dysrhythmia, CVA, migraine, tension, CST, stress  Grover Canavanrica D Rauls is a 35 y.o. who presents to the ED with presentation  as described above.  Patient nontoxic-appearing in no acute distress.  Blood pressure improved.  Blood work is reassuring.  Her exam is reassuring with no deficits.  She is asymptomatic at this time.  EKG without dysrhythmia or ischemic abnormality.  She is well-appearing.  I do believe she is appropriate for outpatient follow-up.     The patient was evaluated in Emergency Department today for the symptoms described in the history of present illness. He/she was evaluated in the context of the global COVID-19 pandemic, which necessitated consideration that the patient might be at risk for infection with the SARS-CoV-2 virus that causes COVID-19. Institutional protocols and algorithms that pertain to the evaluation of patients at risk for COVID-19 are in a state of rapid change based on information released by regulatory bodies including the CDC and federal and state organizations. These policies and algorithms were followed during the patient's care in the ED.  As part of my medical decision making, I reviewed the following data within the electronic MEDICAL RECORD NUMBER Nursing notes reviewed and incorporated, Labs reviewed, notes from prior ED visits and [place] Controlled Substance Database   ____________________________________________   FINAL CLINICAL IMPRESSION(S) / ED DIAGNOSES  Final diagnoses:  Hypertension, unspecified type      NEW MEDICATIONS STARTED DURING THIS VISIT:  New Prescriptions   No medications on file     Note:  This document was prepared using Dragon voice recognition software and may include unintentional dictation errors.    Willy Eddy, MD 01/22/21 0330

## 2021-01-22 NOTE — Discharge Instructions (Signed)
Please follow up with PCP.

## 2021-02-15 ENCOUNTER — Other Ambulatory Visit: Payer: Self-pay

## 2021-02-15 MED ORDER — SEMAGLUTIDE-WEIGHT MANAGEMENT 0.25 MG/0.5ML ~~LOC~~ SOAJ
0.5000 mL | SUBCUTANEOUS | 1 refills | Status: DC
  Filled 2021-02-15 – 2021-05-25 (×6): qty 2, 28d supply, fill #0

## 2021-02-23 ENCOUNTER — Other Ambulatory Visit: Payer: Self-pay

## 2021-02-25 ENCOUNTER — Other Ambulatory Visit: Payer: Self-pay

## 2021-02-25 ENCOUNTER — Encounter: Payer: Self-pay | Admitting: Obstetrics

## 2021-02-25 ENCOUNTER — Other Ambulatory Visit (HOSPITAL_COMMUNITY)
Admission: RE | Admit: 2021-02-25 | Discharge: 2021-02-25 | Disposition: A | Discharge status: Home / Self Care | Payer: 59 | Source: Ambulatory Visit | Attending: Obstetrics | Admitting: Obstetrics

## 2021-02-25 ENCOUNTER — Ambulatory Visit: Payer: 59 | Admitting: Obstetrics

## 2021-02-25 VITALS — BP 150/80 | Ht 68.0 in | Wt 237.0 lb

## 2021-02-25 DIAGNOSIS — B373 Candidiasis of vulva and vagina: Secondary | ICD-10-CM

## 2021-02-25 DIAGNOSIS — N898 Other specified noninflammatory disorders of vagina: Secondary | ICD-10-CM

## 2021-02-25 DIAGNOSIS — N9089 Other specified noninflammatory disorders of vulva and perineum: Secondary | ICD-10-CM

## 2021-02-25 DIAGNOSIS — B3731 Acute candidiasis of vulva and vagina: Secondary | ICD-10-CM

## 2021-02-25 MED ORDER — NYSTATIN-TRIAMCINOLONE 100000-0.1 UNIT/GM-% EX CREA
1.0000 | TOPICAL_CREAM | Freq: Two times a day (BID) | CUTANEOUS | 1 refills | Status: DC
Start: 2021-02-25 — End: 2021-08-03
  Filled 2021-02-25: qty 30, 15d supply, fill #0

## 2021-02-25 MED FILL — Drospirenone-Ethinyl Estradiol Tab 3-0.02 MG: ORAL | 84 days supply | Qty: 84 | Fill #0 | Status: AC

## 2021-02-25 MED FILL — Phentermine HCl Tab 37.5 MG: ORAL | 30 days supply | Qty: 30 | Fill #0 | Status: AC

## 2021-02-25 MED FILL — Fluoxetine HCl Tab 10 MG: ORAL | 30 days supply | Qty: 90 | Fill #0 | Status: AC

## 2021-02-25 NOTE — Progress Notes (Signed)
Obstetrics & Gynecology Office Visit   Chief Complaint:  Chief Complaint  Patient presents with  . Vaginal Itching    Irritation, no discharge or odor started in February    History of Present Illness: Melissa Martinez presents with a c/o several days of itching above her vagina close to her clitoral hood. She describes this a "spot " that itches. She denies any rash, or lesion in the area. She also denies any vaginal discharge or dysuria , or any UTI symptoms.  She has developed more skin sensitivity this past year. Recently she and her husband experimented with a new "toy" and she is concerned that there is some irritation from its use.   Review of Systems:  Review of Systems  Constitutional: Negative.   HENT: Negative.   Eyes: Negative.   Respiratory: Negative.   Cardiovascular: Negative.   Gastrointestinal: Negative.   Genitourinary: Negative.   Skin: Positive for itching.       Itching above her clitoral hood at the to of her labia.  Neurological: Negative.   Endo/Heme/Allergies: Negative.   Psychiatric/Behavioral: Negative.      Past Medical History:  Past Medical History:  Diagnosis Date  . Abdominal pain, generalized 11/11/2014  . Altered bowel function 11/11/2014  . Anxiety    tkes Prozac, for OCD related anxiety  . Bleeding per rectum 11/11/2014  . Calculus of kidney 12/14/2015  . Complication of anesthesia    sensitive / stops breathing  . Fatty infiltration of liver 11/11/2014  . Hypertension   . Occipital neuralgia   . PCOS (polycystic ovarian syndrome)   . PONV (postoperative nausea and vomiting)   . Upper airway cough syndrome 08/17/2015   Followed in Pulmonary clinic/ [facility]/ [place]      Past Surgical History:  Past Surgical History:  Procedure Laterality Date  . ACHILLES TENDON SURGERY Left 04/20/2018   Procedure: ACHILLES TENDON REPAIR-SECONDARY;  Surgeon: Gwyneth Revels, DPM;  Location: [facility] OR[facility];  Service: Podiatry;  Laterality: Left;  .  BALLOON SINUPLASTY Left 07/17/2019   Procedure: BALLOON SINUPLASTY;  Surgeon: Bud Face, MD;  Location: [facility];  Service: ENT;  Laterality: Left;  . CESAREAN SECTION N/A 06/22/2016   Procedure: CESAREAN SECTION;  Surgeon: [name], MD;  Location: [facility] OR[facility];  Service: Obstetrics;  Laterality: N/A;  . CHOLECYSTECTOMY    . COLONOSCOPY  09/2014  . DILATION AND CURETTAGE OF UTERUS  2013  . ENDOSCOPIC TURBINATE REDUCTION Bilateral 07/17/2019   Procedure: ENDOSCOPIC TURBINATE REDUCTION;  Surgeon: Bud Face, MD;  Location: [facility];  Service: ENT;  Laterality: Bilateral;  NEED STRYKER DISK GAVE DISK TO BRENDA 8-20  KP rep coming Gwenyth Allegra  . ESOPHAGOGASTRODUODENOSCOPY ENDOSCOPY    . EYE SURGERY     lasik  . hemmorhoidectomy  2012  . HYSTEROSCOPY  2015  . LAPAROSCOPIC CHOLECYSTECTOMY  06/2009  . LASIK Bilateral   . MAXILLARY ANTROSTOMY Right 07/17/2019   Procedure: MAXILLARY ANTROSTOMY;  Surgeon: Bud Face, MD;  Location: [facility];  Service: ENT;  Laterality: Right;  . OSTECTOMY Left 04/20/2018   Procedure: OSTECTOMY-HAGLUNDS/RETROCALCANEAL;  Surgeon: Gwyneth Revels, DPM;  Location: [facility];  Service: Podiatry;  Laterality: Left;  . STENT PLACE LEFT URETER ([facility] HX) Left 11/2012   has had 2 stones pass stones    Gynecologic History: Patient's last menstrual period was 02/11/2021 (approximate).  Obstetric History: [id number]  Family History:  Family History  Problem Relation Age of Onset  . Heart disease Father   .  Hyperlipidemia Father   . Breast cancer Maternal Grandmother   . Diabetes Maternal Grandmother   . Bladder Cancer Maternal Grandfather   . Heart disease Maternal Grandfather   . Kidney Stones Mother   . Hypertension Mother   . Hypertension Brother   . Kidney cancer Neg Hx     Social History:  Social History   Socioeconomic History  . Marital status: Married    Spouse name: Not on file  . Number of children:  Not on file  . Years of education: Not on file  . Highest education level: Not on file  Occupational History  . Occupation: Brewing technologist  Tobacco Use  . Smoking status: Never Smoker  . Smokeless tobacco: Never Used  Vaping Use  . Vaping Use: Never used  Substance and Sexual Activity  . Alcohol use: Not Currently    Alcohol/week: 0.0 standard drinks  . Drug use: No  . Sexual activity: Yes    Birth control/protection: Pill  Other Topics Concern  . Not on file  Social History Narrative  . Not on file   Social Determinants of Health   Financial Resource Strain: Not on file  Food Insecurity: Not on file  Transportation Needs: Not on file  Physical Activity: Not on file  Stress: Not on file  Social Connections: Not on file  Intimate Partner Violence: Not on file    Allergies:  Allergies  Allergen Reactions  . Levofloxacin Anaphylaxis, Itching and Other (See Comments)    Throat tightening, generalized itching, redness  . Reglan [Metoclopramide] Anxiety    Causes paranoia   . Other Other (See Comments)    splenda and equal- itching, hives  . Almond (Diagnostic) Hives  . Bactrim [Sulfamethoxazole-Trimethoprim] Itching    Also redness  . Equal [Aspartame] Itching  . Sulfa Antibiotics Other (See Comments)    Redness, hives, itching , skin felt on fire  . Tape Hives    Medications: Prior to Admission medications   Medication Sig Start Date End Date Taking? Authorizing Provider  albuterol (VENTOLIN HFA) 108 (90 Base) MCG/ACT inhaler Inhale into the lungs. 11/05/19  Yes [provider]  ALPRAZolam Prudy Feeler) 0.25 MG tablet alprazolam 0.25 mg tablet   Yes [provider]  ascorbic acid (VITAMIN C) 500 MG tablet Take by mouth.   Yes [provider]  azelastine (ASTELIN) 0.1 % nasal spray azelastine 137 mcg (0.1 %) nasal spray aerosol   Yes [provider]  budesonide (PULMICORT) 1 MG/2ML nebulizer solution USE 1 VIAL VIA NEBULIZER TWICE  A DAY AS DIRECTED 12/31/20 12/31/21 Yes Vaught, Roney Mans, MD  Cholecalciferol 50 MCG (2000 UT) CAPS Take by mouth.   Yes [provider]  drospirenone-ethinyl estradiol (YAZ) 3-0.02 MG tablet TAKE 1 TABLET BY MOUTH ONCE DAILY 07/23/20 07/23/21 Yes [name], MD  EPINEPHrine 0.3 mg/0.3 mL IJ SOAJ injection INJECT INTRAMUSCULARLY IN THE EVENT OF AN EMERGENT ALLERGIC REACTION WITH DIFFICULTY BREATHING. 08/22/18  Yes [provider]  FLUoxetine (PROZAC) 10 MG tablet TAKE 3 TABLETS (30MG ) BY MOUTH DAILY 06/24/20 06/24/21 Yes Whitaker, Jason Hestle, PA-C  hydrochlorothiazide (HYDRODIURIL) 25 MG tablet TAKE 1 TABLET BY MOUTH ONCE DAILY (2-3 DAYS AS DIRECTED) 09/08/20 09/08/21 Yes Whitaker, Jason Hestle, PA-C  KRILL OIL PO Take 1 capsule by mouth daily.    Yes [provider]  metFORMIN (GLUCOPHAGE-XR) 500 MG 24 hr tablet Take 1 tablet by mouth 2 (two) times daily. 08/11/20  Yes [provider]  Multiple Vitamins-Minerals (WOMENS MULTIVITAMIN  PO) Take by mouth.   Yes [provider]  nystatin-triamcinolone (MYCOLOG II) cream Apply 1 application topically 2 (two) times daily. 02/25/21  Yes Mirna Mires, CNM  ondansetron (ZOFRAN-ODT) 4 MG disintegrating tablet Take 4 mg by mouth every 8 (eight) hours as needed. 09/02/20  Yes [provider]  phentermine (ADIPEX-P) 37.5 MG tablet TAKE 1/2 TO 1 TABLET BY MOUTH DAILY EVERY MORNING BEFORE BREAKFAST 09/29/20 03/28/21 Yes Sherlon Handing, MD  Prenatal Multivit-Min-Fe-FA (PRENATAL, W/IRON & FA,) 27-0.8 MG TABS Take 1 tablet by mouth daily.   Yes [provider]  Semaglutide-Weight Management 0.25 MG/0.5ML SOAJ Inject 0.25 mg into the skin once a week. 02/15/21  Yes   SODIUM CHLORIDE, EXTERNAL, (SALINE WOUND WASH) 0.9 % SOLN Apply topically 2 (two) times daily.   Yes [provider]  zinc gluconate 50 MG tablet Take by mouth.   Yes [provider]    Physical Exam Vitals:  Vitals:    02/25/21 1523  BP: (!) 150/80   Patient's last menstrual period was 02/11/2021 (approximate).  Physical Exam Constitutional:      Appearance: Normal appearance.  HENT:     Head: Normocephalic and atraumatic.     Nose: Nose normal.  Cardiovascular:     Rate and Rhythm: Normal rate and regular rhythm.     Pulses: Normal pulses.     Heart sounds: Normal heart sounds.  Pulmonary:     Effort: Pulmonary effort is normal.     Breath sounds: Normal breath sounds.  Abdominal:     Palpations: Abdomen is soft.  Genitourinary:    Comments: No vaginal discharge. Reddened, slightly shiny area of tissue area at the top of her labia and in the creases. No lesions or rashes. Musculoskeletal:        General: Normal range of motion.     Cervical back: Normal range of motion and neck supple.  Skin:    General: Skin is warm and dry.  Neurological:     General: No focal deficit present.     Mental Status: She is alert and oriented to person, place, and time.  Psychiatric:        Mood and Affect: Mood normal.        Behavior: Behavior normal.      Assessment: 35 y.o. [id number] with labial irritation Likely topical vulvovaginitis.   Plan: Problem List Items Addressed This Visit   None   Visit Diagnoses    Vaginal discharge    -  Primary   Relevant Orders   Cervicovaginal ancillary only   Labial irritation       Relevant Medications   nystatin-triamcinolone (MYCOLOG II) cream   Vulvovaginitis due to yeast       Relevant Medications   nystatin-triamcinolone (MYCOLOG II) cream   Other Relevant Orders   Cervicovaginal ancillary only     Rx for Mycolog sent to pharmacy. She will use this and follow up if there is no improvement. Nuswab sent. Mirna Mires, CNM  02/25/2021 5:58 PM

## 2021-03-01 LAB — CERVICOVAGINAL ANCILLARY ONLY
Bacterial Vaginitis (gardnerella): NEGATIVE
Candida Glabrata: NEGATIVE
Candida Vaginitis: NEGATIVE
Comment: NEGATIVE
Comment: NEGATIVE
Comment: NEGATIVE

## 2021-03-02 ENCOUNTER — Encounter: Payer: Self-pay | Admitting: Obstetrics

## 2021-03-03 ENCOUNTER — Other Ambulatory Visit: Payer: Self-pay

## 2021-03-03 DIAGNOSIS — F411 Generalized anxiety disorder: Secondary | ICD-10-CM | POA: Diagnosis not present

## 2021-03-03 DIAGNOSIS — I10 Essential (primary) hypertension: Secondary | ICD-10-CM | POA: Diagnosis not present

## 2021-03-03 MED ORDER — BUSPIRONE HCL 5 MG PO TABS
5.0000 mg | ORAL_TABLET | Freq: Two times a day (BID) | ORAL | 11 refills | Status: DC
  Filled 2021-03-03: qty 60, 30d supply, fill #0
  Filled 2021-05-19: qty 60, 30d supply, fill #1
  Filled 2021-07-08: qty 60, 30d supply, fill #2
  Filled 2021-08-02: qty 60, 30d supply, fill #3

## 2021-03-03 MED ORDER — LOSARTAN POTASSIUM 50 MG PO TABS
1.0000 | ORAL_TABLET | Freq: Every day | ORAL | 11 refills | Status: DC
  Filled 2021-03-03: qty 30, 30d supply, fill #0
  Filled 2021-03-31: qty 30, 30d supply, fill #1
  Filled 2021-05-19: qty 30, 30d supply, fill #2
  Filled 2021-06-22: qty 30, 30d supply, fill #3
  Filled 2021-08-02: qty 30, 30d supply, fill #4
  Filled 2021-08-16 – 2021-09-01 (×2): qty 30, 30d supply, fill #0
  Filled 2021-09-27: qty 30, 30d supply, fill #1
  Filled 2021-11-01: qty 30, 30d supply, fill #2
  Filled 2021-12-01: qty 30, 30d supply, fill #3
  Filled 2022-01-17: qty 30, 30d supply, fill #4
  Filled 2022-02-21: qty 30, 30d supply, fill #5

## 2021-03-04 ENCOUNTER — Other Ambulatory Visit: Payer: Self-pay

## 2021-03-17 ENCOUNTER — Other Ambulatory Visit: Payer: Self-pay

## 2021-03-31 ENCOUNTER — Other Ambulatory Visit: Payer: Self-pay

## 2021-03-31 MED ORDER — METFORMIN HCL ER 500 MG PO TB24
ORAL_TABLET | ORAL | 0 refills | Status: DC
  Filled 2021-03-31: qty 60, 30d supply, fill #0

## 2021-03-31 MED FILL — Fluoxetine HCl Tab 10 MG: ORAL | 1 days supply | Qty: 3 | Fill #1 | Status: CN

## 2021-03-31 MED FILL — Fluoxetine HCl Tab 10 MG: ORAL | 89 days supply | Qty: 267 | Fill #1 | Status: AC

## 2021-03-31 MED FILL — Fluoxetine HCl Tab 10 MG: ORAL | 1 days supply | Qty: 3 | Fill #1 | Status: AC

## 2021-04-01 ENCOUNTER — Other Ambulatory Visit: Payer: Self-pay

## 2021-04-06 ENCOUNTER — Other Ambulatory Visit: Payer: Self-pay

## 2021-04-07 ENCOUNTER — Other Ambulatory Visit: Payer: Self-pay

## 2021-05-19 ENCOUNTER — Other Ambulatory Visit: Payer: Self-pay

## 2021-05-25 ENCOUNTER — Other Ambulatory Visit: Payer: Self-pay

## 2021-05-27 ENCOUNTER — Telehealth: Payer: 59 | Admitting: Physician Assistant

## 2021-05-27 ENCOUNTER — Encounter: Payer: Self-pay | Admitting: Physician Assistant

## 2021-05-27 ENCOUNTER — Other Ambulatory Visit: Payer: Self-pay

## 2021-05-27 DIAGNOSIS — Z20822 Contact with and (suspected) exposure to covid-19: Secondary | ICD-10-CM

## 2021-05-27 MED ORDER — ALBUTEROL SULFATE HFA 108 (90 BASE) MCG/ACT IN AERS
2.0000 | INHALATION_SPRAY | Freq: Four times a day (QID) | RESPIRATORY_TRACT | 0 refills | Status: DC | PRN
  Filled 2021-05-27: qty 8.5, 30d supply, fill #0

## 2021-05-27 MED ORDER — FLUTICASONE PROPIONATE HFA 110 MCG/ACT IN AERO
2.0000 | INHALATION_SPRAY | Freq: Two times a day (BID) | RESPIRATORY_TRACT | 12 refills | Status: DC
  Filled 2021-05-27: qty 12, 30d supply, fill #0

## 2021-05-27 NOTE — Progress Notes (Signed)
Ms. Melissa, Martinez are scheduled for a virtual visit with your provider today.    Just as we do with appointments in the office, we must obtain your consent to participate.  Your consent will be active for this visit and any virtual visit you may have with one of our providers in the next 365 days.    If you have a MyChart account, I can also send a copy of this consent to you electronically.  All virtual visits are billed to your insurance company just like a traditional visit in the office.  As this is a virtual visit, video technology does not allow for your provider to perform a traditional examination.  This may limit your provider's ability to fully assess your condition.  If your provider identifies any concerns that need to be evaluated in person or the need to arrange testing such as labs, EKG, etc, we will make arrangements to do so.    Although advances in technology are sophisticated, we cannot ensure that it will always work on either your end or our end.  If the connection with a video visit is poor, we may have to switch to a telephone visit.  With either a video or telephone visit, we are not always able to ensure that we have a secure connection.   I need to obtain your verbal consent now.   Are you willing to proceed with your visit today?   Melissa Martinez has provided verbal consent on 05/27/2021 for a virtual visit (video or telephone).   Margaretann Loveless, PA-C 05/27/2021  3:16 PM  Virtual Visit Consent   Melissa Canavan, you are scheduled for a virtual visit with a [place] provider today.     Just as with appointments in the office, your consent must be obtained to participate.  Your consent will be active for this visit and any virtual visit you may have with one of our providers in the next 365 days.     If you have a MyChart account, a copy of this consent can be sent to you electronically.  All virtual visits are billed to your insurance company just like a traditional visit in  the office.    As this is a virtual visit, video technology does not allow for your provider to perform a traditional examination.  This may limit your provider's ability to fully assess your condition.  If your provider identifies any concerns that need to be evaluated in person or the need to arrange testing (such as labs, EKG, etc.), we will make arrangements to do so.     Although advances in technology are sophisticated, we cannot ensure that it will always work on either your end or our end.  If the connection with a video visit is poor, the visit may have to be switched to a telephone visit.  With either a video or telephone visit, we are not always able to ensure that we have a secure connection.     I need to obtain your verbal consent now.   Are you willing to proceed with your visit today?    Melissa Martinez has provided verbal consent on 05/27/2021 for a virtual visit (video or telephone).   Margaretann Loveless, PA-C   Date: 05/27/2021 3:16 PM   Virtual Visit via Video Note   I, Margaretann Loveless, connected with  Melissa Martinez  ([id number], 06/17/1986) on 05/27/21 at  3:15 PM EDT by a video-enabled telemedicine application  and verified that I am speaking with the correct person using two identifiers.  Location: Patient: Virtual Visit Location Patient: Home Provider: Virtual Visit Location Provider: Home Office   I discussed the limitations of evaluation and management by telemedicine and the availability of in person appointments. The patient expressed understanding and agreed to proceed.    History of Present Illness: Melissa Martinez is a 35 y.o. who identifies as a female who was assigned female at birth, and is being seen today for possible covid 5319.  HPI: URI  This is a new problem. The current episode started today. Maximum temperature: 99.1. Associated symptoms include chest pain, congestion, coughing (dry), ear pain (right side more so), headaches, nausea, a sore throat and  swollen glands (right side). Pertinent negatives include no plugged ear sensation, rhinorrhea, sinus pain or sneezing. Associated symptoms comments: Labored breathing, pleuritic chest pain, fatigue. She has tried acetaminophen and NSAIDs for the symptoms. The treatment provided no relief.   O2 sat is 96% HR 108 Home rapid test this morning was negative, PCR is pending  Chest pain is substernal and pleuritic in nature. No significant SOB noted or difficulty breathing. Most painful with cough.   Exposed to covid 19 on 05/20/21  Problems:  Patient Active Problem List   Diagnosis Date Noted   Allergy to almonds 08/23/2018   Vaccine reaction, initial encounter 08/23/2018   Allergic rhinitis 08/23/2018   Chronic neck pain 04/16/2018   Headache disorder 04/16/2018   Asymptomatic hyperuricemia 02/05/2018   Chronic pain of left heel 02/05/2018   Screening for cervical cancer 08/01/2017   Nausea 07/03/2017   Dehydration 06/12/2017   Adenitis 05/23/2017   Adenitis 05/23/2017   Closed Colles' fracture of left radius 10/17/2016   Kidney stones 12/14/2015   PCOS (polycystic ovarian syndrome) 12/14/2015   Upper airway cough syndrome 08/17/2015   Upper airway cough syndrome 08/17/2015   Abdominal pain, chronic, generalized 11/11/2014   Bleeding per rectum 11/11/2014   Change in bowel habits 11/11/2014   Fatty infiltration of liver 11/11/2014   Bright red rectal bleeding 11/11/2014    Allergies:  Allergies  Allergen Reactions   Levofloxacin Anaphylaxis, Itching and Other (See Comments)    Throat tightening, generalized itching, redness   Reglan [Metoclopramide] Anxiety    Causes paranoia    Other Other (See Comments)    splenda and equal- itching, hives   Almond (Diagnostic) Hives   Bactrim [Sulfamethoxazole-Trimethoprim] Itching    Also redness   Equal [Aspartame] Itching   Sulfa Antibiotics Other (See Comments)    Redness, hives, itching , skin felt on fire   Tape Hives    Medications:  Current Outpatient Medications:    albuterol (VENTOLIN HFA) 108 (90 Base) MCG/ACT inhaler, Inhale 2 puffs into the lungs every 6 (six) hours as needed for wheezing or shortness of breath., Disp: 8.5 g, Rfl: 0   fluticasone (FLOVENT HFA) 110 MCG/ACT inhaler, Inhale 2 puffs into the lungs in the morning and at bedtime., Disp: 12 g, Rfl: 12   albuterol (VENTOLIN HFA) 108 (90 Base) MCG/ACT inhaler, Inhale into the lungs., Disp: , Rfl:    ALPRAZolam (XANAX) 0.25 MG tablet, alprazolam 0.25 mg tablet, Disp: , Rfl:    ascorbic acid (VITAMIN C) 500 MG tablet, Take by mouth., Disp: , Rfl:    azelastine (ASTELIN) 0.1 % nasal spray, azelastine 137 mcg (0.1 %) nasal spray aerosol, Disp: , Rfl:    budesonide (PULMICORT) 1 MG/2ML nebulizer solution, USE 1 VIAL VIA NEBULIZER  TWICE A DAY AS DIRECTED, Disp: 120 mL, Rfl: 6   busPIRone (BUSPAR) 5 MG tablet, Take 1 tablet (5 mg total) by mouth 2 (two) times daily, Disp: 60 tablet, Rfl: 11   Cholecalciferol 50 MCG (2000 UT) CAPS, Take by mouth., Disp: , Rfl:    drospirenone-ethinyl estradiol (YAZ) 3-0.02 MG tablet, TAKE 1 TABLET BY MOUTH ONCE DAILY, Disp: 252 tablet, Rfl: 0   EPINEPHrine 0.3 mg/0.3 mL IJ SOAJ injection, INJECT INTRAMUSCULARLY IN THE EVENT OF AN EMERGENT ALLERGIC REACTION WITH DIFFICULTY BREATHING., Disp: , Rfl:    FLUoxetine (PROZAC) 10 MG tablet, TAKE 3 TABLETS (30MG ) BY MOUTH DAILY, Disp: 270 tablet, Rfl: 2   hydrochlorothiazide (HYDRODIURIL) 25 MG tablet, TAKE 1 TABLET BY MOUTH ONCE DAILY (2-3 DAYS AS DIRECTED), Disp: 30 tablet, Rfl: 11   KRILL OIL PO, Take 1 capsule by mouth daily. , Disp: , Rfl:    losartan (COZAAR) 50 MG tablet, Take 1 tablet (50 mg total) by mouth once daily, Disp: 30 tablet, Rfl: 11   metFORMIN (GLUCOPHAGE-XR) 500 MG 24 hr tablet, Take 1 tablet by mouth 2 (two) times daily., Disp: , Rfl:    metFORMIN (GLUCOPHAGE-XR) 500 MG 24 hr tablet, Take 1 tablet by mouth twice a day, Disp: 240 tablet, Rfl: 0   Multiple  Vitamins-Minerals (WOMENS MULTIVITAMIN PO), Take by mouth., Disp: , Rfl:    nystatin-triamcinolone (MYCOLOG II) cream, Apply 1 application topically 2 (two) times daily., Disp: 30 g, Rfl: 1   ondansetron (ZOFRAN-ODT) 4 MG disintegrating tablet, Take 4 mg by mouth every 8 (eight) hours as needed., Disp: , Rfl:    phentermine (ADIPEX-P) 37.5 MG tablet, TAKE 1/2 TO 1 TABLET BY MOUTH DAILY EVERY MORNING BEFORE BREAKFAST, Disp: 30 tablet, Rfl: 5   Prenatal Multivit-Min-Fe-FA (PRENATAL, W/IRON & FA,) 27-0.8 MG TABS, Take 1 tablet by mouth daily., Disp: , Rfl:    Semaglutide-Weight Management 0.25 MG/0.5ML SOAJ, Inject 0.25 mg into the skin once a week., Disp: 2 mL, Rfl: 1   SODIUM CHLORIDE, EXTERNAL, (SALINE WOUND WASH) 0.9 % SOLN, Apply topically 2 (two) times daily., Disp: , Rfl:    zinc gluconate 50 MG tablet, Take by mouth., Disp: , Rfl:   Observations/Objective: Patient is well-developed, well-nourished in no acute distress.  Resting comfortably at home.  Head is normocephalic, atraumatic.  No labored breathing. 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  Patient is alert and oriented at baseline.   Assessment and Plan: 1. Suspected COVID-19 virus infection - albuterol (VENTOLIN HFA) 108 (90 Base) MCG/ACT inhaler; Inhale 2 puffs into the lungs every 6 (six) hours as needed for wheezing or shortness of breath.  Dispense: 8.5 g; Refill: 0 - fluticasone (FLOVENT HFA) 110 MCG/ACT inhaler; Inhale 2 puffs into the lungs in the morning and at bedtime.  Dispense: 12 g; Refill: 12 - Advised to notify if covid testing is positive for antiviral treatment options - Continue OTC symptomatic management of choice - Will send OTC  vitamins and supplement information through [facility] - Patient enrolled in MyChart symptom monitoring - Push fluids - Rest as needed - Discussed return precautions and when to seek in-person evaluation, sent via [facility] as well  Follow Up Instructions: I discussed the assessment and treatment plan with the patient. The patient was  provided an opportunity to ask questions and all were answered. The patient agreed with the plan and demonstrated an understanding of the instructions.  A copy of instructions were sent to the patient via MyChart.  The patient was advised to call back or seek an in-person evaluation if the symptoms worsen or if the condition fails to improve as anticipated.  Time:  I spent 14 minutes with the patient via telehealth technology discussing the above problems/concerns.    Margaretann Loveless, PA-C

## 2021-05-27 NOTE — Patient Instructions (Signed)
Hello Melissa Martinez,  You are being placed in the home monitoring program for COVID-19 (commonly known as Coronavirus).  This is because you are suspected to have the virus or are known to have the virus.  If you are unsure which group you fall into call your clinic.    As part of this program, you'll answer a daily questionnaire in the MyChart mobile app. You'll receive a notification through the MyChart app when the questionnaire is available. When you log in to MyChart, you'll see the tasks in your To Do activity.       Clinicians will see any answers that are concerning and take appropriate steps.  If at any point you are having a medical emergency, call 911.  If otherwise concerned call your clinic instead of coming into the clinic or hospital.  To keep from spreading the disease you should: Stay home and limit contact with other people as much as possible.  Wash your hands frequently. Cover your coughs and sneezes with a tissue, and throw used tissues in the trash.   Clean and disinfect frequently touched surfaces and objects.    Take care of yourself by: Staying home Resting Drinking fluids Take fever-reducing medications (Tylenol/Acetaminophen and Ibuprofen)  For more information on the disease go to the Centers for Disease Control and Prevention website     Can take to lessen severity: Vit C 500mg  twice daily Quercertin 250-500mg  twice daily Zinc 75-100mg  daily Melatonin 3-6 mg at bedtime Vit D3 1000-2000 IU daily Aspirin 81 mg daily with food Optional: Famotidine 20mg  daily Also can add tylenol/ibuprofen as needed for fevers and body aches May add Mucinex or Mucinex DM as needed for cough/congestion  10 Things You Can Do to Manage Your COVID-19 Symptoms at Home If you have possible or confirmed COVID-19 Stay home except to get medical care. Monitor your symptoms carefully. If your symptoms get worse, call your healthcare provider immediately. Get rest and stay hydrated. If  you have a medical appointment, call the healthcare provider ahead of time and tell them that you have or may have COVID-19. For medical emergencies, call 911 and notify the dispatch personnel that you have or may have COVID-19. Cover your cough and sneezes with a tissue or use the inside of your elbow. Wash your hands often with soap and water for at least 20 seconds or clean your hands with an alcohol-based hand sanitizer that contains at least 60% alcohol. As much as possible, stay in a specific room and away from other people in your home. Also, you should use a separate bathroom, if available. If you need to be around other people in or outside of the home, wear a mask. Avoid sharing personal items with other people in your household, like dishes, towels, and bedding. Clean all surfaces that are touched often, like counters, tabletops, and doorknobs. Use household cleaning sprays or wipes according to the label instructions. 05/29/2020 This information is not intended to replace advice given to you by your health care provider. Make sure you discuss any questions you have with your healthcare provider. Document Revised: 12/18/2020 Document Reviewed: 12/18/2020 Elsevier Patient Education  2022 02/15/2021.

## 2021-05-31 ENCOUNTER — Other Ambulatory Visit: Payer: Self-pay

## 2021-05-31 MED ORDER — SAXENDA 18 MG/3ML ~~LOC~~ SOPN
PEN_INJECTOR | SUBCUTANEOUS | 0 refills | Status: DC
  Filled 2021-05-31: qty 15, 30d supply, fill #0

## 2021-05-31 MED ORDER — UNIFINE PENTIPS 31G X 5 MM MISC
0 refills | Status: DC
  Filled 2021-05-31: qty 100, 90d supply, fill #0

## 2021-06-01 ENCOUNTER — Other Ambulatory Visit: Payer: Self-pay

## 2021-06-01 ENCOUNTER — Encounter: Payer: Self-pay | Admitting: Obstetrics

## 2021-06-01 ENCOUNTER — Ambulatory Visit: Payer: 59 | Admitting: Obstetrics

## 2021-06-01 VITALS — BP 120/70 | Ht 68.0 in | Wt 250.2 lb

## 2021-06-01 DIAGNOSIS — R102 Pelvic and perineal pain: Secondary | ICD-10-CM | POA: Diagnosis not present

## 2021-06-01 MED ORDER — OXYCODONE HCL 5 MG PO TABS
5.0000 mg | ORAL_TABLET | ORAL | 0 refills | Status: DC | PRN
  Filled 2021-06-01: qty 15, 3d supply, fill #0

## 2021-06-01 NOTE — Progress Notes (Signed)
Obstetrics & Gynecology Office Visit   Chief Complaint:  Chief Complaint  Patient presents with   Gynecologic Exam    History of Present Illness: Melissa Martinez presents for right sided pelvic pain. Two days ago her daughter fell on this area and the pain was intense. Melissa Martinez reports that she has had right adnexal pain in the past that has been evaluated by scope, ultrasound and exploratory means without any clear explanation of the etiology. Unfortunately, her daughter landed right on this same area. She describes the initial pain as 8 ourt of 10. Today, the pain is a 5 or 6. She has tried heat, Toradol and Tylenol/ Ibuprofen , and still feels lkie she can't get relief  She denies any vaginal bleeding. She is able to move and walk. She has missed two days of work.   Review of Systems:  Review of Systems  Constitutional: Negative.   HENT: Negative.    Eyes: Negative.   Respiratory: Negative.    Cardiovascular: Negative.   Gastrointestinal: Negative.   Genitourinary: Negative.   Musculoskeletal: Negative.   Skin: Negative.   Neurological: Negative.   Endo/Heme/Allergies: Negative.   Psychiatric/Behavioral: Negative.      Past Medical History:  Past Medical History:  Diagnosis Date   Abdominal pain, generalized 11/11/2014   Altered bowel function 11/11/2014   Anxiety    tkes Prozac, for OCD related anxiety   Bleeding per rectum 11/11/2014   Calculus of kidney 12/14/2015   Complication of anesthesia    sensitive / stops breathing   Fatty infiltration of liver 11/11/2014   Hypertension    Occipital neuralgia    PCOS (polycystic ovarian syndrome)    PONV (postoperative nausea and vomiting)    Upper airway cough syndrome 08/17/2015   Followed in Pulmonary clinic/ [facility]/ [place]      Past Surgical History:  Past Surgical History:  Procedure Laterality Date   ACHILLES TENDON SURGERY Left 04/20/2018   Procedure: ACHILLES TENDON REPAIR-SECONDARY;  Surgeon: Gwyneth RevelsFowler, Justin, DPM;   Location: [facility];  Service: Podiatry;  Laterality: Left;   BALLOON SINUPLASTY Left 07/17/2019   Procedure: BALLOON SINUPLASTY;  Surgeon: Bud FaceVaught, Creighton, MD;  [facility];  Service: ENT;  Laterality: Left;   CESAREAN SECTION N/A 06/22/2016   Procedure: CESAREAN SECTION;  Surgeon: Nadara Mustardobert P Harris, MD;  Location: [facility];  Service: Obstetrics;  Laterality: N/A;   CHOLECYSTECTOMY     COLONOSCOPY  09/2014   DILATION AND CURETTAGE OF UTERUS  2013   ENDOSCOPIC TURBINATE REDUCTION Bilateral 07/17/2019   Procedure: ENDOSCOPIC TURBINATE REDUCTION;  Surgeon: Bud FaceVaught, Creighton, MD;  [facility];  Service: ENT;  Laterality: Bilateral;  NEED STRYKER DISK GAVE DISK TO BRENDA 8-20  KP rep coming Jessica Nihart   ESOPHAGOGASTRODUODENOSCOPY ENDOSCOPY     EYE SURGERY     lasik   hemmorhoidectomy  2012   HYSTEROSCOPY  2015   LAPAROSCOPIC CHOLECYSTECTOMY  06/2009   LASIK Bilateral    MAXILLARY ANTROSTOMY Right 07/17/2019   Procedure: MAXILLARY ANTROSTOMY;  Surgeon: Bud FaceVaught, Creighton, MD;  [facility];  Service: ENT;  Laterality: Right;   OSTECTOMY Left 04/20/2018   Procedure: OSTECTOMY-HAGLUNDS/RETROCALCANEAL;  Surgeon: Gwyneth RevelsFowler, Justin, DPM;  Location: [facility];  Service: Podiatry;  Laterality: Left;   STENT PLACE LEFT URETER ([facility] HX) Left 11/2012   has had 2 stones pass stones    Gynecologic History: Patient's last menstrual period was 04/26/2021.  Obstetric History: [id number]  Family History:  Family History  Problem Relation Age of Onset   Heart disease Father    Hyperlipidemia Father    Breast cancer Maternal Grandmother    Diabetes Maternal Grandmother    Bladder Cancer Maternal Grandfather    Heart disease Maternal Grandfather    Kidney Stones Mother    Hypertension Mother    Hypertension Brother    Kidney cancer Neg Hx     Social History:  Social History   Socioeconomic History   Marital status: Married    Spouse name: Not on file    Number of children: Not on file   Years of education: Not on file   Highest education level: Not on file  Occupational History   Occupation: Brewing technologist  Tobacco Use   Smoking status: Never   Smokeless tobacco: Never  Vaping Use   Vaping Use: Never used  Substance and Sexual Activity   Alcohol use: Not Currently    Alcohol/week: 0.0 standard drinks   Drug use: No   Sexual activity: Yes    Birth control/protection: Pill  Other Topics Concern   Not on file  Social History Narrative   Not on file   Social Determinants of Health   Financial Resource Strain: Not on file  Food Insecurity: Not on file  Transportation Needs: Not on file  Physical Activity: Not on file  Stress: Not on file  Social Connections: Not on file  Intimate Partner Violence: Not on file    Allergies:  Allergies  Allergen Reactions   Levofloxacin Anaphylaxis, Itching and Other (See Comments)    Throat tightening, generalized itching, redness   Reglan [Metoclopramide] Anxiety    Causes paranoia    Other Other (See Comments)    splenda and equal- itching, hives   Almond (Diagnostic) Hives   Bactrim [Sulfamethoxazole-Trimethoprim] Itching    Also redness   Equal [Aspartame] Itching   Sulfa Antibiotics Other (See Comments)    Redness, hives, itching , skin felt on fire   Tape Hives    Medications: Prior to Admission medications   Medication Sig Start Date End Date Taking? Authorizing Provider  albuterol (VENTOLIN HFA) 108 (90 Base) MCG/ACT inhaler Inhale into the lungs. 11/05/19  Yes [provider]  albuterol (VENTOLIN HFA) 108 (90 Base) MCG/ACT inhaler Inhale 2 puffs into the lungs every 6 (six) hours as needed for wheezing or shortness of breath. 05/27/21  Yes Burnette, Victorino Dike M, PA-C  ALPRAZolam Prudy Feeler) 0.25 MG tablet alprazolam 0.25 mg tablet   Yes [provider]  ascorbic acid (VITAMIN C) 500 MG tablet Take by mouth.   Yes [provider]  budesonide  (PULMICORT) 1 MG/2ML nebulizer solution USE 1 VIAL VIA NEBULIZER TWICE A DAY AS DIRECTED 12/31/20 12/31/21 Yes Vaught, Roney Mans, MD  busPIRone (BUSPAR) 5 MG tablet Take 1 tablet (5 mg total) by mouth 2 (two) times daily 03/03/21  Yes   Cholecalciferol 50 MCG (2000 UT) CAPS Take by mouth.   Yes [provider]  drospirenone-ethinyl estradiol (YAZ) 3-0.02 MG tablet TAKE 1 TABLET BY MOUTH ONCE DAILY 07/23/20 07/23/21 Yes [name], MD  EPINEPHrine 0.3 mg/0.3 mL IJ SOAJ injection INJECT INTRAMUSCULARLY IN THE EVENT OF AN EMERGENT ALLERGIC REACTION WITH DIFFICULTY BREATHING. 08/22/18  Yes [provider]  FLUoxetine (PROZAC) 10 MG tablet TAKE 3 TABLETS (30MG ) BY MOUTH DAILY 06/24/20 07/04/21 Yes Whitaker, Jason Hestle, PA-C  fluticasone (FLOVENT HFA) 110 MCG/ACT inhaler Inhale 2 puffs into the lungs in the morning and at bedtime. 05/27/21  Yes Burnette,  Alessandra Bevels, PA-C  Insulin Pen Needle (UNIFINE PENTIPS) 31G X 5 MM MISC Use as directed with Saxenda 05/31/21  Yes Whitaker, Jason Hestle, PA-C  KRILL OIL PO Take 1 capsule by mouth daily.    Yes [provider]  Liraglutide -Weight Management (SAXENDA) 18 MG/3ML SOPN Inject 0.6 mg subcutaneously once daily for 7 days, THEN 1.2 mg once daily for 7 days, THEN 1.8 mg once daily for 7 days, THEN 2.4 mg once daily for 7 days, THEN 3 mg once daily for 7 days. 05/31/21  Yes   losartan (COZAAR) 50 MG tablet Take 1 tablet (50 mg total) by mouth once daily 03/03/21  Yes   metFORMIN (GLUCOPHAGE-XR) 500 MG 24 hr tablet Take 1 tablet by mouth 2 (two) times daily. 08/11/20  Yes [provider]  metFORMIN (GLUCOPHAGE-XR) 500 MG 24 hr tablet Take 1 tablet by mouth twice a day 08/11/20  Yes   Multiple Vitamins-Minerals (WOMENS MULTIVITAMIN PO) Take by mouth.   Yes [provider]  nystatin-triamcinolone (MYCOLOG II) cream Apply 1 application topically 2 (two) times daily. 02/25/21  Yes Mirna Mires, CNM  ondansetron (ZOFRAN-ODT) 4 MG  disintegrating tablet Take 4 mg by mouth every 8 (eight) hours as needed. 09/02/20  Yes [provider]  oxyCODONE (ROXICODONE) 5 MG immediate release tablet Take 1 tablet (5 mg total) by mouth every 4 (four) hours as needed for severe pain. 06/01/21  Yes Mirna Mires, CNM  Prenatal Multivit-Min-Fe-FA (PRENATAL, W/IRON & FA,) 27-0.8 MG TABS Take 1 tablet by mouth daily.   Yes [provider]  SODIUM CHLORIDE, EXTERNAL, (SALINE WOUND WASH) 0.9 % SOLN Apply topically 2 (two) times daily.   Yes [provider]  zinc gluconate 50 MG tablet Take by mouth.   Yes [provider]  azelastine (ASTELIN) 0.1 % nasal spray azelastine 137 mcg (0.1 %) nasal spray aerosol Patient not taking: Reported on 06/01/2021    [provider]  hydrochlorothiazide (HYDRODIURIL) 25 MG tablet TAKE 1 TABLET BY MOUTH ONCE DAILY (2-3 DAYS AS DIRECTED) Patient not taking: Reported on 06/01/2021 09/08/20 09/08/21  Debbra Riding Hestle, PA-C  phentermine (ADIPEX-P) 37.5 MG tablet TAKE 1/2 TO 1 TABLET BY MOUTH DAILY EVERY MORNING BEFORE BREAKFAST 09/29/20 03/28/21  Sherlon Handing, MD  Semaglutide-Weight Management 0.25 MG/0.5ML SOAJ Inject 0.25 mg into the skin once a week. Patient not taking: Reported on 06/01/2021 02/15/21       Physical Exam Vitals:  Vitals:   06/01/21 1607  BP: 120/70   Patient's last menstrual period was 04/26/2021.  Physical Exam Constitutional:      Appearance: Normal appearance. She is obese.  HENT:     Head: Normocephalic and atraumatic.  Cardiovascular:     Rate and Rhythm: Normal rate and regular rhythm.  Pulmonary:     Effort: Pulmonary effort is normal.     Breath sounds: Normal breath sounds.  Abdominal:     Palpations: Abdomen is soft.     Tenderness: There is abdominal tenderness. There is guarding.     Comments: Pain on deep palpation in the right adnexal area. No pain with light palpation. Previous CS section scar noted. No  bruising noted or swelling.  Genitourinary:    Comments: Normal exteranl genitalia, no rashes or lesions. Internally no vaginal pain or bleeding noted. Bimanual exam reveals tenderness in her right adnexal area. No masses or enlargement felt, just pain with exam. Uterus is non enlarged, anteverted. Musculoskeletal:  General: Normal range of motion.     Cervical back: Normal range of motion and neck supple.  Skin:    General: Skin is warm and dry.  Neurological:     General: No focal deficit present.     Mental Status: She is alert and oriented to person, place, and time.  Psychiatric:        Mood and Affect: Mood normal.        Behavior: Behavior normal.     Assessment: 35 y.o. [id number] Right adnexal/abdominal pain from injury   Plan: Problem List Items Addressed This Visit   None Visit Diagnoses     Pelvic pain    -  Primary   Relevant Orders   US PELVIS (TRANSABDOMINAL ONLY)     Offered limited Roxicodone- 15 count provided . Encouraged her to use heat or cold packs to address. Pelvic ultrasound ordered which she requests be done at [facility]. Will review the sono report, and she will follow up PRN if not improved.  Mirna Mires, CNM  06/01/2021 5:58 PM

## 2021-06-03 DIAGNOSIS — E282 Polycystic ovarian syndrome: Secondary | ICD-10-CM | POA: Diagnosis not present

## 2021-06-03 DIAGNOSIS — I10 Essential (primary) hypertension: Secondary | ICD-10-CM | POA: Diagnosis not present

## 2021-06-03 DIAGNOSIS — R7303 Prediabetes: Secondary | ICD-10-CM | POA: Diagnosis not present

## 2021-06-03 DIAGNOSIS — R5383 Other fatigue: Secondary | ICD-10-CM | POA: Diagnosis not present

## 2021-06-03 DIAGNOSIS — R739 Hyperglycemia, unspecified: Secondary | ICD-10-CM | POA: Diagnosis not present

## 2021-06-10 ENCOUNTER — Ambulatory Visit (HOSPITAL_COMMUNITY)
Admission: RE | Admit: 2021-06-10 | Discharge: 2021-06-10 | Disposition: A | Discharge status: Home / Self Care | Payer: 59 | Source: Ambulatory Visit | Attending: Obstetrics | Admitting: Obstetrics

## 2021-06-10 ENCOUNTER — Other Ambulatory Visit: Payer: Self-pay

## 2021-06-10 DIAGNOSIS — S3993XA Unspecified injury of pelvis, initial encounter: Secondary | ICD-10-CM | POA: Diagnosis not present

## 2021-06-10 DIAGNOSIS — R102 Pelvic and perineal pain: Secondary | ICD-10-CM | POA: Diagnosis not present

## 2021-06-11 ENCOUNTER — Encounter: Payer: Self-pay | Admitting: Obstetrics

## 2021-06-11 NOTE — Telephone Encounter (Signed)
Patient is scheduled for 06/28/21. No openings in [facility] scheduled. Will add patient to cancellation list if we have any openings

## 2021-06-22 ENCOUNTER — Other Ambulatory Visit: Payer: Self-pay

## 2021-06-22 ENCOUNTER — Ambulatory Visit: Payer: BC Managed Care – PPO

## 2021-06-28 ENCOUNTER — Ambulatory Visit: Payer: 59 | Admitting: Obstetrics & Gynecology

## 2021-06-28 ENCOUNTER — Other Ambulatory Visit: Payer: Self-pay

## 2021-07-05 ENCOUNTER — Other Ambulatory Visit: Payer: Self-pay

## 2021-07-05 MED ORDER — SAXENDA 18 MG/3ML ~~LOC~~ SOPN
PEN_INJECTOR | SUBCUTANEOUS | 11 refills | Status: DC
  Filled 2021-07-05: qty 15, 30d supply, fill #0
  Filled 2021-07-13: qty 15, 30d supply, fill #1
  Filled 2021-08-16: qty 15, 30d supply, fill #0
  Filled 2021-09-27 – 2022-03-23 (×2): qty 15, 30d supply, fill #1
  Filled 2022-05-08: qty 15, 30d supply, fill #2

## 2021-07-08 ENCOUNTER — Other Ambulatory Visit: Payer: Self-pay

## 2021-07-09 ENCOUNTER — Encounter: Payer: Self-pay | Admitting: Obstetrics & Gynecology

## 2021-07-09 ENCOUNTER — Ambulatory Visit: Payer: 59 | Admitting: Obstetrics & Gynecology

## 2021-07-09 ENCOUNTER — Other Ambulatory Visit: Payer: Self-pay

## 2021-07-09 VITALS — BP 120/80 | Ht 68.0 in | Wt 240.0 lb

## 2021-07-09 DIAGNOSIS — R102 Pelvic and perineal pain: Secondary | ICD-10-CM

## 2021-07-09 NOTE — Progress Notes (Signed)
  HPI: Abdominal Pain Patient presents for evaluation of abdominal pain. The pain is described as sharp, shooting, and stabbing, and is 3/10 in intensity., most days of an intermittent nature for the past 6 years.  It was intensified after a trauma 3 weeks ago, for a few days it was 10/10, then reverted back to her chronic pain level.  Pain is located in the RLQ and really groin area and is deep  area without radiation. Symptoms have been unchanged since. Aggravating factors: not associated w activity or menses or sex. Alleviating factors: none. Associated symptoms: none. The patient denies constipation, diarrhea, dysuria, fever, frequency, nausea, and vomiting. Risk factors for pelvic/abdominal pain include  prior CS but pain started prior to CS .  Irreg rare cycles.  Desires future pregnancy after weight loss (has lost 20 lbs so far on Saxenda and Metformin).  Ultrasound demonstrates no masses seen, no cysts  PMHx: She  has a past medical history of Abdominal pain, generalized (11/11/2014), Altered bowel function (11/11/2014), Anxiety, Bleeding per rectum (11/11/2014), Calculus of kidney (12/14/2015), Complication of anesthesia, Fatty infiltration of liver (11/11/2014), Hypertension, Occipital neuralgia, PCOS (polycystic ovarian syndrome), PONV (postoperative nausea and vomiting), and Upper airway cough syndrome (08/17/2015). Also,  has a past surgical history that includes Cholecystectomy; Laparoscopic cholecystectomy (06/2009); STENT PLACE LEFT URETER ([facility] HX) (Left, 11/2012); Colonoscopy (09/2014); Cesarean section (N/A, 06/22/2016); Dilation and curettage of uterus (2013); Hysteroscopy (2015); hemmorhoidectomy (2012); Eye surgery; Esophagogastroduodenoscopy endoscopy; Achilles tendon surgery (Left, 04/20/2018); Ostectomy (Left, 04/20/2018); LASIK (Bilateral); Endoscopic turbinate reduction (Bilateral, 07/17/2019); Maxillary antrostomy (Right, 07/17/2019); and Balloon sinuplasty (Left, 07/17/2019)., family history  includes Bladder Cancer in her maternal grandfather; Breast cancer in her maternal grandmother; Diabetes in her maternal grandmother; Heart disease in her father and maternal grandfather; Hyperlipidemia in her father; Hypertension in her brother and mother; Kidney Stones in her mother.,  reports that she has never smoked. She has never used smokeless tobacco. She reports that she does not currently use alcohol. She reports that she does not use drugs.  She has a current medication list which includes the following prescription(s): albuterol, albuterol, alprazolam, ascorbic acid, azelastine, budesonide, buspirone, cholecalciferol, drospirenone-ethinyl estradiol, epinephrine, fluticasone, hydrochlorothiazide, unifine pentips, krill oil, saxenda, losartan, metformin, metformin, multiple vitamins-minerals, nystatin-triamcinolone, ondansetron, oxycodone, prenatal (w/iron & fa), semaglutide-weight management, saline wound wash, zinc gluconate, fluoxetine, and phentermine. Also, is allergic to levofloxacin, reglan [metoclopramide], other, almond (diagnostic), bactrim [sulfamethoxazole-trimethoprim], equal [aspartame], sulfa antibiotics, and tape.  Review of Systems  All other systems reviewed and are negative.  Objective: BP 120/80   Ht 5\' 8"  (1.727 m)   Wt 240 lb (108.9 kg)   BMI 36.49 kg/m   Physical examination Constitutional NAD, Conversant  Skin No rashes, lesions or ulceration.   Extremities: Moves all appropriately.  Normal ROM for age. No lymphadenopathy.  Neuro: Grossly intact  Psych: Oriented to PPT.  Normal mood. Normal affect.   Assessment:  Pelvic pain Chronic Referral to pain clinic    To assess for etiology and therapy, such as injection Consider referral to gen surgery to assess for hernia or other etiology No apparent GYN etiology  Cont weight loss journey Pregnancy care discussed once achieved   A total of 25 minutes were spent face-to-face with the patient as well as  preparation, review, communication, and documentation during this encounter.   , MD, Annamarie Major Ob/Gyn, [facility] 07/09/2021  3:31 PM

## 2021-07-13 ENCOUNTER — Other Ambulatory Visit: Payer: Self-pay

## 2021-07-19 ENCOUNTER — Other Ambulatory Visit: Payer: Self-pay

## 2021-07-20 ENCOUNTER — Other Ambulatory Visit: Payer: Self-pay

## 2021-07-21 ENCOUNTER — Other Ambulatory Visit: Payer: Self-pay

## 2021-07-21 MED ORDER — FLUOXETINE HCL 10 MG PO TABS
ORAL_TABLET | ORAL | 2 refills | Status: DC
  Filled 2021-07-21: qty 270, 90d supply, fill #0

## 2021-07-22 ENCOUNTER — Other Ambulatory Visit: Payer: Self-pay

## 2021-08-02 ENCOUNTER — Other Ambulatory Visit: Payer: Self-pay

## 2021-08-02 DIAGNOSIS — B349 Viral infection, unspecified: Secondary | ICD-10-CM | POA: Diagnosis not present

## 2021-08-02 DIAGNOSIS — Z20828 Contact with and (suspected) exposure to other viral communicable diseases: Secondary | ICD-10-CM | POA: Diagnosis not present

## 2021-08-03 ENCOUNTER — Telehealth: Payer: 59 | Admitting: Nurse Practitioner

## 2021-08-03 DIAGNOSIS — U071 COVID-19: Secondary | ICD-10-CM

## 2021-08-03 DIAGNOSIS — Z20822 Contact with and (suspected) exposure to covid-19: Secondary | ICD-10-CM

## 2021-08-03 MED ORDER — ALBUTEROL SULFATE HFA 108 (90 BASE) MCG/ACT IN AERS: 2.0000 | INHALATION_SPRAY | Freq: Four times a day (QID) | RESPIRATORY_TRACT | 0 refills | Status: AC | PRN

## 2021-08-03 MED ORDER — MOLNUPIRAVIR EUA 200MG CAPSULE: 4.0000 | ORAL_CAPSULE | Freq: Two times a day (BID) | ORAL | 0 refills | Status: AC

## 2021-08-03 MED ORDER — FLUTICASONE PROPIONATE HFA 110 MCG/ACT IN AERO: 2.0000 | INHALATION_SPRAY | Freq: Two times a day (BID) | RESPIRATORY_TRACT | 0 refills | Status: AC

## 2021-08-03 NOTE — Progress Notes (Signed)
Virtual Visit Consent   Melissa Martinez, you are scheduled for a virtual visit with a [facility] provider today.     Just as with appointments in the office, your consent must be obtained to participate.  Your consent will be active for this visit and any virtual visit you may have with one of our providers in the next 365 days.     If you have a MyChart account, a copy of this consent can be sent to you electronically.  All virtual visits are billed to your insurance company just like a traditional visit in the office.    As this is a virtual visit, video technology does not allow for your provider to perform a traditional examination.  This may limit your provider's ability to fully assess your condition.  If your provider identifies any concerns that need to be evaluated in person or the need to arrange testing (such as labs, EKG, etc.), we will make arrangements to do so.     Although advances in technology are sophisticated, we cannot ensure that it will always work on either your end or our end.  If the connection with a video visit is poor, the visit may have to be switched to a telephone visit.  With either a video or telephone visit, we are not always able to ensure that we have a secure connection.     I need to obtain your verbal consent now.   Are you willing to proceed with your visit today?    Melissa Martinez has provided verbal consent on 08/03/2021 for a virtual visit (video or telephone).   Viviano Simas, FNP   Date: 08/03/2021 1:31 PM   Virtual Visit via Video Note   I, Viviano Simas, connected with  Melissa Martinez  ([id number], 1985-12-11) on 08/03/21 at  1:30 PM EDT by a video-enabled telemedicine application and verified that I am speaking with the correct person using two identifiers.  Location: Patient: Virtual Visit Location Patient: Home Provider: Virtual Visit Location Provider: Office/Clinic   I discussed the limitations of evaluation and management by telemedicine and  the availability of in person appointments. The patient expressed understanding and agreed to proceed.    History of Present Illness: Melissa Martinez is a 35 y.o. who identifies as a female who was assigned female at birth, and is being seen today after getting her PCR + results for COVID today  Symptom onset 3 days ago rapid test was negative at that time She has had a Low grade fever, sore throat   She has not had COVID in the past  She has been vaccinated x4  History of  Sinus surgery  Chronic sinusitis & bronchitis  Used inhalers for that without dx of asthma   Hx: PCOS  HTN  Problems:  Patient Active Problem List   Diagnosis Date Noted   Allergy to almonds 08/23/2018   Vaccine reaction, initial encounter 08/23/2018   Allergic rhinitis 08/23/2018   Chronic neck pain 04/16/2018   Headache disorder 04/16/2018   Asymptomatic hyperuricemia 02/05/2018   Chronic pain of left heel 02/05/2018   Screening for cervical cancer 08/01/2017   Nausea 07/03/2017   Dehydration 06/12/2017   Adenitis 05/23/2017   Adenitis 05/23/2017   Closed Colles' fracture of left radius 10/17/2016   Kidney stones 12/14/2015   PCOS (polycystic ovarian syndrome) 12/14/2015   Upper airway cough syndrome 08/17/2015   Upper airway cough syndrome 08/17/2015   Abdominal pain, chronic, generalized 11/11/2014  Bleeding per rectum 11/11/2014   Change in bowel habits 11/11/2014   Fatty infiltration of liver 11/11/2014   Bright red rectal bleeding 11/11/2014    Allergies:  Allergies  Allergen Reactions   Levofloxacin Anaphylaxis, Itching and Other (See Comments)    Throat tightening, generalized itching, redness   Reglan [Metoclopramide] Anxiety    Causes paranoia    Other Other (See Comments)    splenda and equal- itching, hives   Almond (Diagnostic) Hives   Bactrim [Sulfamethoxazole-Trimethoprim] Itching    Also redness   Equal [Aspartame] Itching   Sulfa Antibiotics Other (See Comments)    Redness,  hives, itching , skin felt on fire   Tape Hives   Medications:  Current Outpatient Medications:    albuterol (VENTOLIN HFA) 108 (90 Base) MCG/ACT inhaler, Inhale into the lungs., Disp: , Rfl:    albuterol (VENTOLIN HFA) 108 (90 Base) MCG/ACT inhaler, Inhale 2 puffs into the lungs every 6 (six) hours as needed for wheezing or shortness of breath., Disp: 8.5 g, Rfl: 0   ALPRAZolam (XANAX) 0.25 MG tablet, alprazolam 0.25 mg tablet, Disp: , Rfl:    ascorbic acid (VITAMIN C) 500 MG tablet, Take by mouth., Disp: , Rfl:    azelastine (ASTELIN) 0.1 % nasal spray, , Disp: , Rfl:    budesonide (PULMICORT) 1 MG/2ML nebulizer solution, USE 1 VIAL VIA NEBULIZER TWICE A DAY AS DIRECTED, Disp: 120 mL, Rfl: 6   busPIRone (BUSPAR) 5 MG tablet, Take 1 tablet (5 mg total) by mouth 2 (two) times daily, Disp: 60 tablet, Rfl: 11   Cholecalciferol 50 MCG (2000 UT) CAPS, Take by mouth., Disp: , Rfl:    drospirenone-ethinyl estradiol (YAZ) 3-0.02 MG tablet, TAKE 1 TABLET BY MOUTH ONCE DAILY, Disp: 252 tablet, Rfl: 0   EPINEPHrine 0.3 mg/0.3 mL IJ SOAJ injection, INJECT INTRAMUSCULARLY IN THE EVENT OF AN EMERGENT ALLERGIC REACTION WITH DIFFICULTY BREATHING., Disp: , Rfl:    FLUoxetine (PROZAC) 10 MG tablet, TAKE 3 TABLETS (30MG ) BY MOUTH DAILY, Disp: 270 tablet, Rfl: 2   FLUoxetine (PROZAC) 10 MG tablet, TAKE 3 TABLETS (30MG ) BY MOUTH DAILY, Disp: 270 tablet, Rfl: 2   fluticasone (FLOVENT HFA) 110 MCG/ACT inhaler, Inhale 2 puffs into the lungs in the morning and at bedtime., Disp: 12 g, Rfl: 12   hydrochlorothiazide (HYDRODIURIL) 25 MG tablet, TAKE 1 TABLET BY MOUTH ONCE DAILY (2-3 DAYS AS DIRECTED), Disp: 30 tablet, Rfl: 11   Insulin Pen Needle (UNIFINE PENTIPS) 31G X 5 MM MISC, Use as directed with Saxenda, Disp: 100 each, Rfl: 0   KRILL OIL PO, Take 1 capsule by mouth daily. , Disp: , Rfl:    Liraglutide -Weight Management (SAXENDA) 18 MG/3ML SOPN, Inject 3 mg subcutaneously once daily, Disp: 15 mL, Rfl: 11    losartan (COZAAR) 50 MG tablet, Take 1 tablet (50 mg total) by mouth once daily, Disp: 30 tablet, Rfl: 11   metFORMIN (GLUCOPHAGE-XR) 500 MG 24 hr tablet, Take 1 tablet by mouth 2 (two) times daily., Disp: , Rfl:    metFORMIN (GLUCOPHAGE-XR) 500 MG 24 hr tablet, Take 1 tablet by mouth twice a day, Disp: 240 tablet, Rfl: 0   Multiple Vitamins-Minerals (WOMENS MULTIVITAMIN PO), Take by mouth., Disp: , Rfl:    nystatin-triamcinolone (MYCOLOG II) cream, Apply 1 application topically 2 (two) times daily., Disp: 30 g, Rfl: 1   ondansetron (ZOFRAN-ODT) 4 MG disintegrating tablet, Take 4 mg by mouth every 8 (eight) hours as needed., Disp: , Rfl:    oxyCODONE (  ROXICODONE) 5 MG immediate release tablet, Take 1 tablet (5 mg total) by mouth every 4 (four) hours as needed for severe pain., Disp: 15 tablet, Rfl: 0   phentermine (ADIPEX-P) 37.5 MG tablet, TAKE 1/2 TO 1 TABLET BY MOUTH DAILY EVERY MORNING BEFORE BREAKFAST, Disp: 30 tablet, Rfl: 5   Prenatal Multivit-Min-Fe-FA (PRENATAL, W/IRON & FA,) 27-0.8 MG TABS, Take 1 tablet by mouth daily., Disp: , Rfl:    Semaglutide-Weight Management 0.25 MG/0.5ML SOAJ, Inject 0.25 mg into the skin once a week., Disp: 2 mL, Rfl: 1   SODIUM CHLORIDE, EXTERNAL, (SALINE WOUND WASH) 0.9 % SOLN, Apply topically 2 (two) times daily., Disp: , Rfl:    zinc gluconate 50 MG tablet, Take by mouth., Disp: , Rfl:   Observations/Objective: Patient is well-developed, well-nourished in no acute distress.  Resting comfortably at home.  Head is normocephalic, atraumatic.  No labored breathing.  Speech is clear and coherent with logical content.  Patient is alert and oriented at baseline.    Assessment and Plan:  COVID-19  - molnupiravir EUA (LAGEVRIO) 200 mg CAPS capsule; Take 4 capsules (800 mg total) by mouth 2 (two) times daily for 5 days.  Dispense: 40 capsule; Refill: 0 - albuterol (VENTOLIN HFA) 108 (90 Base) MCG/ACT inhaler; Inhale 2 puffs into the lungs every 6 (six) hours  as needed for wheezing or shortness of breath.  Dispense: 8 g; Refill: 0 - fluticasone (FLOVENT HFA) 110 MCG/ACT inhaler; Inhale 2 puffs into the lungs in the morning and at bedtime.  Dispense: 12 g; Refill: 0    Continue to manage symptoms with over the counter medications. Recommended Mucinex for cough and congestion support Push fluids Rest Assure adequate protein intake for recovery  Follow Up Instructions: I discussed the assessment and treatment plan with the patient. The patient was provided an opportunity to ask questions and all were answered. The patient agreed with the plan and demonstrated an understanding of the instructions.  A copy of instructions were sent to the patient via MyChart.  The patient was advised to call back or seek an in-person evaluation if the symptoms worsen or if the condition fails to improve as anticipated.  Time:  I spent 10 minutes with the patient via telehealth technology discussing the above problems/concerns.    Viviano Simas, FNP

## 2021-08-03 NOTE — Patient Instructions (Signed)
You are being prescribed MOLNUPIRAVIR for COVID-19 infection.  ° ° °Please call the pharmacy or go through the drive through vs going inside if you are picking up the mediation yourself to prevent further spread. If prescribed to a [facility] affiliated pharmacy, a pharmacist will bring the medication out to your car. ° ° °ADMINISTRATION INSTRUCTIONS: °Take with or without food. Swallow the tablets whole. Don't chew, crush, or break the medications because it might not work as well ° °For each dose of the medication, you should be taking FOUR tablets at one time, TWICE a day  ° °Finish your full five-day course of Molnupiravir even if you feel better before you're done. Stopping this medication too early can make it less effective to prevent severe illness related to COVID19.   ° °Molnupiravir is prescribed for YOU ONLY. Don't share it with others, even if they have similar symptoms as you. This medication might not be right for everyone.  ° °Make sure to take steps to protect yourself and others while you're taking this medication in order to get well soon and to prevent others from getting sick with COVID-19. ° ° °**If you are of childbearing potential (any gender) - it is advised to not get pregnant while taking this medication and recommended that condoms are used for female partners the next 3 months after taking the medication out of extreme caution  ° ° °COMMON SIDE EFFECTS: °Diarrhea °Nausea  °Dizziness ° ° ° °If your COVID-19 symptoms get worse, get medical help right away. Call 911 if you experience symptoms such as worsening cough, trouble breathing, chest pain that doesn't go away, confusion, a hard time staying awake, and pale or blue-colored skin. °This medication won't prevent all COVID-19 cases from getting worse.  ° ° °

## 2021-08-05 ENCOUNTER — Encounter: Payer: Self-pay | Admitting: Nurse Practitioner

## 2021-08-06 ENCOUNTER — Encounter: Payer: Self-pay | Admitting: Nurse Practitioner

## 2021-08-16 ENCOUNTER — Other Ambulatory Visit (HOSPITAL_COMMUNITY): Payer: Self-pay

## 2021-08-16 ENCOUNTER — Other Ambulatory Visit: Payer: Self-pay | Admitting: Obstetrics & Gynecology

## 2021-08-16 MED ORDER — DROSPIRENONE-ETHINYL ESTRADIOL 3-0.02 MG PO TABS
1.0000 | ORAL_TABLET | Freq: Every day | ORAL | 1 refills | Status: DC
  Filled 2021-08-16: qty 84, 84d supply, fill #0
  Filled 2021-11-01: qty 84, 84d supply, fill #1

## 2021-08-16 MED FILL — Budesonide Inhalation Susp 1 MG/2ML: RESPIRATORY_TRACT | 30 days supply | Qty: 60 | Fill #0 | Status: AC

## 2021-08-17 ENCOUNTER — Other Ambulatory Visit (HOSPITAL_COMMUNITY): Payer: Self-pay

## 2021-08-17 MED ORDER — FLUOXETINE HCL 10 MG PO TABS
30.0000 mg | ORAL_TABLET | Freq: Every day | ORAL | 2 refills | Status: DC
  Filled 2021-08-17 – 2021-11-01 (×4): qty 270, 90d supply, fill #0
  Filled 2022-02-09: qty 270, 90d supply, fill #1
  Filled 2022-05-08: qty 270, 90d supply, fill #2

## 2021-08-17 MED ORDER — BUSPIRONE HCL 5 MG PO TABS
5.0000 mg | ORAL_TABLET | Freq: Two times a day (BID) | ORAL | 11 refills | Status: DC
  Filled 2021-08-17 – 2021-09-27 (×2): qty 60, 30d supply, fill #0
  Filled 2021-11-01: qty 60, 30d supply, fill #1
  Filled 2021-12-01: qty 60, 30d supply, fill #2
  Filled 2022-01-17: qty 60, 30d supply, fill #3
  Filled 2022-02-21: qty 60, 30d supply, fill #4
  Filled 2022-03-23: qty 60, 30d supply, fill #5
  Filled 2022-05-08: qty 60, 30d supply, fill #6
  Filled 2022-06-15: qty 60, 30d supply, fill #7

## 2021-08-18 ENCOUNTER — Other Ambulatory Visit (HOSPITAL_COMMUNITY): Payer: Self-pay

## 2021-08-18 MED ORDER — INSULIN PEN NEEDLE 31G X 5 MM MISC
12 refills | Status: DC
  Filled 2021-08-18 – 2021-09-06 (×3): qty 100, 90d supply, fill #0
  Filled 2021-12-01: qty 100, 90d supply, fill #1
  Filled 2022-05-08: qty 100, 90d supply, fill #2

## 2021-08-20 ENCOUNTER — Telehealth: Payer: 59 | Admitting: Family Medicine

## 2021-08-20 DIAGNOSIS — J0140 Acute pansinusitis, unspecified: Secondary | ICD-10-CM

## 2021-08-20 DIAGNOSIS — T3695XA Adverse effect of unspecified systemic antibiotic, initial encounter: Secondary | ICD-10-CM

## 2021-08-20 DIAGNOSIS — B379 Candidiasis, unspecified: Secondary | ICD-10-CM

## 2021-08-20 MED ORDER — FLUCONAZOLE 150 MG PO TABS: 150.0000 mg | ORAL_TABLET | Freq: Once | ORAL | 0 refills | Status: AC

## 2021-08-20 MED ORDER — AMOXICILLIN-POT CLAVULANATE 875-125 MG PO TABS: 1.0000 | ORAL_TABLET | Freq: Two times a day (BID) | ORAL | 0 refills | Status: AC

## 2021-08-20 NOTE — Progress Notes (Signed)
Virtual Visit Consent   Melissa Martinez, you are scheduled for a virtual visit with a [facility] provider today.     Just as with appointments in the office, your consent must be obtained to participate.  Your consent will be active for this visit and any virtual visit you may have with one of our providers in the next 365 days.     If you have a MyChart account, a copy of this consent can be sent to you electronically.  All virtual visits are billed to your insurance company just like a traditional visit in the office.    As this is a virtual visit, video technology does not allow for your provider to perform a traditional examination.  This may limit your provider's ability to fully assess your condition.  If your provider identifies any concerns that need to be evaluated in person or the need to arrange testing (such as labs, EKG, etc.), we will make arrangements to do so.     Although advances in technology are sophisticated, we cannot ensure that it will always work on either your end or our end.  If the connection with a video visit is poor, the visit may have to be switched to a telephone visit.  With either a video or telephone visit, we are not always able to ensure that we have a secure connection.     I need to obtain your verbal consent now.   Are you willing to proceed with your visit today?    Melissa Martinez has provided verbal consent on 08/20/2021 for a virtual visit (video or telephone).   Freddy Finner, NP   Date: 08/20/2021 12:15 PM   Virtual Visit via Video Note   I, Freddy Finner, connected with  Melissa Martinez  ([id number], 06-20-1986) on 08/20/21 at 12:15 PM EDT by a video-enabled telemedicine application and verified that I am speaking with the correct person using two identifiers.  Location: Patient: Virtual Visit Location Patient: Home Provider: Virtual Visit Location Provider: Home Office   I discussed the limitations of evaluation and management by telemedicine and  the availability of in person appointments. The patient expressed understanding and agreed to proceed.    History of Present Illness: Melissa Martinez is a 35 y.o. who identifies as a female who was assigned female at birth, and is being seen today for secondary cold symptoms post covid.  "Covid pos 09/18, off quarantine 09/28. Persistent thick yellow/green vicious mucous, post nasal drip, sore throat since covid. Unresolved with OTC meds and nasal saline irrigant q 9 days. Pressure in sinuses, progressed to left parietal lymph nodes yesterday, ears feel as if they have fluid on them but no pain. Lungs sounds clear bil. Generalized fatigue"  Denies fevers, chills, chest pain.    Problems:  Patient Active Problem List   Diagnosis Date Noted   Allergy to almonds 08/23/2018   Vaccine reaction, initial encounter 08/23/2018   Allergic rhinitis 08/23/2018   Chronic neck pain 04/16/2018   Headache disorder 04/16/2018   Asymptomatic hyperuricemia 02/05/2018   Chronic pain of left heel 02/05/2018   Screening for cervical cancer 08/01/2017   Nausea 07/03/2017   Dehydration 06/12/2017   Adenitis 05/23/2017   Adenitis 05/23/2017   Closed Colles' fracture of left radius 10/17/2016   Kidney stones 12/14/2015   PCOS (polycystic ovarian syndrome) 12/14/2015   Upper airway cough syndrome 08/17/2015   Upper airway cough syndrome 08/17/2015   Abdominal pain, chronic, generalized  11/11/2014   Bleeding per rectum 11/11/2014   Change in bowel habits 11/11/2014   Fatty infiltration of liver 11/11/2014   Bright red rectal bleeding 11/11/2014    Allergies:  Allergies  Allergen Reactions   Levofloxacin Anaphylaxis, Itching and Other (See Comments)    Throat tightening, generalized itching, redness   Reglan [Metoclopramide] Anxiety    Causes paranoia    Other Other (See Comments)    splenda and equal- itching, hives   Almond (Diagnostic) Hives   Bactrim [Sulfamethoxazole-Trimethoprim] Itching    Also  redness   Equal [Aspartame] Itching   Sulfa Antibiotics Other (See Comments)    Redness, hives, itching , skin felt on fire   Tape Hives   Medications:  Current Outpatient Medications:    albuterol (VENTOLIN HFA) 108 (90 Base) MCG/ACT inhaler, Inhale 2 puffs into the lungs every 6 (six) hours as needed for wheezing or shortness of breath., Disp: 8 g, Rfl: 0   ALPRAZolam (XANAX) 0.25 MG tablet, alprazolam 0.25 mg tablet, Disp: , Rfl:    ascorbic acid (VITAMIN C) 500 MG tablet, Take by mouth., Disp: , Rfl:    budesonide (PULMICORT) 1 MG/2ML nebulizer solution, USE 1 VIAL VIA NEBULIZER TWICE A DAY AS DIRECTED, Disp: 120 mL, Rfl: 6   busPIRone (BUSPAR) 5 MG tablet, Take 1 tablet by mouth 2 (two) times daily., Disp: , Rfl:    busPIRone (BUSPAR) 5 MG tablet, Take 1 tablet (5 mg total) by mouth 2 (two) times daily, Disp: 60 tablet, Rfl: 11   Cholecalciferol 50 MCG (2000 UT) CAPS, Take by mouth., Disp: , Rfl:    drospirenone-ethinyl estradiol (JASMIEL) 3-0.02 MG tablet, Take 1 tablet by mouth daily., Disp: 84 tablet, Rfl: 1   EPINEPHrine 0.3 mg/0.3 mL IJ SOAJ injection, INJECT INTRAMUSCULARLY IN THE EVENT OF AN EMERGENT ALLERGIC REACTION WITH DIFFICULTY BREATHING., Disp: , Rfl:    FLUoxetine (PROZAC) 10 MG tablet, TAKE 3 TABLETS (30MG ) BY MOUTH DAILY, Disp: 270 tablet, Rfl: 2   FLUoxetine (PROZAC) 10 MG tablet, Take 3 tablets (30 mg total) by mouth daily., Disp: 270 tablet, Rfl: 2   fluticasone (FLOVENT HFA) 110 MCG/ACT inhaler, Inhale 2 puffs into the lungs in the morning and at bedtime., Disp: 12 g, Rfl: 0   hydrochlorothiazide (HYDRODIURIL) 25 MG tablet, TAKE 1 TABLET BY MOUTH ONCE DAILY (2-3 DAYS AS DIRECTED), Disp: 30 tablet, Rfl: 11   Insulin Pen Needle (UNIFINE PENTIPS) 31G X 5 MM MISC, Use as directed with Saxenda, Disp: 100 each, Rfl: 0   Insulin Pen Needle 31G X 5 MM MISC, Use as directed, Disp: 100 each, Rfl: 12   KRILL OIL PO, Take 1 capsule by mouth daily. , Disp: , Rfl:    Liraglutide  -Weight Management (SAXENDA) 18 MG/3ML SOPN, Inject 3 mg subcutaneously once daily, Disp: 15 mL, Rfl: 11   losartan (COZAAR) 50 MG tablet, Take 1 tablet (50 mg total) by mouth once daily, Disp: 30 tablet, Rfl: 11   metFORMIN (GLUCOPHAGE-XR) 500 MG 24 hr tablet, Take 1 tablet by mouth 2 (two) times daily., Disp: , Rfl:    Multiple Vitamins-Minerals (WOMENS MULTIVITAMIN PO), Take by mouth., Disp: , Rfl:    phentermine (ADIPEX-P) 37.5 MG tablet, TAKE 1/2 TO 1 TABLET BY MOUTH DAILY EVERY MORNING BEFORE BREAKFAST, Disp: 30 tablet, Rfl: 5   Prenatal Multivit-Min-Fe-FA (PRENATAL, W/IRON & FA,) 27-0.8 MG TABS, Take 1 tablet by mouth daily., Disp: , Rfl:    Semaglutide-Weight Management 0.25 MG/0.5ML SOAJ, Inject 0.25 mg into  the skin once a week., Disp: 2 mL, Rfl: 1   SODIUM CHLORIDE, EXTERNAL, (SALINE WOUND WASH) 0.9 % SOLN, Apply topically 2 (two) times daily., Disp: , Rfl:    zinc gluconate 50 MG tablet, Take by mouth., Disp: , Rfl:   Observations/Objective: Patient is well-developed, well-nourished in no acute distress.  Resting comfortably  at home.  Head is normocephalic, atraumatic.  No labored breathing.  Speech is clear and coherent with logical content.  Patient is alert and oriented at baseline.    Assessment and Plan:  1. Acute non-recurrent pansinusitis Post covid sinus infection   Aug provided  OTC measures reviewed   Reviewed side effects, risks and benefits of medication.   Patient acknowledged agreement and understanding of the plan.   - amoxicillin-clavulanate (AUGMENTIN) 875-125 MG tablet; Take 1 tablet by mouth 2 (two) times daily for 10 days.  Dispense: 20 tablet; Refill: 0  2. Antibiotic-induced yeast infection  - fluconazole (DIFLUCAN) 150 MG tablet; Take 1 tablet (150 mg total) by mouth once for 1 dose.  Dispense: 1 tablet; Refill: 0   I discussed the assessment and treatment plan with the patient. The patient was provided an opportunity to ask questions and all  were answered. The patient agreed with the plan and demonstrated an understanding of the instructions.   The patient was advised to call back or seek an in-person evaluation if the symptoms worsen or if the condition fails to improve as anticipated.   The above assessment and management plan was discussed with the patient. The patient verbalized understanding of and has agreed to the management plan. Patient is aware to call the clinic if symptoms persist or worsen. Patient is aware when to return to the clinic for a follow-up visit. Patient educated on when it is appropriate to go to the emergency department.   Follow Up Instructions: I discussed the assessment and treatment plan with the patient. The patient was provided an opportunity to ask questions and all were answered. The patient agreed with the plan and demonstrated an understanding of the instructions.  A copy of instructions were sent to the patient via MyChart unless otherwise noted below.    The patient was advised to call back or seek an in-person evaluation if the symptoms worsen or if the condition fails to improve as anticipated.  Time:  I spent 10 minutes with the patient via telehealth technology discussing the above problems/concerns.    Freddy Finner, NP

## 2021-08-20 NOTE — Patient Instructions (Signed)
I appreciate the opportunity to provide you with care for your health and wellness.  You sound like you have a secondary infection related to the covid virus.  I hope you feel better. Take medication as directed  Please continue to practice social distancing to keep you, your family, and our community safe.  If you must go out, please wear a mask and practice good handwashing.  Have a wonderful day. With Gratitude, Tereasa Coop, DNP, AGNP-BC

## 2021-08-24 ENCOUNTER — Other Ambulatory Visit: Payer: Self-pay

## 2021-08-24 MED ORDER — PHENTERMINE HCL 37.5 MG PO TABS
ORAL_TABLET | ORAL | 5 refills | Status: DC
  Filled 2021-08-24: qty 30, 30d supply, fill #0
  Filled 2021-09-27: qty 30, 30d supply, fill #1
  Filled 2021-11-01: qty 30, 30d supply, fill #2
  Filled 2021-12-01: qty 30, 30d supply, fill #3
  Filled 2022-01-17: qty 30, 30d supply, fill #4

## 2021-08-26 ENCOUNTER — Other Ambulatory Visit (HOSPITAL_COMMUNITY): Payer: Self-pay

## 2021-09-01 ENCOUNTER — Other Ambulatory Visit (HOSPITAL_COMMUNITY): Payer: Self-pay

## 2021-09-06 ENCOUNTER — Other Ambulatory Visit (HOSPITAL_COMMUNITY): Payer: Self-pay

## 2021-09-07 ENCOUNTER — Other Ambulatory Visit (HOSPITAL_COMMUNITY): Payer: Self-pay

## 2021-09-07 MED ORDER — UNIFINE PENTIPS 31G X 8 MM MISC
0 refills | Status: DC
  Filled 2021-09-07: qty 100, 90d supply, fill #0
  Filled 2022-05-08: qty 100, 100d supply, fill #0

## 2021-09-27 ENCOUNTER — Other Ambulatory Visit (HOSPITAL_COMMUNITY): Payer: Self-pay

## 2021-09-30 ENCOUNTER — Other Ambulatory Visit: Payer: Self-pay

## 2021-09-30 ENCOUNTER — Other Ambulatory Visit (HOSPITAL_COMMUNITY): Payer: Self-pay

## 2021-09-30 DIAGNOSIS — E669 Obesity, unspecified: Secondary | ICD-10-CM | POA: Diagnosis not present

## 2021-09-30 DIAGNOSIS — I10 Essential (primary) hypertension: Secondary | ICD-10-CM | POA: Diagnosis not present

## 2021-09-30 DIAGNOSIS — R7303 Prediabetes: Secondary | ICD-10-CM | POA: Diagnosis not present

## 2021-09-30 MED ORDER — SAXENDA 18 MG/3ML ~~LOC~~ SOPN
PEN_INJECTOR | SUBCUTANEOUS | 11 refills | Status: DC
  Filled 2021-09-30 – 2021-10-01 (×3): qty 15, 30d supply, fill #0
  Filled 2021-11-01: qty 15, 30d supply, fill #1
  Filled 2021-12-01: qty 15, 30d supply, fill #2
  Filled 2022-01-17: qty 15, 30d supply, fill #3
  Filled 2022-02-21: qty 15, 30d supply, fill #4

## 2021-10-01 ENCOUNTER — Other Ambulatory Visit: Payer: Self-pay

## 2021-10-04 ENCOUNTER — Other Ambulatory Visit (HOSPITAL_COMMUNITY): Payer: Self-pay

## 2021-10-12 ENCOUNTER — Other Ambulatory Visit (HOSPITAL_COMMUNITY): Payer: Self-pay

## 2021-10-18 ENCOUNTER — Telehealth: Payer: 59 | Admitting: Physician Assistant

## 2021-10-18 DIAGNOSIS — H1030 Unspecified acute conjunctivitis, unspecified eye: Secondary | ICD-10-CM | POA: Diagnosis not present

## 2021-10-18 DIAGNOSIS — H6691 Otitis media, unspecified, right ear: Secondary | ICD-10-CM | POA: Diagnosis not present

## 2021-10-18 MED ORDER — POLYMYXIN B-TRIMETHOPRIM 10000-0.1 UNIT/ML-% OP SOLN
1.0000 [drp] | OPHTHALMIC | 0 refills | Status: DC
Start: 2021-10-18 — End: 2021-11-04

## 2021-10-18 NOTE — Progress Notes (Signed)

## 2021-11-01 ENCOUNTER — Other Ambulatory Visit (HOSPITAL_COMMUNITY): Payer: Self-pay

## 2021-11-01 MED FILL — Budesonide Inhalation Susp 1 MG/2ML: RESPIRATORY_TRACT | 30 days supply | Qty: 60 | Fill #1 | Status: AC

## 2021-11-03 ENCOUNTER — Telehealth: Payer: 59 | Admitting: Physician Assistant

## 2021-11-03 ENCOUNTER — Other Ambulatory Visit (HOSPITAL_COMMUNITY): Payer: Self-pay

## 2021-11-03 DIAGNOSIS — J329 Chronic sinusitis, unspecified: Secondary | ICD-10-CM

## 2021-11-03 DIAGNOSIS — R42 Dizziness and giddiness: Secondary | ICD-10-CM | POA: Diagnosis not present

## 2021-11-03 DIAGNOSIS — R059 Cough, unspecified: Secondary | ICD-10-CM | POA: Diagnosis not present

## 2021-11-03 DIAGNOSIS — J029 Acute pharyngitis, unspecified: Secondary | ICD-10-CM | POA: Diagnosis not present

## 2021-11-03 DIAGNOSIS — Z20828 Contact with and (suspected) exposure to other viral communicable diseases: Secondary | ICD-10-CM | POA: Diagnosis not present

## 2021-11-03 DIAGNOSIS — R1110 Vomiting, unspecified: Secondary | ICD-10-CM | POA: Diagnosis not present

## 2021-11-03 NOTE — Progress Notes (Signed)
Based on what you shared with me, I feel your condition warrants further evaluation and I recommend that you be seen in a face to face visit.  Giving continued and deteriorating symptoms despite prior course of treatment, you will need to be re-evaluated in person, either with your PCP or at a local urgent care so you can get a good repeat examination and so proper treatment can be given. It is our telehealth policy that we do not treat if there has already been a failed treatment course.    NOTE: There will be NO CHARGE for this eVisit   If you are having a true medical emergency please call 911.      For an urgent face to face visit, [facility] has six urgent care centers for your convenience:     [facility] at [facility] Directions [phone] [address] [place], [place] [address]    [facility]) Get Driving Directions [phone] [address] [place], [place] [address]  [facility]) Get Driving Directions [phone] [address] [place],  [place]  [address]  [facility] at [facility] Get Driving Directions [phone] [address] [place], [place] [address]   [facility] at [facility] Get Driving Directions  [phone] [address] [place], [place] [address]   [facility] at [facility] Directions [phone] [address] [place], [place] [address]  Your MyChart E-visit questionnaire answers were reviewed by a board certified advanced clinical practitioner to complete your personal care plan based on your specific symptoms.  Thank you for using e-Visits.

## 2021-11-04 ENCOUNTER — Telehealth: Payer: 59 | Admitting: Physician Assistant

## 2021-11-04 DIAGNOSIS — U071 COVID-19: Secondary | ICD-10-CM

## 2021-11-04 MED ORDER — PROMETHAZINE-DM 6.25-15 MG/5ML PO SYRP
5.0000 mL | ORAL_SOLUTION | Freq: Four times a day (QID) | ORAL | 0 refills | Status: DC | PRN
Start: 2021-11-04 — End: 2024-02-13

## 2021-11-04 NOTE — Patient Instructions (Signed)
Melissa Martinez, thank you for joining Piedad Climes, PA-C for today's virtual visit.  While this provider is not your primary care provider (PCP), if your PCP is located in our provider database this encounter information will be shared with them immediately following your visit.  Consent: (Patient) Melissa Martinez provided verbal consent for this virtual visit at the beginning of the encounter.  Current Medications:  Current Outpatient Medications:    albuterol (VENTOLIN HFA) 108 (90 Base) MCG/ACT inhaler, Inhale 2 puffs into the lungs every 6 (six) hours as needed for wheezing or shortness of breath., Disp: 8 g, Rfl: 0   ALPRAZolam (XANAX) 0.25 MG tablet, alprazolam 0.25 mg tablet, Disp: , Rfl:    ascorbic acid (VITAMIN C) 500 MG tablet, Take by mouth., Disp: , Rfl:    budesonide (PULMICORT) 1 MG/2ML nebulizer solution, USE 1 VIAL VIA NEBULIZER TWICE A DAY AS DIRECTED, Disp: 120 mL, Rfl: 6   busPIRone (BUSPAR) 5 MG tablet, Take 1 tablet by mouth 2 (two) times daily., Disp: , Rfl:    busPIRone (BUSPAR) 5 MG tablet, Take 1 tablet (5 mg total) by mouth 2 (two) times daily, Disp: 60 tablet, Rfl: 11   Cholecalciferol 50 MCG (2000 UT) CAPS, Take by mouth., Disp: , Rfl:    drospirenone-ethinyl estradiol (JASMIEL) 3-0.02 MG tablet, Take 1 tablet by mouth daily., Disp: 84 tablet, Rfl: 1   EPINEPHrine 0.3 mg/0.3 mL IJ SOAJ injection, INJECT INTRAMUSCULARLY IN THE EVENT OF AN EMERGENT ALLERGIC REACTION WITH DIFFICULTY BREATHING., Disp: , Rfl:    FLUoxetine (PROZAC) 10 MG tablet, TAKE 3 TABLETS (30MG ) BY MOUTH DAILY, Disp: 270 tablet, Rfl: 2   FLUoxetine (PROZAC) 10 MG tablet, Take 3 tablets (30 mg total) by mouth daily., Disp: 270 tablet, Rfl: 2   fluticasone (FLOVENT HFA) 110 MCG/ACT inhaler, Inhale 2 puffs into the lungs in the morning and at bedtime., Disp: 12 g, Rfl: 0   hydrochlorothiazide (HYDRODIURIL) 25 MG tablet, TAKE 1 TABLET BY MOUTH ONCE DAILY (2-3 DAYS AS DIRECTED), Disp: 30 tablet, Rfl:  11   Insulin Pen Needle (UNIFINE PENTIPS) 31G X 5 MM MISC, Use as directed with Saxenda, Disp: 100 each, Rfl: 0   Insulin Pen Needle (UNIFINE PENTIPS) 31G X 8 MM MISC, Use as directed with Saxenda., Disp: 100 each, Rfl: 0   Insulin Pen Needle 31G X 5 MM MISC, Use as directed, Disp: 100 each, Rfl: 12   KRILL OIL PO, Take 1 capsule by mouth daily. , Disp: , Rfl:    Liraglutide -Weight Management (SAXENDA) 18 MG/3ML SOPN, Inject 3 mg subcutaneously once daily, Disp: 15 mL, Rfl: 11   Liraglutide -Weight Management (SAXENDA) 18 MG/3ML SOPN, Inject 3 mg subcutaneously once daily, Disp: 15 mL, Rfl: 11   losartan (COZAAR) 50 MG tablet, Take 1 tablet (50 mg total) by mouth once daily, Disp: 30 tablet, Rfl: 11   metFORMIN (GLUCOPHAGE-XR) 500 MG 24 hr tablet, Take 1 tablet by mouth 2 (two) times daily., Disp: , Rfl:    Multiple Vitamins-Minerals (WOMENS MULTIVITAMIN PO), Take by mouth., Disp: , Rfl:    phentermine (ADIPEX-P) 37.5 MG tablet, TAKE 1/2 TO 1 TABLET BY MOUTH DAILY EVERY MORNING BEFORE BREAKFAST, Disp: 30 tablet, Rfl: 5   phentermine (ADIPEX-P) 37.5 MG tablet, Take 1 tablet (37.5 mg total) by mouth every morning before breakfast, Disp: 30 tablet, Rfl: 5   Prenatal Multivit-Min-Fe-FA (PRENATAL, W/IRON & FA,) 27-0.8 MG TABS, Take 1 tablet by mouth daily., Disp: , Rfl:  Semaglutide-Weight Management 0.25 MG/0.5ML SOAJ, Inject 0.25 mg into the skin once a week., Disp: 2 mL, Rfl: 1   SODIUM CHLORIDE, EXTERNAL, (SALINE WOUND WASH) 0.9 % SOLN, Apply topically 2 (two) times daily., Disp: , Rfl:    trimethoprim-polymyxin b (POLYTRIM) ophthalmic solution, Place 1 drop into both eyes every 4 (four) hours., Disp: 10 mL, Rfl: 0   zinc gluconate 50 MG tablet, Take by mouth., Disp: , Rfl:    Medications ordered in this encounter:  No orders of the defined types were placed in this encounter.    *If you need refills on other medications prior to your next appointment, please contact your  pharmacy*  Follow-Up: Call back or seek an in-person evaluation if the symptoms worsen or if the condition fails to improve as anticipated.  Other Instructions   If you have been instructed to have an in-person evaluation today at a local Urgent Care facility, please use the link below. It will take you to a list of all of our available [facility], including address, phone number and hours of operation. Please do not delay care.  [facility]  If you or a family member do not have a primary care provider, use the link below to schedule a visit and establish care. When you choose a [facility] primary care physician or advanced practice provider, you gain a long-term partner in health. Find a Primary Care Provider  Learn more about [facility]'s in-office and virtual care options: [facility] - Get Care Now

## 2021-11-04 NOTE — Progress Notes (Signed)
Virtual Visit Consent   Melissa Martinez, you are scheduled for a virtual visit with a [facility] provider today.     Just as with appointments in the office, your consent must be obtained to participate.  Your consent will be active for this visit and any virtual visit you may have with one of our providers in the next 365 days.     If you have a MyChart account, a copy of this consent can be sent to you electronically.  All virtual visits are billed to your insurance company just like a traditional visit in the office.    As this is a virtual visit, video technology does not allow for your provider to perform a traditional examination.  This may limit your provider's ability to fully assess your condition.  If your provider identifies any concerns that need to be evaluated in person or the need to arrange testing (such as labs, EKG, etc.), we will make arrangements to do so.     Although advances in technology are sophisticated, we cannot ensure that it will always work on either your end or our end.  If the connection with a video visit is poor, the visit may have to be switched to a telephone visit.  With either a video or telephone visit, we are not always able to ensure that we have a secure connection.     I need to obtain your verbal consent now.   Are you willing to proceed with your visit today?    Melissa Martinez has provided verbal consent on 11/04/2021 for a virtual visit (video or telephone).   Melissa Martinez, [place]   Date: 11/04/2021 1:14 PM   Virtual Visit via Video Note   I, Melissa Martinez, connected with  Melissa Martinez  ([id number], 16-May-1986) on 11/04/21 at  1:00 PM EST by a video-enabled telemedicine application and verified that I am speaking with the correct person using two identifiers.  Location: Patient: Virtual Visit Location Patient: Home Provider: Virtual Visit Location Provider: Home Office   I discussed the limitations of evaluation and management by  telemedicine and the availability of in person appointments. The patient expressed understanding and agreed to proceed.    History of Present Illness: Melissa Martinez is a 35 y.o. who identifies as a female who was assigned female at birth, and is being seen today for discussion of possible other medications to help with symptoms from COVID 19. Patient was evaluated at local UC yesterday and diagnosed with COVID-19. She was started on Paxlovid, Meclizine and Zofran. Notes taking medications as directed. No further nausea or vomiting since last night. Biggest issue now is the persistent cough that is occurring in frequent spells. Is making her chest wall tender and worsening headache. Is alternating Tylenol and Ibuprofen as well. Denies any true SOB or chest tightness/wheezing and as such has not been using her inhalers.  HPI: HPI  Problems:  Patient Active Problem List   Diagnosis Date Noted   Allergy to almonds 08/23/2018   Vaccine reaction, initial encounter 08/23/2018   Allergic rhinitis 08/23/2018   Chronic neck pain 04/16/2018   Headache disorder 04/16/2018   Asymptomatic hyperuricemia 02/05/2018   Chronic pain of left heel 02/05/2018   Screening for cervical cancer 08/01/2017   Nausea 07/03/2017   Dehydration 06/12/2017   Adenitis 05/23/2017   Adenitis 05/23/2017   Closed Colles' fracture of left radius 10/17/2016   Kidney stones 12/14/2015   PCOS (polycystic ovarian  syndrome) 12/14/2015   Upper airway cough syndrome 08/17/2015   Upper airway cough syndrome 08/17/2015   Abdominal pain, chronic, generalized 11/11/2014   Bleeding per rectum 11/11/2014   Change in bowel habits 11/11/2014   Fatty infiltration of liver 11/11/2014   Bright red rectal bleeding 11/11/2014    Allergies:  Allergies  Allergen Reactions   Levofloxacin Anaphylaxis, Itching and Other (See Comments)    Throat tightening, generalized itching, redness   Reglan [Metoclopramide] Anxiety    Causes paranoia     Other Other (See Comments)    splenda and equal- itching, hives   Almond (Diagnostic) Hives   Bactrim [Sulfamethoxazole-Trimethoprim] Itching    Also redness   Equal [Aspartame] Itching   Sulfa Antibiotics Other (See Comments)    Redness, hives, itching , skin felt on fire   Tape Hives   Medications:  Current Outpatient Medications:    promethazine-dextromethorphan (PROMETHAZINE-DM) 6.25-15 MG/5ML syrup, Take 5 mLs by mouth 4 (four) times daily as needed for cough., Disp: 118 mL, Rfl: 0   albuterol (VENTOLIN HFA) 108 (90 Base) MCG/ACT inhaler, Inhale 2 puffs into the lungs every 6 (six) hours as needed for wheezing or shortness of breath., Disp: 8 g, Rfl: 0   ascorbic acid (VITAMIN C) 500 MG tablet, Take by mouth., Disp: , Rfl:    budesonide (PULMICORT) 1 MG/2ML nebulizer solution, USE 1 VIAL VIA NEBULIZER TWICE A DAY AS DIRECTED, Disp: 120 mL, Rfl: 6   busPIRone (BUSPAR) 5 MG tablet, Take 1 tablet by mouth 2 (two) times daily., Disp: , Rfl:    busPIRone (BUSPAR) 5 MG tablet, Take 1 tablet (5 mg total) by mouth 2 (two) times daily, Disp: 60 tablet, Rfl: 11   Cholecalciferol 50 MCG (2000 UT) CAPS, Take by mouth., Disp: , Rfl:    drospirenone-ethinyl estradiol (JASMIEL) 3-0.02 MG tablet, Take 1 tablet by mouth daily., Disp: 84 tablet, Rfl: 1   EPINEPHrine 0.3 mg/0.3 mL IJ SOAJ injection, INJECT INTRAMUSCULARLY IN THE EVENT OF AN EMERGENT ALLERGIC REACTION WITH DIFFICULTY BREATHING., Disp: , Rfl:    FLUoxetine (PROZAC) 10 MG tablet, TAKE 3 TABLETS (30MG ) BY MOUTH DAILY, Disp: 270 tablet, Rfl: 2   FLUoxetine (PROZAC) 10 MG tablet, Take 3 tablets (30 mg total) by mouth daily., Disp: 270 tablet, Rfl: 2   fluticasone (FLOVENT HFA) 110 MCG/ACT inhaler, Inhale 2 puffs into the lungs in the morning and at bedtime., Disp: 12 g, Rfl: 0   hydrochlorothiazide (HYDRODIURIL) 25 MG tablet, TAKE 1 TABLET BY MOUTH ONCE DAILY (2-3 DAYS AS DIRECTED), Disp: 30 tablet, Rfl: 11   Insulin Pen Needle (UNIFINE  PENTIPS) 31G X 5 MM MISC, Use as directed with Saxenda, Disp: 100 each, Rfl: 0   Insulin Pen Needle (UNIFINE PENTIPS) 31G X 8 MM MISC, Use as directed with Saxenda., Disp: 100 each, Rfl: 0   Insulin Pen Needle 31G X 5 MM MISC, Use as directed, Disp: 100 each, Rfl: 12   KRILL OIL PO, Take 1 capsule by mouth daily. , Disp: , Rfl:    Liraglutide -Weight Management (SAXENDA) 18 MG/3ML SOPN, Inject 3 mg subcutaneously once daily, Disp: 15 mL, Rfl: 11   Liraglutide -Weight Management (SAXENDA) 18 MG/3ML SOPN, Inject 3 mg subcutaneously once daily, Disp: 15 mL, Rfl: 11   losartan (COZAAR) 50 MG tablet, Take 1 tablet (50 mg total) by mouth once daily, Disp: 30 tablet, Rfl: 11   metFORMIN (GLUCOPHAGE-XR) 500 MG 24 hr tablet, Take 1 tablet by mouth 2 (two) times daily.,  Disp: , Rfl:    Multiple Vitamins-Minerals (WOMENS MULTIVITAMIN PO), Take by mouth., Disp: , Rfl:    phentermine (ADIPEX-P) 37.5 MG tablet, TAKE 1/2 TO 1 TABLET BY MOUTH DAILY EVERY MORNING BEFORE BREAKFAST, Disp: 30 tablet, Rfl: 5   phentermine (ADIPEX-P) 37.5 MG tablet, Take 1 tablet (37.5 mg total) by mouth every morning before breakfast, Disp: 30 tablet, Rfl: 5   Prenatal Multivit-Min-Fe-FA (PRENATAL, W/IRON & FA,) 27-0.8 MG TABS, Take 1 tablet by mouth daily., Disp: , Rfl:    Semaglutide-Weight Management 0.25 MG/0.5ML SOAJ, Inject 0.25 mg into the skin once a week., Disp: 2 mL, Rfl: 1   SODIUM CHLORIDE, EXTERNAL, (SALINE WOUND WASH) 0.9 % SOLN, Apply topically 2 (two) times daily., Disp: , Rfl:    zinc gluconate 50 MG tablet, Take by mouth., Disp: , Rfl:   Observations/Objective: Patient is well-developed, well-nourished in no acute distress.  Resting comfortably at home.  Head is normocephalic, atraumatic.  No labored breathing. Speech is clear and coherent with logical content.  Patient is alert and oriented at baseline.   Assessment and Plan: 1. COVID-19 - promethazine-dextromethorphan (PROMETHAZINE-DM) 6.25-15 MG/5ML syrup;  Take 5 mLs by mouth 4 (four) times daily as needed for cough.  Dispense: 118 mL; Refill: 0 - MyChart COVID-19 home monitoring program; Future  She is to continue care as directed by [facility] provider yesterday including Paxlovid. No further need for zofran at present time. Supportive measures, OTC medications reviewed. Rx promethazine-DM cough syrup to use as directed. She is to restart her albuterol. Strict ER precautions reviewed.   Follow Up Instructions: I discussed the assessment and treatment plan with the patient. The patient was provided an opportunity to ask questions and all were answered. The patient agreed with the plan and demonstrated an understanding of the instructions.  A copy of instructions were sent to the patient via MyChart unless otherwise noted below.    The patient was advised to call back or seek an in-person evaluation if the symptoms worsen or if the condition fails to improve as anticipated.  Time:  I spent 13 minutes with the patient via telehealth technology discussing the above problems/concerns.    Melissa Rio, PA-C

## 2021-11-08 DIAGNOSIS — R051 Acute cough: Secondary | ICD-10-CM | POA: Diagnosis not present

## 2021-11-08 DIAGNOSIS — R0981 Nasal congestion: Secondary | ICD-10-CM | POA: Diagnosis not present

## 2021-11-08 DIAGNOSIS — J0100 Acute maxillary sinusitis, unspecified: Secondary | ICD-10-CM | POA: Diagnosis not present

## 2021-11-12 ENCOUNTER — Telehealth: Payer: 59 | Admitting: Physician Assistant

## 2021-11-12 DIAGNOSIS — J019 Acute sinusitis, unspecified: Secondary | ICD-10-CM

## 2021-11-12 DIAGNOSIS — B9689 Other specified bacterial agents as the cause of diseases classified elsewhere: Secondary | ICD-10-CM

## 2021-11-12 NOTE — Progress Notes (Signed)
Patient already being treated for sinusitis after COVID-19 infection. Antibiotics are helping but still with issue with thick nasal secretions/congestion. Will have her continue Mucinex with increased fluid intake, humidifier in the bedroom saline rinses at bedtime and can also use eucalyptus essential oil in diffuser or in shower to help further thin out/break up congestion.

## 2021-11-12 NOTE — Progress Notes (Signed)
I have spent 5 minutes in review of e-visit questionnaire, review and updating patient chart, medical decision making and response to patient.   Kensey Luepke Cody Mixtli Reno, PA-C    

## 2021-11-16 ENCOUNTER — Other Ambulatory Visit: Payer: Self-pay

## 2021-11-16 DIAGNOSIS — J209 Acute bronchitis, unspecified: Secondary | ICD-10-CM | POA: Diagnosis not present

## 2021-11-16 DIAGNOSIS — U071 COVID-19: Secondary | ICD-10-CM | POA: Diagnosis not present

## 2021-11-16 MED ORDER — DOXYCYCLINE HYCLATE 100 MG PO TABS
ORAL_TABLET | ORAL | 0 refills | Status: DC
  Filled 2021-11-16: qty 20, 10d supply, fill #0

## 2021-11-16 MED ORDER — PREDNISONE 10 MG PO TABS
ORAL_TABLET | ORAL | 0 refills | Status: DC
  Filled 2021-11-16: qty 14, 8d supply, fill #0

## 2021-12-01 ENCOUNTER — Other Ambulatory Visit (HOSPITAL_COMMUNITY): Payer: Self-pay

## 2021-12-01 DIAGNOSIS — Z20828 Contact with and (suspected) exposure to other viral communicable diseases: Secondary | ICD-10-CM | POA: Diagnosis not present

## 2021-12-09 ENCOUNTER — Other Ambulatory Visit (HOSPITAL_COMMUNITY): Payer: Self-pay

## 2021-12-14 DIAGNOSIS — M25522 Pain in left elbow: Secondary | ICD-10-CM | POA: Diagnosis not present

## 2021-12-14 DIAGNOSIS — G8929 Other chronic pain: Secondary | ICD-10-CM | POA: Diagnosis not present

## 2021-12-14 DIAGNOSIS — M7712 Lateral epicondylitis, left elbow: Secondary | ICD-10-CM | POA: Diagnosis not present

## 2021-12-23 DIAGNOSIS — J0100 Acute maxillary sinusitis, unspecified: Secondary | ICD-10-CM | POA: Diagnosis not present

## 2022-01-17 ENCOUNTER — Other Ambulatory Visit (HOSPITAL_COMMUNITY): Payer: Self-pay

## 2022-01-19 DIAGNOSIS — J209 Acute bronchitis, unspecified: Secondary | ICD-10-CM | POA: Diagnosis not present

## 2022-01-19 DIAGNOSIS — J019 Acute sinusitis, unspecified: Secondary | ICD-10-CM | POA: Diagnosis not present

## 2022-01-19 DIAGNOSIS — R059 Cough, unspecified: Secondary | ICD-10-CM | POA: Diagnosis not present

## 2022-02-09 ENCOUNTER — Other Ambulatory Visit: Payer: Self-pay

## 2022-02-09 ENCOUNTER — Other Ambulatory Visit: Payer: Self-pay | Admitting: Obstetrics & Gynecology

## 2022-02-09 MED ORDER — DROSPIRENONE-ETHINYL ESTRADIOL 3-0.02 MG PO TABS
1.0000 | ORAL_TABLET | Freq: Every day | ORAL | 1 refills | Status: DC
  Filled 2022-02-09: qty 84, 84d supply, fill #0
  Filled 2022-06-15: qty 84, 84d supply, fill #1

## 2022-02-10 ENCOUNTER — Other Ambulatory Visit: Payer: Self-pay

## 2022-02-10 DIAGNOSIS — Z8249 Family history of ischemic heart disease and other diseases of the circulatory system: Secondary | ICD-10-CM | POA: Diagnosis not present

## 2022-02-10 DIAGNOSIS — Z131 Encounter for screening for diabetes mellitus: Secondary | ICD-10-CM | POA: Diagnosis not present

## 2022-02-10 DIAGNOSIS — M25522 Pain in left elbow: Secondary | ICD-10-CM | POA: Diagnosis not present

## 2022-02-10 DIAGNOSIS — R079 Chest pain, unspecified: Secondary | ICD-10-CM | POA: Diagnosis not present

## 2022-02-10 DIAGNOSIS — R002 Palpitations: Secondary | ICD-10-CM | POA: Diagnosis not present

## 2022-02-10 DIAGNOSIS — R7303 Prediabetes: Secondary | ICD-10-CM | POA: Diagnosis not present

## 2022-02-10 DIAGNOSIS — Z13220 Encounter for screening for lipoid disorders: Secondary | ICD-10-CM | POA: Diagnosis not present

## 2022-02-10 DIAGNOSIS — Z0000 Encounter for general adult medical examination without abnormal findings: Secondary | ICD-10-CM | POA: Diagnosis not present

## 2022-02-10 DIAGNOSIS — M7712 Lateral epicondylitis, left elbow: Secondary | ICD-10-CM | POA: Diagnosis not present

## 2022-02-10 DIAGNOSIS — R42 Dizziness and giddiness: Secondary | ICD-10-CM | POA: Diagnosis not present

## 2022-02-10 DIAGNOSIS — E669 Obesity, unspecified: Secondary | ICD-10-CM | POA: Diagnosis not present

## 2022-02-10 DIAGNOSIS — I10 Essential (primary) hypertension: Secondary | ICD-10-CM | POA: Diagnosis not present

## 2022-02-10 DIAGNOSIS — M62838 Other muscle spasm: Secondary | ICD-10-CM | POA: Diagnosis not present

## 2022-02-10 DIAGNOSIS — G8929 Other chronic pain: Secondary | ICD-10-CM | POA: Diagnosis not present

## 2022-02-10 DIAGNOSIS — E282 Polycystic ovarian syndrome: Secondary | ICD-10-CM | POA: Diagnosis not present

## 2022-02-10 MED ORDER — GABAPENTIN 100 MG PO CAPS
ORAL_CAPSULE | ORAL | 0 refills | Status: AC
  Filled 2022-02-10: qty 90, 30d supply, fill #0

## 2022-02-11 ENCOUNTER — Other Ambulatory Visit: Payer: Self-pay | Admitting: Student

## 2022-02-11 DIAGNOSIS — Z8249 Family history of ischemic heart disease and other diseases of the circulatory system: Secondary | ICD-10-CM

## 2022-02-21 ENCOUNTER — Other Ambulatory Visit: Payer: Self-pay

## 2022-02-21 DIAGNOSIS — J019 Acute sinusitis, unspecified: Secondary | ICD-10-CM | POA: Diagnosis not present

## 2022-02-21 DIAGNOSIS — N926 Irregular menstruation, unspecified: Secondary | ICD-10-CM | POA: Diagnosis not present

## 2022-02-21 DIAGNOSIS — I10 Essential (primary) hypertension: Secondary | ICD-10-CM | POA: Diagnosis not present

## 2022-02-21 MED ORDER — DOXYCYCLINE HYCLATE 100 MG PO TABS
100.0000 mg | ORAL_TABLET | Freq: Two times a day (BID) | ORAL | 0 refills | Status: DC
  Filled 2022-02-21: qty 14, 7d supply, fill #0

## 2022-02-21 MED ORDER — PREDNISONE 20 MG PO TABS
ORAL_TABLET | ORAL | 0 refills | Status: DC
  Filled 2022-02-21: qty 9, 6d supply, fill #0

## 2022-02-22 ENCOUNTER — Inpatient Hospital Stay: Admission: RE | Admit: 2022-02-22 | Payer: 59 | Source: Ambulatory Visit

## 2022-02-22 ENCOUNTER — Other Ambulatory Visit: Payer: Self-pay

## 2022-02-22 ENCOUNTER — Ambulatory Visit
Admission: RE | Admit: 2022-02-22 | Discharge: 2022-02-22 | Disposition: A | Discharge status: Home / Self Care | Payer: 59 | Source: Ambulatory Visit | Attending: Student | Admitting: Student

## 2022-02-22 DIAGNOSIS — Z8249 Family history of ischemic heart disease and other diseases of the circulatory system: Secondary | ICD-10-CM | POA: Insufficient documentation

## 2022-02-22 DIAGNOSIS — R079 Chest pain, unspecified: Secondary | ICD-10-CM | POA: Diagnosis not present

## 2022-02-22 DIAGNOSIS — R002 Palpitations: Secondary | ICD-10-CM | POA: Diagnosis not present

## 2022-03-01 ENCOUNTER — Other Ambulatory Visit: Payer: Self-pay

## 2022-03-01 MED ORDER — BUDESONIDE 1 MG/2ML IN SUSP
RESPIRATORY_TRACT | 6 refills | Status: DC
  Filled 2022-03-01: qty 60, 30d supply, fill #0

## 2022-03-02 ENCOUNTER — Other Ambulatory Visit: Payer: Self-pay

## 2022-03-10 ENCOUNTER — Other Ambulatory Visit: Payer: Self-pay

## 2022-03-10 MED ORDER — HYDROCORTISONE (PERIANAL) 2.5 % EX CREA
TOPICAL_CREAM | CUTANEOUS | 0 refills | Status: DC
  Filled 2022-03-10: qty 28, 10d supply, fill #0
  Filled 2022-05-08: qty 30, 10d supply, fill #0

## 2022-03-18 ENCOUNTER — Other Ambulatory Visit: Payer: Self-pay

## 2022-03-18 MED ORDER — LOSARTAN POTASSIUM 50 MG PO TABS
ORAL_TABLET | ORAL | 11 refills | Status: DC
  Filled 2022-03-18 – 2022-03-25 (×2): qty 60, 30d supply, fill #0
  Filled 2022-05-08: qty 60, 30d supply, fill #1
  Filled 2022-06-15: qty 60, 30d supply, fill #2

## 2022-03-23 ENCOUNTER — Other Ambulatory Visit (HOSPITAL_COMMUNITY): Payer: Self-pay

## 2022-03-23 DIAGNOSIS — J324 Chronic pansinusitis: Secondary | ICD-10-CM | POA: Diagnosis not present

## 2022-03-25 ENCOUNTER — Other Ambulatory Visit (HOSPITAL_COMMUNITY): Payer: Self-pay

## 2022-03-25 ENCOUNTER — Other Ambulatory Visit: Payer: Self-pay

## 2022-03-28 DIAGNOSIS — I480 Paroxysmal atrial fibrillation: Secondary | ICD-10-CM | POA: Diagnosis not present

## 2022-04-06 DIAGNOSIS — R002 Palpitations: Secondary | ICD-10-CM | POA: Diagnosis not present

## 2022-04-18 ENCOUNTER — Other Ambulatory Visit: Payer: Self-pay

## 2022-04-18 DIAGNOSIS — H9203 Otalgia, bilateral: Secondary | ICD-10-CM | POA: Diagnosis not present

## 2022-04-18 DIAGNOSIS — R110 Nausea: Secondary | ICD-10-CM | POA: Diagnosis not present

## 2022-04-18 DIAGNOSIS — J019 Acute sinusitis, unspecified: Secondary | ICD-10-CM | POA: Diagnosis not present

## 2022-04-18 MED ORDER — AMOXICILLIN 875 MG PO TABS
ORAL_TABLET | ORAL | 0 refills | Status: DC
  Filled 2022-04-18: qty 14, 7d supply, fill #0

## 2022-04-18 MED ORDER — ONDANSETRON 4 MG PO TBDP
ORAL_TABLET | ORAL | 0 refills | Status: AC
  Filled 2022-04-18: qty 20, 7d supply, fill #0

## 2022-04-18 MED ORDER — PREDNISONE 20 MG PO TABS
ORAL_TABLET | ORAL | 0 refills | Status: DC
  Filled 2022-04-18: qty 7, 7d supply, fill #0

## 2022-05-05 DIAGNOSIS — K644 Residual hemorrhoidal skin tags: Secondary | ICD-10-CM | POA: Diagnosis not present

## 2022-05-09 ENCOUNTER — Other Ambulatory Visit: Payer: Self-pay

## 2022-05-09 ENCOUNTER — Other Ambulatory Visit (HOSPITAL_COMMUNITY): Payer: Self-pay

## 2022-05-09 MED ORDER — DOXYCYCLINE HYCLATE 100 MG PO TABS
ORAL_TABLET | ORAL | 0 refills | Status: DC
  Filled 2022-05-09: qty 20, 10d supply, fill #0

## 2022-05-10 ENCOUNTER — Other Ambulatory Visit (HOSPITAL_COMMUNITY): Payer: Self-pay

## 2022-05-13 ENCOUNTER — Other Ambulatory Visit: Payer: Self-pay

## 2022-05-18 DIAGNOSIS — Z124 Encounter for screening for malignant neoplasm of cervix: Secondary | ICD-10-CM | POA: Diagnosis not present

## 2022-05-18 DIAGNOSIS — N898 Other specified noninflammatory disorders of vagina: Secondary | ICD-10-CM | POA: Diagnosis not present

## 2022-05-18 DIAGNOSIS — Z1151 Encounter for screening for human papillomavirus (HPV): Secondary | ICD-10-CM | POA: Diagnosis not present

## 2022-06-15 ENCOUNTER — Other Ambulatory Visit (HOSPITAL_COMMUNITY): Payer: Self-pay

## 2022-06-22 ENCOUNTER — Other Ambulatory Visit (HOSPITAL_COMMUNITY): Payer: Self-pay

## 2022-06-22 ENCOUNTER — Encounter (HOSPITAL_COMMUNITY): Payer: Self-pay | Admitting: Pharmacist

## 2022-06-24 ENCOUNTER — Other Ambulatory Visit (HOSPITAL_COMMUNITY): Payer: Self-pay

## 2022-07-07 DIAGNOSIS — E282 Polycystic ovarian syndrome: Secondary | ICD-10-CM | POA: Diagnosis not present

## 2022-07-08 DIAGNOSIS — J301 Allergic rhinitis due to pollen: Secondary | ICD-10-CM | POA: Diagnosis not present

## 2022-07-08 DIAGNOSIS — J320 Chronic maxillary sinusitis: Secondary | ICD-10-CM | POA: Diagnosis not present

## 2022-07-14 DIAGNOSIS — H93293 Other abnormal auditory perceptions, bilateral: Secondary | ICD-10-CM | POA: Diagnosis not present

## 2022-07-14 DIAGNOSIS — J320 Chronic maxillary sinusitis: Secondary | ICD-10-CM | POA: Diagnosis not present

## 2022-07-20 ENCOUNTER — Other Ambulatory Visit: Payer: Self-pay

## 2022-07-22 ENCOUNTER — Other Ambulatory Visit (HOSPITAL_COMMUNITY): Payer: Self-pay

## 2022-08-08 ENCOUNTER — Other Ambulatory Visit (HOSPITAL_COMMUNITY): Payer: Self-pay

## 2022-08-16 ENCOUNTER — Other Ambulatory Visit: Payer: Self-pay

## 2022-08-16 MED ORDER — INSULIN PEN NEEDLE 31G X 5 MM MISC
0 refills | Status: DC
  Filled 2022-08-16: qty 100, 90d supply, fill #0

## 2022-08-19 ENCOUNTER — Other Ambulatory Visit: Payer: Self-pay

## 2022-08-19 MED ORDER — GABAPENTIN 100 MG PO CAPS
100.0000 mg | ORAL_CAPSULE | Freq: Three times a day (TID) | ORAL | 0 refills | Status: DC
  Filled 2022-08-19: qty 90, 30d supply, fill #0

## 2022-09-08 ENCOUNTER — Other Ambulatory Visit: Payer: Self-pay

## 2022-09-29 ENCOUNTER — Other Ambulatory Visit: Payer: Self-pay

## 2022-10-10 ENCOUNTER — Other Ambulatory Visit (HOSPITAL_BASED_OUTPATIENT_CLINIC_OR_DEPARTMENT_OTHER): Payer: Self-pay

## 2022-10-10 ENCOUNTER — Other Ambulatory Visit: Payer: Self-pay

## 2022-10-10 MED ORDER — METHOCARBAMOL 500 MG PO TABS
500.0000 mg | ORAL_TABLET | Freq: Two times a day (BID) | ORAL | 0 refills | Status: AC | PRN
  Filled 2022-10-10: qty 20, 10d supply, fill #0

## 2022-10-14 ENCOUNTER — Other Ambulatory Visit: Payer: Self-pay

## 2022-10-17 ENCOUNTER — Other Ambulatory Visit: Payer: Self-pay | Admitting: Sports Medicine

## 2022-10-17 DIAGNOSIS — M62838 Other muscle spasm: Secondary | ICD-10-CM

## 2022-10-17 DIAGNOSIS — M7712 Lateral epicondylitis, left elbow: Secondary | ICD-10-CM

## 2022-10-27 ENCOUNTER — Other Ambulatory Visit: Payer: Self-pay | Admitting: Sports Medicine

## 2022-10-27 DIAGNOSIS — M7712 Lateral epicondylitis, left elbow: Secondary | ICD-10-CM

## 2022-10-27 DIAGNOSIS — G8929 Other chronic pain: Secondary | ICD-10-CM

## 2022-10-27 DIAGNOSIS — M62838 Other muscle spasm: Secondary | ICD-10-CM

## 2022-11-17 ENCOUNTER — Other Ambulatory Visit: Payer: Self-pay

## 2022-12-05 ENCOUNTER — Other Ambulatory Visit: Payer: Self-pay

## 2022-12-05 ENCOUNTER — Encounter: Payer: Self-pay | Admitting: Emergency Medicine

## 2022-12-05 ENCOUNTER — Emergency Department
Admission: EM | Admit: 2022-12-05 | Discharge: 2022-12-05 | Disposition: A | Discharge status: Home / Self Care | Payer: BC Managed Care – PPO | Attending: Emergency Medicine | Admitting: Emergency Medicine

## 2022-12-05 ENCOUNTER — Emergency Department: Payer: BC Managed Care – PPO

## 2022-12-05 ENCOUNTER — Other Ambulatory Visit: Payer: Self-pay | Admitting: Obstetrics and Gynecology

## 2022-12-05 DIAGNOSIS — O3680X0 Pregnancy with inconclusive fetal viability, not applicable or unspecified: Secondary | ICD-10-CM

## 2022-12-05 DIAGNOSIS — O00101 Right tubal pregnancy without intrauterine pregnancy: Secondary | ICD-10-CM | POA: Insufficient documentation

## 2022-12-05 DIAGNOSIS — O209 Hemorrhage in early pregnancy, unspecified: Secondary | ICD-10-CM

## 2022-12-05 DIAGNOSIS — O26891 Other specified pregnancy related conditions, first trimester: Secondary | ICD-10-CM | POA: Diagnosis not present

## 2022-12-05 DIAGNOSIS — R1031 Right lower quadrant pain: Secondary | ICD-10-CM | POA: Diagnosis not present

## 2022-12-05 DIAGNOSIS — O4691 Antepartum hemorrhage, unspecified, first trimester: Secondary | ICD-10-CM | POA: Insufficient documentation

## 2022-12-05 DIAGNOSIS — O009 Unspecified ectopic pregnancy without intrauterine pregnancy: Secondary | ICD-10-CM | POA: Clinically undetermined

## 2022-12-05 LAB — URINALYSIS, ROUTINE W REFLEX MICROSCOPIC
Bilirubin Urine: NEGATIVE
Glucose, UA: NEGATIVE mg/dL
Hgb urine dipstick: NEGATIVE
Ketones, ur: NEGATIVE mg/dL
Leukocytes,Ua: NEGATIVE
Nitrite: NEGATIVE
Protein, ur: NEGATIVE mg/dL
Specific Gravity, Urine: 1.028 (ref 1.005–1.030)
pH: 5 (ref 5.0–8.0)

## 2022-12-05 LAB — CBC WITH DIFFERENTIAL/PLATELET
Abs Immature Granulocytes: 0.06 10*3/uL (ref 0.00–0.07)
Basophils Absolute: 0.1 10*3/uL (ref 0.0–0.1)
Basophils Relative: 1 %
Eosinophils Absolute: 0.3 10*3/uL (ref 0.0–0.5)
Eosinophils Relative: 3 %
HCT: 33.2 % — ABNORMAL LOW (ref 36.0–46.0)
Hemoglobin: 10.6 g/dL — ABNORMAL LOW (ref 12.0–15.0)
Immature Granulocytes: 1 %
Lymphocytes Relative: 30 %
Lymphs Abs: 3 10*3/uL (ref 0.7–4.0)
MCH: 27.5 pg (ref 26.0–34.0)
MCHC: 31.9 g/dL (ref 30.0–36.0)
MCV: 86 fL (ref 80.0–100.0)
Monocytes Absolute: 0.7 10*3/uL (ref 0.1–1.0)
Monocytes Relative: 7 %
Neutro Abs: 5.9 10*3/uL (ref 1.7–7.7)
Neutrophils Relative %: 58 %
Platelets: 391 10*3/uL (ref 150–400)
RBC: 3.86 MIL/uL — ABNORMAL LOW (ref 3.87–5.11)
RDW: 13.9 % (ref 11.5–15.5)
WBC: 10 10*3/uL (ref 4.0–10.5)
nRBC: 0 % (ref 0.0–0.2)

## 2022-12-05 LAB — POC URINE PREG, ED: Preg Test, Ur: POSITIVE — AB

## 2022-12-05 LAB — COMPREHENSIVE METABOLIC PANEL
ALT: 25 U/L (ref 0–44)
AST: 28 U/L (ref 15–41)
Albumin: 3.8 g/dL (ref 3.5–5.0)
Alkaline Phosphatase: 82 U/L (ref 38–126)
Anion gap: 11 (ref 5–15)
BUN: 8 mg/dL (ref 6–20)
CO2: 21 mmol/L — ABNORMAL LOW (ref 22–32)
Calcium: 9.1 mg/dL (ref 8.9–10.3)
Chloride: 104 mmol/L (ref 98–111)
Creatinine, Ser: 0.58 mg/dL (ref 0.44–1.00)
GFR, Estimated: >60 mL/min (ref >60)
Glucose, Bld: 106 mg/dL — ABNORMAL HIGH (ref 70–99)
Potassium: 3.7 mmol/L (ref 3.5–5.1)
Sodium: 136 mmol/L (ref 135–145)
Total Bilirubin: 0.5 mg/dL (ref 0.3–1.2)
Total Protein: 7.8 g/dL (ref 6.5–8.1)

## 2022-12-05 LAB — HCG, QUANTITATIVE, PREGNANCY: hCG, Beta Chain, Quant, S: 1963 m[IU]/mL — ABNORMAL HIGH (ref <5)

## 2022-12-05 LAB — ABO/RH: ABO/RH(D): O NEG

## 2022-12-05 LAB — ANTIBODY SCREEN: Antibody Screen: NEGATIVE

## 2022-12-05 MED ORDER — HYDROCODONE-ACETAMINOPHEN 5-325 MG PO TABS: 1.0000 | ORAL_TABLET | Freq: Four times a day (QID) | ORAL | 0 refills | Status: AC | PRN

## 2022-12-05 MED ORDER — RHO D IMMUNE GLOBULIN 1500 UNIT/2ML IJ SOSY
300.0000 ug | PREFILLED_SYRINGE | Freq: Once | INTRAMUSCULAR | Status: AC
  Administered 2022-12-05: 300 ug via INTRAMUSCULAR
  Filled 2022-12-05: qty 2

## 2022-12-05 MED ORDER — OXYCODONE-ACETAMINOPHEN 5-325 MG PO TABS
2.0000 | ORAL_TABLET | Freq: Once | ORAL | Status: AC
  Administered 2022-12-05: 2 via ORAL
  Filled 2022-12-05: qty 2

## 2022-12-05 MED ORDER — METHOTREXATE SODIUM CHEMO INJECTION 250 MG/10ML
50.0000 mg/m2 | Freq: Once | INTRAMUSCULAR | Status: AC
  Administered 2022-12-05: 115 mg via INTRAMUSCULAR
  Filled 2022-12-05: qty 4.6

## 2022-12-05 NOTE — ED Triage Notes (Signed)
RLQ/pelvic pain x 1 week. Had vaginal bleeding, bright red, on Tuesday. Denies any current vaginal bleeding. Denies nausea/vomiting. Denies flank pain, denies feeling as if it is " bladder contractions." Hx of ectopic.

## 2022-12-05 NOTE — ED Provider Notes (Addendum)
[facility] Provider Note    Event Date/Time   First MD Initiated Contact with Patient 12/05/22 [phone]     (approximate)   History   Abdominal Pain   HPI  Melissa Martinez is a 37 y.o. female G4P1 with LMP 10/18/22 who presents with right lower abdominal and pelvic pain for the last several days, persistent course, crampy in quality, and associated with vaginal spotting previously, which has subsided today.  The patient had a prior ectopic in the right ovary that was treated with methotrexate.  She denies any nausea or vomiting, fever or chills, diarrhea, or urinary symptoms.  I reviewed the past medical records.  The patient was seen by Dr. Leafy Ro from OB/GYN on 1/15 for a positive pregnancy test with bright red spotting.  Her hCG was 759 on 1/15 and 971 on 1/17.   Physical Exam   Triage Vital Signs: ED Triage Vitals  Enc Vitals Group     BP 12/05/22 0054 (!) 150/91     Pulse Rate 12/05/22 0054 91     Resp 12/05/22 0054 20     Temp 12/05/22 0054 98.4 F (36.9 C)     Temp Source 12/05/22 0054 Oral     SpO2 12/05/22 0054 100 %     Weight 12/05/22 0057 240 lb (108.9 kg)     Height 12/05/22 0057 5\' 8"  (1.727 m)     Head Circumference --      Peak Flow --      Pain Score 12/05/22 0057 10     Pain Loc --      Pain Edu? --      Excl. in [place]? --     Most recent vital signs: Vitals:   12/05/22 0054 12/05/22 0541  BP: (!) 150/91 134/74  Pulse: 91 87  Resp: 20 18  Temp: 98.4 F (36.9 C) 98 F (36.7 C)  SpO2: 100% 100%    General: Awake, uncomfortable appearing but in no distress.  CV:  Good peripheral perfusion.  Resp:  Normal effort.  Abd:  Soft and nontender.  No peritoneal signs.  No distention.  Other:  No jaundice or scleral icterus.   ED Results / Procedures / Treatments   Labs (all labs ordered are listed, but only abnormal results are displayed) Labs Reviewed  CBC WITH DIFFERENTIAL/PLATELET - Abnormal; Notable for the following  components:      Result Value   RBC 3.86 (*)    Hemoglobin 10.6 (*)    HCT 33.2 (*)    All other components within normal limits  COMPREHENSIVE METABOLIC PANEL - Abnormal; Notable for the following components:   CO2 21 (*)    Glucose, Bld 106 (*)    All other components within normal limits  URINALYSIS, ROUTINE W REFLEX MICROSCOPIC - Abnormal; Notable for the following components:   Color, Urine YELLOW (*)    APPearance HAZY (*)    All other components within normal limits  HCG, QUANTITATIVE, PREGNANCY - Abnormal; Notable for the following components:   hCG, Beta Chain, Quant, S 1,963 (*)    All other components within normal limits  POC URINE PREG, ED - Abnormal; Notable for the following components:   Preg Test, Ur POSITIVE (*)    All other components within normal limits     EKG     RADIOLOGY  US pelvis: I independently viewed and interpreted the images; there is no visible IUP.  Radiology report indicates thickening adjacent to the  right ovary.  Ectopic cannot be ruled out.   PROCEDURES:  Critical Care performed: No  Procedures   MEDICATIONS ORDERED IN ED: Medications  methotrexate (for ectopic pregnancy) 25 mg/mL chemo injection (has no administration in time range)  oxyCODONE-acetaminophen (PERCOCET/ROXICET) 5-325 MG per tablet 2 tablet (2 tablets Oral Given 12/05/22 4008)     IMPRESSION / MDM / ASSESSMENT AND PLAN / ED COURSE  I reviewed the triage vital signs and the nursing notes.  37 year old female with PMH as noted above presents with right lower abdominal pain in the context of early pregnancy.  She had some spotting previously which has resolved.  On exam she does not have significant tenderness to palpation.  Differential diagnosis includes, but is not limited to, pregnancy of unknown location, early IUP, threatened miscarriage, ectopic.  We will obtain lab workup, ultrasound, and reassess.  Patient's presentation is most consistent with acute  presentation with potential threat to life or bodily function.  The patient is on the cardiac monitor to evaluate for evidence of arrhythmia and/or significant heart rate changes.  ----------------------------------------- 4:26 AM on 12/05/2022 -----------------------------------------  Lab workup is overall unremarkable.  hCG is 1900 which is not the expected rise from her most recent hCG reading.  Urinalysis shows no acute findings.  CBC shows mild anemia.  Ultrasound shows no IUP; there is thickening adjacent to the right ovary and ectopic cannot be ruled out.  I consulted with the CNM from [place] who come to evaluate the patient.  ----------------------------------------- 6:42 AM on 12/05/2022 -----------------------------------------  Dr. Glennon Mac from [facility] evaluated the patient and I discussed the case with him.  He advises that after discussion with the patient she would prefer treatment with methotrexate rather than attempting surgery.  The patient is hemodynamically stable.  She is still having some pain but does not show findings of ruptured ectopic or other acute complication.  She feels comfortable going home.  Dr. Glennon Mac recommends discharge with close follow-up.  The patient will return for repeat labs in several days and then follow-up with [facility].  He counseled the patient further and gave her return precautions and follow-up instructions.  FINAL CLINICAL IMPRESSION(S) / ED DIAGNOSES   Final diagnoses:  Pregnancy of unknown anatomic location     Rx / DC Orders   ED Discharge Orders          Ordered    HYDROcodone-acetaminophen (NORCO/VICODIN) 5-325 MG tablet  Every 6 hours PRN        12/05/22 6761             Note:  This document was prepared using Dragon voice recognition software and may include unintentional dictation errors.    Arta Silence, MD 12/05/22 9509    Arta Silence, MD 12/05/22 [phone]

## 2022-12-05 NOTE — Consult Note (Signed)
GYNECOLOGY CONSULT NOTE  GYN Consultation  Attending Provider: Arta Silence, MD   Melissa Martinez [id number] 12/05/2022 6:57 AM    Reason for Consultation:   Melissa Martinez is a 37 y.o. [id number] premenopausal female seen at the request of Dr. Arta Silence, MD, for evaluation of right lower quadrant pain in the setting of pregnancy.    History of Present Ilness:   The patient's last menstrual period occurred on 10/18/2022.  Since that time she has had some light vaginal bleeding. About a week ago she noted vaginal bleeding. She was evaluated in the clinic.   Her hCG levels are as follows: 11/28/2022: 759 11/30/2022: 972 12/05/2022: 1,963  She has a history of right lower quadrant pain that has been previously assessed.  Imaging did not have any positive findings. She states that she lives with a low-level of pain on that side that is more like a pressure. The pain does get worse at times and at worst the pain is 5/10, which is her level currently.  Over the weekend she has had pain.  Last night the pain got worse and was 8/10.  This pain was different and did take her breath away.  Nothing seemed to make the pain better or worse.  The pain did not radiate.  She has had very little vaginal bleeding.  Without medication, the pain has decreased to 5/10.  She has a history of suspected ectopic pregnancy.  Based on a review of the ultrasound report from 06/2020, there was a suspicious left adnexal structure. She did have a sac noted within the uterus at the same time.  She was treated with methotrexate at this time and this resolved her pregnancy that was not progressing normally anyway.  She denies nausea, vomiting, fever, chills, and other systemic symptoms.   She also recently completed a course of Augmentin for bronchitis.  She states the symptoms are mostly upper respiratory, but do not seem to be improving.  She has ben using albuterol for this, as well.    Past Medical History:  Diagnosis  Date   Abdominal pain, generalized 11/11/2014   Altered bowel function 11/11/2014   Anxiety    tkes Prozac, for OCD related anxiety   Bleeding per rectum 11/11/2014   Calculus of kidney 1/47/8295   Complication of anesthesia    sensitive / stops breathing   Fatty infiltration of liver 11/11/2014   Hypertension    Occipital neuralgia    PCOS (polycystic ovarian syndrome)    PONV (postoperative nausea and vomiting)    Upper airway cough syndrome 08/17/2015   Followed in Pulmonary clinic/ [facility]/ [place]     Past Surgical History:  Procedure Laterality Date   ACHILLES TENDON SURGERY Left 04/20/2018   Procedure: ACHILLES TENDON REPAIR-SECONDARY;  Surgeon: Samara Deist, DPM;  Location: [facility];  Service: Podiatry;  Laterality: Left;   BALLOON SINUPLASTY Left 07/17/2019   Procedure: BALLOON SINUPLASTY;  Surgeon: Carloyn Manner, MD;  Location: [place];  Service: ENT;  Laterality: Left;   CESAREAN SECTION N/A 06/22/2016   Procedure: CESAREAN SECTION;  Surgeon: Gae Dry, MD;  Location: [facility];  Service: Obstetrics;  Laterality: N/A;   CHOLECYSTECTOMY     COLONOSCOPY  09/2014   DILATION AND CURETTAGE OF UTERUS  2013   ENDOSCOPIC TURBINATE REDUCTION Bilateral 07/17/2019   Procedure: ENDOSCOPIC TURBINATE REDUCTION;  Surgeon: Carloyn Manner, MD;  Location: [place];  Service: ENT;  Laterality: Bilateral;  NEED STRYKER DISK GAVE DISK  TO BRENDA 8-20  KP rep coming Gwenyth Allegra   ESOPHAGOGASTRODUODENOSCOPY ENDOSCOPY     EYE SURGERY     lasik   hemmorhoidectomy  2012   HYSTEROSCOPY  2015   LAPAROSCOPIC CHOLECYSTECTOMY  06/2009   LASIK Bilateral    MAXILLARY ANTROSTOMY Right 07/17/2019   Procedure: MAXILLARY ANTROSTOMY;  Surgeon: Bud Face, MD;  Location: [facility];  Service: ENT;  Laterality: Right;   OSTECTOMY Left 04/20/2018   Procedure: OSTECTOMY-HAGLUNDS/RETROCALCANEAL;  Surgeon: Gwyneth Revels, DPM;  Location: [facility];  Service:  Podiatry;  Laterality: Left;   STENT PLACE LEFT URETER ([facility] HX) Left 11/2012   has had 2 stones pass stones   Allergies  Allergen Reactions   Levofloxacin Anaphylaxis, Itching and Other (See Comments)    Throat tightening, generalized itching, redness   Reglan [Metoclopramide] Anxiety    Causes paranoia    Other Other (See Comments)    splenda and equal- itching, hives   Almond (Diagnostic) Hives   Bactrim [Sulfamethoxazole-Trimethoprim] Itching    Also redness   Equal [Aspartame] Itching   Sulfa Antibiotics Other (See Comments)    Redness, hives, itching , skin felt on fire   Tape Hives   Prior to Admission medications   Medication Sig Start Date End Date Taking? Authorizing Provider  HYDROcodone-acetaminophen (NORCO/VICODIN) 5-325 MG tablet Take 1 tablet by mouth every 6 (six) hours as needed for up to 5 days for severe pain. 12/05/22 12/10/22 Yes Dionne Bucy, MD  albuterol (VENTOLIN HFA) 108 (90 Base) MCG/ACT inhaler Inhale 2 puffs into the lungs every 6 (six) hours as needed for wheezing or shortness of breath. 08/03/21   Viviano Simas, FNP  amoxicillin (AMOXIL) 875 MG tablet Take 1 tablet (875 mg total) by mouth every 12 (twelve) hours for 7 days 04/18/22     ascorbic acid (VITAMIN C) 500 MG tablet Take by mouth.    [provider]  budesonide (PULMICORT) 1 MG/2ML nebulizer solution USE 1 VIAL VIA NEBULIZER TWICE A DAY AS DIRECTED 03/01/22 03/01/23    busPIRone (BUSPAR) 5 MG tablet Take 1 tablet (5 mg total) by mouth 2 (two) times daily 08/17/21     Cholecalciferol 50 MCG (2000 UT) CAPS Take by mouth.    [provider]  doxycycline (VIBRA-TABS) 100 MG tablet Take 1 tablet (100 mg total) by mouth 2 (two) times daily for 10 days 11/16/21     doxycycline (VIBRA-TABS) 100 MG tablet Take 1 tablet (100 mg total) by mouth 2 (two) times daily for 10 days 05/09/22     drospirenone-ethinyl estradiol (JASMIEL) 3-0.02 MG tablet Take 1 tablet by mouth daily. 02/09/22   [name], MD  EPINEPHrine 0.3 mg/0.3 mL IJ SOAJ injection INJECT INTRAMUSCULARLY IN THE EVENT OF AN EMERGENT ALLERGIC REACTION WITH DIFFICULTY BREATHING. 08/22/18   [provider]  FLUoxetine (PROZAC) 10 MG tablet TAKE 3 TABLETS (30MG ) BY MOUTH DAILY 06/24/20 06/24/21  Whitaker, 08/24/21 Hestle, PA-C  FLUoxetine (PROZAC) 10 MG tablet Take 3 tablets (30 mg total) by mouth daily. 08/17/21     fluticasone (FLOVENT HFA) 110 MCG/ACT inhaler Inhale 2 puffs into the lungs in the morning and at bedtime. 08/03/21   08/05/21, FNP  gabapentin (NEURONTIN) 100 MG capsule Take 1 capsule by mouth 3 times a day as needed for arm pain for up to 30 days 02/10/22     gabapentin (NEURONTIN) 100 MG capsule Take 1 capsule (100 mg total) by mouth 3 (three) times daily as needed for arm  pain. 08/19/22   Jerrel Ivory, DO  hydrochlorothiazide (HYDRODIURIL) 25 MG tablet TAKE 1 TABLET BY MOUTH ONCE DAILY (2-3 DAYS AS DIRECTED) 09/08/20 09/08/21  Whitaker, Barbara Cower Hestle, PA-C  hydrocortisone (PROCTO-MED HC) 2.5 % rectal cream Apply topically 3 (three) times daily for 10 days 03/10/22     Insulin Pen Needle (UNIFINE PENTIPS) 31G X 5 MM MISC Use as directed with Saxenda 05/31/21   Whitaker, Barbara Cower Hestle, PA-C  Insulin Pen Needle (UNIFINE PENTIPS) 31G X 8 MM MISC Use as directed with Saxenda. 09/06/21     Insulin Pen Needle 31G X 5 MM MISC use as directed 08/16/22   Whitaker, Barbara Cower Hestle, PA-C  Insulin Pen Needle 31G X 5 MM MISC Use as directed 08/18/21     KRILL OIL PO Take 1 capsule by mouth daily.     [provider]  Liraglutide -Weight Management (SAXENDA) 18 MG/3ML SOPN Inject 3 mg subcutaneously once daily 07/05/21     Liraglutide -Weight Management (SAXENDA) 18 MG/3ML SOPN Inject 3 mg subcutaneously once daily 09/30/21     losartan (COZAAR) 50 MG tablet Take 2 tablets (100 mg total) by mouth once daily 03/17/22     metFORMIN (GLUCOPHAGE-XR) 500 MG 24 hr tablet Take 1 tablet by mouth 2 (two) times daily.  08/11/20   [provider]  methocarbamol (ROBAXIN) 500 MG tablet Take 1 tablet (500 mg total) by mouth 2 (two) times daily as needed for muscle spasm. 10/10/22     Multiple Vitamins-Minerals (WOMENS MULTIVITAMIN PO) Take by mouth.    [provider]  ondansetron (ZOFRAN-ODT) 4 MG disintegrating tablet Take 1 tablet (4 mg total) by mouth every 8 (eight) hours as needed 04/18/22     phentermine (ADIPEX-P) 37.5 MG tablet TAKE 1/2 TO 1 TABLET BY MOUTH DAILY EVERY MORNING BEFORE BREAKFAST 09/29/20 03/28/21  Sherlon Handing, MD  phentermine (ADIPEX-P) 37.5 MG tablet Take 1 tablet (37.5 mg total) by mouth every morning before breakfast 08/24/21     predniSONE (DELTASONE) 10 MG tablet Take 4 tablets (40 mg total) by mouth once daily for 2 days, THEN 2 tablets (20 mg total) once daily for 2 days, THEN 1 tablet (10 mg total) once daily for 2 days. 11/16/21     predniSONE (DELTASONE) 20 MG tablet Take 1 tablet (20 mg total) by mouth once daily for 7 days 04/18/22     Prenatal Multivit-Min-Fe-FA (PRENATAL, W/IRON & FA,) 27-0.8 MG TABS Take 1 tablet by mouth daily.    [provider]  promethazine-dextromethorphan (PROMETHAZINE-DM) 6.25-15 MG/5ML syrup Take 5 mLs by mouth 4 (four) times daily as needed for cough. 11/04/21   Waldon Merl, PA-C  Semaglutide-Weight Management 0.25 MG/0.5ML SOAJ Inject 0.25 mg into the skin once a week. 02/15/21     SODIUM CHLORIDE, EXTERNAL, (SALINE WOUND WASH) 0.9 % SOLN Apply topically 2 (two) times daily.    [provider]  zinc gluconate 50 MG tablet Take by mouth.    [provider]    Obstetric History: She is a [id number] female s/p c-section x 1.    Social History:  She  reports that she has never smoked. She has never used smokeless tobacco. She reports that she does not currently use alcohol. She reports that she does not use drugs.  Family History:  family history includes Bladder Cancer in her maternal grandfather; Breast  cancer in her maternal grandmother; Diabetes in her maternal grandmother; Heart disease in her father and maternal grandfather; Hyperlipidemia in  her father; Hypertension in her brother and mother; Kidney Stones in her mother.   Review of Systems  Constitutional: Negative.  Negative for chills and fever.  HENT: Negative.  Negative for congestion.   Eyes: Negative.   Respiratory:  Positive for cough. Negative for hemoptysis, sputum production, shortness of breath and wheezing.   Cardiovascular: Negative.   Gastrointestinal:  Positive for abdominal pain (see HPI). Negative for blood in stool, constipation, diarrhea, heartburn, melena, nausea and vomiting.  Genitourinary: Negative.   Musculoskeletal: Negative.   Skin: Negative.   Neurological: Negative.   Psychiatric/Behavioral: Negative.       Objective    BP 134/74   Pulse 87   Temp 98 F (36.7 C) (Oral)   Resp 18   Ht 5\' 8"  (1.727 m)   Wt 108.9 kg   SpO2 100%   BMI 36.49 kg/m  Physical Exam Constitutional:      General: She is not in acute distress.    Appearance: Normal appearance. She is well-developed.  Genitourinary:     Genitourinary Comments: deferred  HENT:     Head: Normocephalic and atraumatic.  Eyes:     General: No scleral icterus.    Conjunctiva/sclera: Conjunctivae normal.  Cardiovascular:     Rate and Rhythm: Normal rate and regular rhythm.     Heart sounds: No murmur heard.    No friction rub. No gallop.  Pulmonary:     Effort: Pulmonary effort is normal. No respiratory distress.     Breath sounds: Normal breath sounds. No wheezing or rales.     Comments: Some tubular breath sounds in the lower airways Abdominal:     General: Bowel sounds are normal. There is no distension.     Palpations: Abdomen is soft. There is no mass.     Tenderness: There is abdominal tenderness (mostly in RLQ, milder in LLQ (no rebound or guarding)). There is no guarding or rebound.  Musculoskeletal:        General: Normal  range of motion.     Cervical back: Normal range of motion and neck supple.  Neurological:     General: No focal deficit present.     Mental Status: She is alert and oriented to person, place, and time.     Cranial Nerves: No cranial nerve deficit.  Skin:    General: Skin is warm and dry.     Findings: No erythema.  Psychiatric:        Mood and Affect: Mood normal.        Behavior: Behavior normal.        Judgment: Judgment normal.      Laboratory Results:   Lab Results  Component Value Date   WBC 10.0 12/05/2022   RBC 3.86 (L) 12/05/2022   HGB 10.6 (L) 12/05/2022   HCT 33.2 (L) 12/05/2022   PLT 391 12/05/2022   NA 136 12/05/2022   K 3.7 12/05/2022   CREATININE 0.58 12/05/2022   Lab Results  Component Value Date   HCGBETAQNT 1,963 (H) 12/05/2022    Imaging Results 12/07/2022 OB LESS THAN 14 WEEKS W/ OB TRANSVAGINAL AND DOPPLER  Result Date: 12/05/2022 CLINICAL DATA:  Vaginal bleeding and first-trimester pregnancy. EXAM: OBSTETRIC <14 WK 12/07/2022 AND TRANSVAGINAL OB US DOPPLER ULTRASOUND OF OVARIES TECHNIQUE: Both transabdominal and transvaginal ultrasound examinations were performed for complete evaluation of the gestation as well as the maternal uterus, adnexal regions, and pelvic cul-de-sac. Transvaginal technique was performed to assess early pregnancy. Color and duplex Doppler ultrasound  was utilized to evaluate blood flow to the ovaries. COMPARISON:  07/13/2020 FINDINGS: No intrauterine gestational sac is seen but beta HCG of 1,900. Heterogeneous thickening at the right adnexa with an anterior masslike structure contiguous but not clearly emanating from the ovary. Normal appearance of the uterus and left ovary. Normal low resistance bilateral ovarian blood flow. Small volume simple pelvic fluid. These results were called by telephone at the time of interpretation on 12/05/2022 at 4:14 am to provider [facility] , who verbally acknowledged these results. IMPRESSION: 1. Pregnancy of unknown  location. Masslike thickening at the right adnexa which increase the chances of right-sided ectopic pregnancy. 2. Pelvic fluid which is small volume and simple. Electronically Signed   By: Jorje Guild M.D.   On: 12/05/2022 04:14      Assessment & Recommendations   Melissa Martinez is a 37 y.o. [id number] premenopausal female with high suspicion for right ectopic pregnancy being seen in consultation.  We had a long discussion regarding treatment options. We discussed treatment with methotrexate. She is otherwise a good candidate for methotrexate therapy. She is hemodynamically stable, her hcg level is well-below 5,000. There is no definite embryo seen. So, no cardiac activity noted.  The size of the mass in the an indeterminate zone at 3.7 cm.  There is some free peritoneal fluid, though this is small volume.  We discussed that with methotrexate therapy could still be the need for surgery.  If she notes an increase in pain, especially pain that does not subside or if vaginal bleeding increases, to let us know.  She could also fail MTX therapy and not have a drop in hCG levels that is sufficient.  We discussed surgery and the benefit of definitive diagnosis and treatment as a benefit.  The risks that go along with surgery would be the same as any surgery and she likely has a higher risk of bronchospasm compared to the general population with her current upper respiratory (?lower, as well) symptoms.  However, in a life-threatening situation, surgery is clearly preferred.  After deliberation, she would like to proceed with MTX therapy. I think this is reasonable overall. Her main concerning feature is pain. We discussed that severe pain has not been linked to treatment failure with methotrexate (small percentage failure).  This is possibly still higher overall than without the pain.  However, the absolute number is not high.  She voiced understanding and she would like to proceed with MTX, which I again think is  reasonable at this time.   1.  Follow up for hCG testing on 1/25 (day 4) and 1/28 (day 7). Orders placed for both lab draws through [facility]. Post-treatment instructions provided to patient.  2 Discussed with Dr. Cherylann Banas.    Prentice Docker, MD 12/05/2022 6:57 AM

## 2022-12-05 NOTE — Discharge Instructions (Addendum)
Follow up at the lab on Thursday and Sunday as discussed with Dr. Glennon Mac.    Return to the ER for new, worsening or persistent pain, bleeding, weakness or lightheadedness or any other new or worsening symptoms that concern you.    Methotrexate Information in Ectopic Pregnancy: POSTTREATMENT INSTRUCTIONS AND COUNSELING  ?Instructions - Patients should adhere to the following instructions during MTX treatment [16]: Avoid vaginal intercourse and new conception until human chorionic gonadotropin (hCG) is undetectable. Avoid pelvic examinations during surveillance of MTX therapy due to theoretical risk of tubal rupture. Avoid sun exposure to limit risk of MTX dermatitis. Avoid vitamins containing folic acid. It is common advice to avoid nonsteroidal antiinflammatory drugs (NSAIDs), as the interaction with MTX may decrease renal excretion of MTX and increase the risk of toxicity. However, for rheumatologic disease, low-dose MTX is sometimes given concurrently with NSAIDs along with close monitoring; the dose given for ectopic pregnancy is considered an intermediate dose. (See "Use of methotrexate in the treatment of rheumatoid arthritis", section on 'Pharmacology' and "Therapeutic use and toxicity of high-dose methotrexate", section on 'Potential drug-drug-interactions'.) ?Counseling about pain after treatment - Mild to moderate abdominal pain of short duration (one to two days) at six to seven days after receiving the MTX is common. The pain may be due to tubal abortion or tubal distention from hematoma formation and can usually be controlled with acetaminophen. By contrast, severe abdominal pain is concerning for tubal rupture. Such patients should be further evaluated with transvaginal ultrasonography; findings suggestive of hemoperitoneum raise clinical suspicion of tubal rupture. In one study, three parameters predicted hemoperitoneum ?300 mL in patients with ectopic pregnancy: moderate to severe  pelvic pain, fluid above the uterine fundus or around the ovary, and hemoglobin concentration <10 g/dL [29]. A patient with none of these three criteria had a probability of 5.3 percent of hemoperitoneum ?300 mL. When two or more criteria were present, the probability for hemoperitoneum ?300 mL reached 92.6 percent. If hemoperitoneum is not found on ultrasound, patients with severe pain should be closely observed for hemodynamic changes which may accompany tubal rupture. Falling hCG levels do not preclude the possibility of tubal rupture. If tubal rupture is suspected, immediate surgery is required. (See "Tubal ectopic pregnancy: Surgical treatment".) Severe pain alone in a hemodynamically stable patient is not an indication for surgery. As an example, in a review including 53 patients with ectopic pregnancy and managed with ectopic pregnancy, presenting with abdominal pain severe enough to be evaluated in the clinic or emergency department or requiring hospitalization, only eight patients subsequently required surgery [30].                          Following Methotrexate Administration for Ectopic Pregnancy  Your physician will obtain follow-up blood work to monitor the effect of the medication.   After receiving methotrexate avoid:  Alcoholic beverages  Vitamins containing folic acid Foods that contain folic acid, including fortified cereal, enriched bread and pasta, peanuts, dark green leafy vegetables, orange juice, and beans  Gas-forming foods  Nonsteroidal antiinflammatory painkillers  Sexual intercourse or any strenuous activity because it may cause the fallopian tube to rupture Do not become pregnant for 3 months to decrease the risk of birth defects.  You may experience side effects, like nausea, vomiting, dizziness, and mouth and lip ulcers.  Most women have abdominal pain a couple of days after the injection.  Notify your obstetric practitioner or return to the emergency department if  you develop severe abdominal pain, dizziness or fainting, heavy vaginal bleeding, severe nausea and vomiting, or fever.  Double-flush the toilet with the lid closed for 72 hours after receiving the injection.

## 2022-12-05 NOTE — ED Notes (Signed)
Pt resting in bed, NAD noted at this time. Denies any needs at this time. SO at bedside at this time.

## 2022-12-05 NOTE — ED Notes (Signed)
Pt to US via wheelchair.

## 2022-12-06 ENCOUNTER — Emergency Department: Payer: BC Managed Care – PPO | Admitting: Anesthesiology

## 2022-12-06 ENCOUNTER — Other Ambulatory Visit: Payer: Self-pay

## 2022-12-06 ENCOUNTER — Ambulatory Visit
Admission: EM | Admit: 2022-12-06 | Discharge: 2022-12-06 | Disposition: A | Discharge status: Home / Self Care | Payer: BC Managed Care – PPO | Attending: Emergency Medicine | Admitting: Emergency Medicine

## 2022-12-06 ENCOUNTER — Encounter
Admission: EM | Disposition: A | Discharge status: Home / Self Care | Payer: Self-pay | Source: Home / Self Care | Attending: Emergency Medicine

## 2022-12-06 ENCOUNTER — Encounter: Payer: Self-pay | Admitting: Emergency Medicine

## 2022-12-06 DIAGNOSIS — F419 Anxiety disorder, unspecified: Secondary | ICD-10-CM | POA: Insufficient documentation

## 2022-12-06 DIAGNOSIS — E282 Polycystic ovarian syndrome: Secondary | ICD-10-CM | POA: Diagnosis not present

## 2022-12-06 DIAGNOSIS — O00101 Right tubal pregnancy without intrauterine pregnancy: Secondary | ICD-10-CM | POA: Insufficient documentation

## 2022-12-06 DIAGNOSIS — O99341 Other mental disorders complicating pregnancy, first trimester: Secondary | ICD-10-CM | POA: Insufficient documentation

## 2022-12-06 DIAGNOSIS — R1031 Right lower quadrant pain: Secondary | ICD-10-CM

## 2022-12-06 DIAGNOSIS — O10911 Unspecified pre-existing hypertension complicating pregnancy, first trimester: Secondary | ICD-10-CM | POA: Insufficient documentation

## 2022-12-06 DIAGNOSIS — O009 Unspecified ectopic pregnancy without intrauterine pregnancy: Secondary | ICD-10-CM | POA: Diagnosis present

## 2022-12-06 DIAGNOSIS — N838 Other noninflammatory disorders of ovary, fallopian tube and broad ligament: Secondary | ICD-10-CM | POA: Insufficient documentation

## 2022-12-06 DIAGNOSIS — O26891 Other specified pregnancy related conditions, first trimester: Secondary | ICD-10-CM | POA: Insufficient documentation

## 2022-12-06 DIAGNOSIS — O3481 Maternal care for other abnormalities of pelvic organs, first trimester: Secondary | ICD-10-CM | POA: Diagnosis not present

## 2022-12-06 DIAGNOSIS — Z3A00 Weeks of gestation of pregnancy not specified: Secondary | ICD-10-CM | POA: Insufficient documentation

## 2022-12-06 DIAGNOSIS — K760 Fatty (change of) liver, not elsewhere classified: Secondary | ICD-10-CM | POA: Insufficient documentation

## 2022-12-06 DIAGNOSIS — F429 Obsessive-compulsive disorder, unspecified: Secondary | ICD-10-CM | POA: Diagnosis not present

## 2022-12-06 DIAGNOSIS — O26611 Liver and biliary tract disorders in pregnancy, first trimester: Secondary | ICD-10-CM | POA: Insufficient documentation

## 2022-12-06 HISTORY — PX: DIAGNOSTIC LAPAROSCOPY WITH REMOVAL OF ECTOPIC PREGNANCY: SHX6449

## 2022-12-06 LAB — BASIC METABOLIC PANEL
Anion gap: 11 (ref 5–15)
BUN: 7 mg/dL (ref 6–20)
CO2: 21 mmol/L — ABNORMAL LOW (ref 22–32)
Calcium: 9.4 mg/dL (ref 8.9–10.3)
Chloride: 103 mmol/L (ref 98–111)
Creatinine, Ser: 0.63 mg/dL (ref 0.44–1.00)
GFR, Estimated: >60 mL/min (ref >60)
Glucose, Bld: 129 mg/dL — ABNORMAL HIGH (ref 70–99)
Potassium: 4.1 mmol/L (ref 3.5–5.1)
Sodium: 135 mmol/L (ref 135–145)

## 2022-12-06 LAB — HCG, QUANTITATIVE, PREGNANCY: hCG, Beta Chain, Quant, S: 2301 m[IU]/mL — ABNORMAL HIGH (ref <5)

## 2022-12-06 LAB — RHOGAM INJECTION: Unit division: 0

## 2022-12-06 LAB — TYPE AND SCREEN
ABO/RH(D): O NEG
Antibody Screen: POSITIVE

## 2022-12-06 LAB — CBC
HCT: 35.7 % — ABNORMAL LOW (ref 36.0–46.0)
Hemoglobin: 11.5 g/dL — ABNORMAL LOW (ref 12.0–15.0)
MCH: 27.3 pg (ref 26.0–34.0)
MCHC: 32.2 g/dL (ref 30.0–36.0)
MCV: 84.8 fL (ref 80.0–100.0)
Platelets: 422 10*3/uL — ABNORMAL HIGH (ref 150–400)
RBC: 4.21 MIL/uL (ref 3.87–5.11)
RDW: 13.7 % (ref 11.5–15.5)
WBC: 14.2 10*3/uL — ABNORMAL HIGH (ref 4.0–10.5)
nRBC: 0 % (ref 0.0–0.2)

## 2022-12-06 SURGERY — LAPAROSCOPY, WITH ECTOPIC PREGNANCY SURGICAL TREATMENT
Anesthesia: General | Site: Abdomen | Laterality: Right

## 2022-12-06 MED ORDER — HYDROMORPHONE HCL 1 MG/ML IJ SOLN
0.5000 mg | Freq: Once | INTRAMUSCULAR | Status: AC
  Administered 2022-12-06: 0.5 mg via INTRAVENOUS
  Filled 2022-12-06: qty 0.5

## 2022-12-06 MED ORDER — DEXAMETHASONE SODIUM PHOSPHATE 10 MG/ML IJ SOLN
INTRAMUSCULAR | Status: DC | PRN
  Administered 2022-12-06: 10 mg via INTRAVENOUS

## 2022-12-06 MED ORDER — DROPERIDOL 2.5 MG/ML IJ SOLN
INTRAMUSCULAR | Status: AC
  Filled 2022-12-06: qty 2

## 2022-12-06 MED ORDER — MIDAZOLAM HCL 2 MG/2ML IJ SOLN
INTRAMUSCULAR | Status: AC
  Filled 2022-12-06: qty 2

## 2022-12-06 MED ORDER — ONDANSETRON HCL 4 MG/2ML IJ SOLN
INTRAMUSCULAR | Status: AC
  Filled 2022-12-06: qty 2

## 2022-12-06 MED ORDER — FENTANYL CITRATE (PF) 100 MCG/2ML IJ SOLN
INTRAMUSCULAR | Status: AC
  Filled 2022-12-06: qty 2

## 2022-12-06 MED ORDER — FENTANYL CITRATE (PF) 100 MCG/2ML IJ SOLN
25.0000 ug | INTRAMUSCULAR | Status: DC | PRN
  Administered 2022-12-06 (×4): 25 ug via INTRAVENOUS

## 2022-12-06 MED ORDER — PROPOFOL 500 MG/50ML IV EMUL
INTRAVENOUS | Status: DC | PRN
  Administered 2022-12-06: 150 ug/kg/min via INTRAVENOUS

## 2022-12-06 MED ORDER — LIDOCAINE HCL (PF) 2 % IJ SOLN
INTRAMUSCULAR | Status: AC
  Filled 2022-12-06: qty 5

## 2022-12-06 MED ORDER — LACTATED RINGERS IV SOLN: INTRAVENOUS | Status: DC

## 2022-12-06 MED ORDER — OXYCODONE HCL 5 MG PO TABS
ORAL_TABLET | ORAL | Status: AC
  Filled 2022-12-06: qty 1

## 2022-12-06 MED ORDER — KETOROLAC TROMETHAMINE 30 MG/ML IJ SOLN
INTRAMUSCULAR | Status: DC | PRN
  Administered 2022-12-06: 30 mg via INTRAVENOUS

## 2022-12-06 MED ORDER — SUGAMMADEX SODIUM 500 MG/5ML IV SOLN
INTRAVENOUS | Status: DC | PRN
  Administered 2022-12-06: 250 mg via INTRAVENOUS

## 2022-12-06 MED ORDER — PROPOFOL 10 MG/ML IV BOLUS
INTRAVENOUS | Status: AC
  Filled 2022-12-06: qty 20

## 2022-12-06 MED ORDER — GABAPENTIN 300 MG PO CAPS: 300.0000 mg | ORAL_CAPSULE | ORAL | Status: AC

## 2022-12-06 MED ORDER — OXYCODONE HCL 5 MG/5ML PO SOLN: 5.0000 mg | Freq: Once | ORAL | Status: AC | PRN

## 2022-12-06 MED ORDER — PROPOFOL 10 MG/ML IV BOLUS
INTRAVENOUS | Status: DC | PRN
  Administered 2022-12-06: 30 mg via INTRAVENOUS
  Administered 2022-12-06: 20 mg via INTRAVENOUS
  Administered 2022-12-06: 200 mg via INTRAVENOUS
  Administered 2022-12-06: 50 mg via INTRAVENOUS

## 2022-12-06 MED ORDER — LIDOCAINE HCL (CARDIAC) PF 100 MG/5ML IV SOSY
PREFILLED_SYRINGE | INTRAVENOUS | Status: DC | PRN
  Administered 2022-12-06: 100 mg via INTRAVENOUS

## 2022-12-06 MED ORDER — CHLORHEXIDINE GLUCONATE 0.12 % MT SOLN
OROMUCOSAL | Status: AC
  Administered 2022-12-06: 15 mL
  Filled 2022-12-06: qty 15

## 2022-12-06 MED ORDER — ACETAMINOPHEN 500 MG PO TABS
ORAL_TABLET | ORAL | Status: AC
  Administered 2022-12-06: 1000 mg via ORAL
  Filled 2022-12-06: qty 2

## 2022-12-06 MED ORDER — FENTANYL CITRATE (PF) 100 MCG/2ML IJ SOLN
INTRAMUSCULAR | Status: DC | PRN
  Administered 2022-12-06: 100 ug via INTRAVENOUS

## 2022-12-06 MED ORDER — ROCURONIUM BROMIDE 100 MG/10ML IV SOLN
INTRAVENOUS | Status: DC | PRN
  Administered 2022-12-06 (×2): 30 mg via INTRAVENOUS

## 2022-12-06 MED ORDER — DROPERIDOL 2.5 MG/ML IJ SOLN
0.6250 mg | Freq: Once | INTRAMUSCULAR | Status: AC
  Administered 2022-12-06: 0.625 mg via INTRAVENOUS

## 2022-12-06 MED ORDER — GABAPENTIN 300 MG PO CAPS
ORAL_CAPSULE | ORAL | Status: AC
  Administered 2022-12-06: 300 mg via ORAL
  Filled 2022-12-06: qty 1

## 2022-12-06 MED ORDER — DEXMEDETOMIDINE HCL IN NACL 80 MCG/20ML IV SOLN
INTRAVENOUS | Status: DC | PRN
  Administered 2022-12-06: 8 ug via BUCCAL

## 2022-12-06 MED ORDER — PROPOFOL 1000 MG/100ML IV EMUL
INTRAVENOUS | Status: AC
  Filled 2022-12-06: qty 100

## 2022-12-06 MED ORDER — OXYCODONE HCL 5 MG PO TABS
5.0000 mg | ORAL_TABLET | Freq: Once | ORAL | Status: AC | PRN
  Administered 2022-12-06: 5 mg via ORAL

## 2022-12-06 MED ORDER — BUPIVACAINE HCL (PF) 0.5 % IJ SOLN
INTRAMUSCULAR | Status: AC
  Filled 2022-12-06: qty 30

## 2022-12-06 MED ORDER — ACETAMINOPHEN 500 MG PO TABS: 1000.0000 mg | ORAL_TABLET | ORAL | Status: AC

## 2022-12-06 MED ORDER — SODIUM CHLORIDE 0.9 % IR SOLN
Status: DC | PRN
  Administered 2022-12-06: 1

## 2022-12-06 MED ORDER — SILVER NITRATE-POT NITRATE 75-25 % EX MISC
CUTANEOUS | Status: AC
  Filled 2022-12-06: qty 10

## 2022-12-06 MED ORDER — POVIDONE-IODINE 10 % EX SWAB
2.0000 | Freq: Once | CUTANEOUS | Status: DC
  Filled 2022-12-06: qty 2

## 2022-12-06 MED ORDER — ONDANSETRON HCL 4 MG/2ML IJ SOLN
4.0000 mg | Freq: Once | INTRAMUSCULAR | Status: AC
  Administered 2022-12-06: 4 mg via INTRAVENOUS
  Filled 2022-12-06: qty 2

## 2022-12-06 MED ORDER — BUPIVACAINE HCL 0.5 % IJ SOLN
INTRAMUSCULAR | Status: DC | PRN
  Administered 2022-12-06: 9 mL

## 2022-12-06 MED ORDER — ONDANSETRON HCL 4 MG/2ML IJ SOLN
INTRAMUSCULAR | Status: DC | PRN
  Administered 2022-12-06: 4 mg via INTRAVENOUS

## 2022-12-06 MED ORDER — MIDAZOLAM HCL 2 MG/2ML IJ SOLN
INTRAMUSCULAR | Status: DC | PRN
  Administered 2022-12-06: 2 mg via INTRAVENOUS

## 2022-12-06 MED ORDER — SUCCINYLCHOLINE CHLORIDE 200 MG/10ML IV SOSY
PREFILLED_SYRINGE | INTRAVENOUS | Status: DC | PRN
  Administered 2022-12-06: 120 mg via INTRAVENOUS

## 2022-12-06 MED ORDER — DEXAMETHASONE SODIUM PHOSPHATE 10 MG/ML IJ SOLN
INTRAMUSCULAR | Status: AC
  Filled 2022-12-06: qty 1

## 2022-12-06 SURGICAL SUPPLY — 34 items
BLADE SURG SZ11 CARB STEEL (BLADE) ×1 IMPLANT
CATH ROBINSON RED A/P 16FR (CATHETERS) ×1 IMPLANT
DRSG TEGADERM 2-3/8X2-3/4 SM (GAUZE/BANDAGES/DRESSINGS) ×3 IMPLANT
GAUZE 4X4 16PLY ~~LOC~~+RFID DBL (SPONGE) ×1 IMPLANT
GLOVE SURG SYN 8.0 (GLOVE) ×1 IMPLANT
GLOVE SURG SYN 8.0 PF PI (GLOVE) ×1 IMPLANT
GOWN STRL REUS W/ TWL LRG LVL3 (GOWN DISPOSABLE) ×2 IMPLANT
GOWN STRL REUS W/ TWL XL LVL3 (GOWN DISPOSABLE) ×1 IMPLANT
GOWN STRL REUS W/TWL LRG LVL3 (GOWN DISPOSABLE) ×4
GOWN STRL REUS W/TWL XL LVL3 (GOWN DISPOSABLE) ×2
GRASPER SUT TROCAR 14GX15 (MISCELLANEOUS) ×1 IMPLANT
IRRIGATION STRYKERFLOW (MISCELLANEOUS) IMPLANT
IRRIGATOR STRYKERFLOW (MISCELLANEOUS) ×1
IV NS 1000ML (IV SOLUTION) ×1
IV NS 1000ML BAXH (IV SOLUTION) ×1 IMPLANT
KIT TURNOVER CYSTO (KITS) ×1 IMPLANT
LABEL OR SOLS (LABEL) ×1 IMPLANT
MANIFOLD NEPTUNE II (INSTRUMENTS) ×1 IMPLANT
NS IRRIG 500ML POUR BTL (IV SOLUTION) ×1 IMPLANT
PACK GYN LAPAROSCOPIC (MISCELLANEOUS) ×1 IMPLANT
PAD OB MATERNITY 4.3X12.25 (PERSONAL CARE ITEMS) ×1 IMPLANT
PAD PREP 24X41 OB/GYN DISP (PERSONAL CARE ITEMS) ×1 IMPLANT
SCRUB CHG 4% DYNA-HEX 4OZ (MISCELLANEOUS) ×1 IMPLANT
SET TUBE SMOKE EVAC HIGH FLOW (TUBING) ×1 IMPLANT
SHEARS HARMONIC ACE PLUS 36CM (ENDOMECHANICALS) IMPLANT
SLEEVE Z-THREAD 5X100MM (TROCAR) ×1 IMPLANT
SPONGE GAUZE 2X2 8PLY STRL LF (GAUZE/BANDAGES/DRESSINGS) ×3 IMPLANT
SUT VIC AB 0 CT1 36 (SUTURE) ×1 IMPLANT
SUT VIC AB 2-0 UR6 27 (SUTURE) ×1 IMPLANT
SUT VIC AB 4-0 SH 27 (SUTURE) ×2
SUT VIC AB 4-0 SH 27XANBCTRL (SUTURE) ×1 IMPLANT
SYS BAG RETRIEVAL 10MM (BASKET) ×1
SYSTEM BAG RETRIEVAL 10MM (BASKET) IMPLANT
TROCAR XCEL NON-BLD 5MMX100MML (ENDOMECHANICALS) ×1 IMPLANT

## 2022-12-06 NOTE — ED Notes (Signed)
See triage note  Presents with increased abd pain   Was seen yesterday for same  Afebrile

## 2022-12-06 NOTE — Discharge Instructions (Signed)
AMBULATORY SURGERY  ?DISCHARGE INSTRUCTIONS ? ? ?The drugs that you were given will stay in your system until tomorrow so for the next 24 hours you should not: ? ?Drive an automobile ?Make any legal decisions ?Drink any alcoholic beverage ? ? ?You may resume regular meals tomorrow.  Today it is better to start with liquids and gradually work up to solid foods. ? ?You may eat anything you prefer, but it is better to start with liquids, then soup and crackers, and gradually work up to solid foods. ? ? ?Please notify your doctor immediately if you have any unusual bleeding, trouble breathing, redness and pain at the surgery site, drainage, fever, or pain not relieved by medication. ? ? ? ?Additional Instructions: ? ? ? ?Please contact your physician with any problems or Same Day Surgery at [phone], Monday through Friday 6 am to 4 pm, or [facility] [place] Main number at [phone].  ?

## 2022-12-06 NOTE — H&P (Signed)
Consult History and Physical   SERVICE: Gynecology   Patient Name: Melissa Martinez Patient MRN:   [id number]  CC: increasing pain after MTX yesterday for ectopic pregnancy  Percocet is not helping  The patient's last menstrual period occurred on 10/18/2022.  Since that time she has had some light vaginal bleeding. About a week ago she noted vaginal bleeding. She was evaluated in the clinic.   Her hCG levels are as follows: 11/28/2022: 759 11/30/2022: 972 12/05/2022: 1,963 Quant today pending     HPI: Nashya D Schweickert is a 37 y.o. [id number] with 2 weeks of RLQ pain  Worse that lead to ED yesterday . De JAckson eval and TX for ectopic pregnancy given likelihood of ectopic  Pain worsened last pm and significant today eventhough she was on Percocet  U/s yesterday : No intrauterine gestational sac is seen but beta HCG of 1,900.   Heterogeneous thickening at the right adnexa with an anterior masslike structure contiguous but not clearly emanating from the ovary.   Normal appearance of the uterus and left ovary.   Normal low resistance bilateral ovarian blood flow.   Small volume simple pelvic fluid.   These results were called by telephone at the time of interpretation on 12/05/2022 at 4:14 am to provider [facility] , who verbally acknowledged these results.   Review of Systems: positives in bold GEN:   fevers, chills, weight changes, appetite changes, fatigue, night sweats HEENT:  HA, vision changes, hearing loss, congestion, rhinorrhea, sinus pressure, dysphagia CV:   CP, palpitations PULM:  SOB, cough GI:  +abd pain, N/V/D/C GU:  dysuria, urgency, frequency MSK:  arthralgias, myalgias, back pain, swelling SKIN:  rashes, color changes, pallor NEURO:  numbness, weakness, tingling, seizures, dizziness, tremors PSYCH:  depression, anxiety, behavioral problems, confusion  HEME/LYMPH:  easy bruising or bleeding ENDO:  heat/cold intolerance  Past Obstetrical History: OB History      Gravida  3   Para  1   Term      Preterm  1   AB  2   Living  1      SAB  1   IAB      Ectopic  1   Multiple  0   Live Births  1        Obstetric Comments  Spontaneous miscarriage at 8 weeks, 02/2012         Past Gynecologic History: LMP 10/18/22 [id number], s/p 1 c/s , s/p MTX for prior pregnancy   Past Medical History: Past Medical History:  Diagnosis Date   Abdominal pain, generalized 11/11/2014   Altered bowel function 11/11/2014   Anxiety    tkes Prozac, for OCD related anxiety   Bleeding per rectum 11/11/2014   Calculus of kidney 3/61/4431   Complication of anesthesia    sensitive / stops breathing   Fatty infiltration of liver 11/11/2014   Hypertension    Occipital neuralgia    PCOS (polycystic ovarian syndrome)    PONV (postoperative nausea and vomiting)    Upper airway cough syndrome 08/17/2015   Followed in Pulmonary clinic/ [facility]/ [place]      Past Surgical History:   Past Surgical History:  Procedure Laterality Date   ACHILLES TENDON SURGERY Left 04/20/2018   Procedure: ACHILLES TENDON REPAIR-SECONDARY;  Surgeon: Samara Deist, DPM;  Location: [facility];  Service: Podiatry;  Laterality: Left;   BALLOON SINUPLASTY Left 07/17/2019   Procedure: BALLOON SINUPLASTY;  Surgeon: Carloyn Manner, MD;  Location: [facility];  Service: ENT;  Laterality: Left;   CESAREAN SECTION N/A 06/22/2016   Procedure: CESAREAN SECTION;  Surgeon: [name], MD;  Location: [facility];  Service: Obstetrics;  Laterality: N/A;   CHOLECYSTECTOMY     COLONOSCOPY  09/2014   DILATION AND CURETTAGE OF UTERUS  2013   ENDOSCOPIC TURBINATE REDUCTION Bilateral 07/17/2019   Procedure: ENDOSCOPIC TURBINATE REDUCTION;  Surgeon: Bud Face, MD;  Location: [facility];  Service: ENT;  Laterality: Bilateral;  NEED STRYKER DISK GAVE DISK TO BRENDA 8-20  KP rep coming Jessica Nihart   ESOPHAGOGASTRODUODENOSCOPY ENDOSCOPY     EYE SURGERY     lasik    hemmorhoidectomy  2012   HYSTEROSCOPY  2015   LAPAROSCOPIC CHOLECYSTECTOMY  06/2009   LASIK Bilateral    MAXILLARY ANTROSTOMY Right 07/17/2019   Procedure: MAXILLARY ANTROSTOMY;  Surgeon: Bud Face, MD;  Location: [facility];  Service: ENT;  Laterality: Right;   OSTECTOMY Left 04/20/2018   Procedure: OSTECTOMY-HAGLUNDS/RETROCALCANEAL;  Surgeon: Gwyneth Revels, DPM;  Location: [facility];  Service: Podiatry;  Laterality: Left;   STENT PLACE LEFT URETER ([facility] HX) Left 11/2012   has had 2 stones pass stones    Family History:  family history includes Bladder Cancer in her maternal grandfather; Breast cancer in her maternal grandmother; Diabetes in her maternal grandmother; Heart disease in her father and maternal grandfather; Hyperlipidemia in her father; Hypertension in her brother and mother; Kidney Stones in her mother.  Social History:  Social History   Socioeconomic History   Marital status: Married    Spouse name: Not on file   Number of children: Not on file   Years of education: Not on file   Highest education level: Not on file  Occupational History   Occupation: Brewing technologist  Tobacco Use   Smoking status: Never   Smokeless tobacco: Never  Vaping Use   Vaping Use: Never used  Substance and Sexual Activity   Alcohol use: Not Currently    Alcohol/week: 0.0 standard drinks of alcohol   Drug use: No   Sexual activity: Yes    Birth control/protection: Pill  Other Topics Concern   Not on file  Social History Narrative   Not on file   Social Determinants of Health   Financial Resource Strain: Not on file  Food Insecurity: Not on file  Transportation Needs: Not on file  Physical Activity: Not on file  Stress: Not on file  Social Connections: Not on file  Intimate Partner Violence: Not on file    Home Medications:  Medications reconciled in EPIC  No current facility-administered medications on file prior to encounter.   Current Outpatient  Medications on File Prior to Encounter  Medication Sig Dispense Refill   albuterol (VENTOLIN HFA) 108 (90 Base) MCG/ACT inhaler Inhale 2 puffs into the lungs every 6 (six) hours as needed for wheezing or shortness of breath. 8 g 0   amoxicillin (AMOXIL) 875 MG tablet Take 1 tablet (875 mg total) by mouth every 12 (twelve) hours for 7 days 14 tablet 0   ascorbic acid (VITAMIN C) 500 MG tablet Take by mouth.     budesonide (PULMICORT) 1 MG/2ML nebulizer solution USE 1 VIAL VIA NEBULIZER TWICE A DAY AS DIRECTED 120 mL 6   busPIRone (BUSPAR) 5 MG tablet Take 1 tablet (5 mg total) by mouth 2 (two) times daily 60 tablet 11   Cholecalciferol 50 MCG (2000 UT) CAPS Take by mouth.     doxycycline (VIBRA-TABS) 100 MG  tablet Take 1 tablet (100 mg total) by mouth 2 (two) times daily for 10 days 20 tablet 0   doxycycline (VIBRA-TABS) 100 MG tablet Take 1 tablet (100 mg total) by mouth 2 (two) times daily for 10 days 20 tablet 0   drospirenone-ethinyl estradiol (JASMIEL) 3-0.02 MG tablet Take 1 tablet by mouth daily. 84 tablet 1   EPINEPHrine 0.3 mg/0.3 mL IJ SOAJ injection INJECT INTRAMUSCULARLY IN THE EVENT OF AN EMERGENT ALLERGIC REACTION WITH DIFFICULTY BREATHING.     FLUoxetine (PROZAC) 10 MG tablet TAKE 3 TABLETS (30MG ) BY MOUTH DAILY 270 tablet 2   FLUoxetine (PROZAC) 10 MG tablet Take 3 tablets (30 mg total) by mouth daily. 270 tablet 2   fluticasone (FLOVENT HFA) 110 MCG/ACT inhaler Inhale 2 puffs into the lungs in the morning and at bedtime. 12 g 0   gabapentin (NEURONTIN) 100 MG capsule Take 1 capsule by mouth 3 times a day as needed for arm pain for up to 30 days 90 capsule 0   gabapentin (NEURONTIN) 100 MG capsule Take 1 capsule (100 mg total) by mouth 3 (three) times daily as needed for arm pain. 90 capsule 0   hydrochlorothiazide (HYDRODIURIL) 25 MG tablet TAKE 1 TABLET BY MOUTH ONCE DAILY (2-3 DAYS AS DIRECTED) 30 tablet 11   HYDROcodone-acetaminophen (NORCO/VICODIN) 5-325 MG tablet Take 1 tablet  by mouth every 6 (six) hours as needed for up to 5 days for severe pain. 15 tablet 0   hydrocortisone (PROCTO-MED HC) 2.5 % rectal cream Apply topically 3 (three) times daily for 10 days 30 g 0   Insulin Pen Needle (UNIFINE PENTIPS) 31G X 5 MM MISC Use as directed with Saxenda 100 each 0   Insulin Pen Needle (UNIFINE PENTIPS) 31G X 8 MM MISC Use as directed with Saxenda. 100 each 0   Insulin Pen Needle 31G X 5 MM MISC use as directed 100 each 0   Insulin Pen Needle 31G X 5 MM MISC Use as directed 100 each 12   KRILL OIL PO Take 1 capsule by mouth daily.      Liraglutide -Weight Management (SAXENDA) 18 MG/3ML SOPN Inject 3 mg subcutaneously once daily 15 mL 11   Liraglutide -Weight Management (SAXENDA) 18 MG/3ML SOPN Inject 3 mg subcutaneously once daily 15 mL 11   losartan (COZAAR) 50 MG tablet Take 2 tablets (100 mg total) by mouth once daily 60 tablet 11   metFORMIN (GLUCOPHAGE-XR) 500 MG 24 hr tablet Take 1 tablet by mouth 2 (two) times daily.     methocarbamol (ROBAXIN) 500 MG tablet Take 1 tablet (500 mg total) by mouth 2 (two) times daily as needed for muscle spasm. 20 tablet 0   Multiple Vitamins-Minerals (WOMENS MULTIVITAMIN PO) Take by mouth.     ondansetron (ZOFRAN-ODT) 4 MG disintegrating tablet Take 1 tablet (4 mg total) by mouth every 8 (eight) hours as needed 20 tablet 0   phentermine (ADIPEX-P) 37.5 MG tablet TAKE 1/2 TO 1 TABLET BY MOUTH DAILY EVERY MORNING BEFORE BREAKFAST 30 tablet 5   phentermine (ADIPEX-P) 37.5 MG tablet Take 1 tablet (37.5 mg total) by mouth every morning before breakfast 30 tablet 5   predniSONE (DELTASONE) 10 MG tablet Take 4 tablets (40 mg total) by mouth once daily for 2 days, THEN 2 tablets (20 mg total) once daily for 2 days, THEN 1 tablet (10 mg total) once daily for 2 days. 14 tablet 0   predniSONE (DELTASONE) 20 MG tablet Take 1 tablet (20 mg total)  by mouth once daily for 7 days 7 tablet 0   Prenatal Multivit-Min-Fe-FA (PRENATAL, W/IRON & FA,) 27-0.8  MG TABS Take 1 tablet by mouth daily.     promethazine-dextromethorphan (PROMETHAZINE-DM) 6.25-15 MG/5ML syrup Take 5 mLs by mouth 4 (four) times daily as needed for cough. 118 mL 0   Semaglutide-Weight Management 0.25 MG/0.5ML SOAJ Inject 0.25 mg into the skin once a week. 2 mL 1   SODIUM CHLORIDE, EXTERNAL, (SALINE WOUND [place]) 0.9 % SOLN Apply topically 2 (two) times daily.     zinc gluconate 50 MG tablet Take by mouth.      Allergies:  Allergies  Allergen Reactions   Levofloxacin Anaphylaxis, Itching and Other (See Comments)    Throat tightening, generalized itching, redness   Reglan [Metoclopramide] Anxiety    Causes paranoia    Other Other (See Comments)    splenda and equal- itching, hives   Almond (Diagnostic) Hives   Bactrim [Sulfamethoxazole-Trimethoprim] Itching    Also redness   Equal [Aspartame] Itching   Sulfa Antibiotics Other (See Comments)    Redness, hives, itching , skin felt on fire   Tape Hives    Physical Exam:  Temp:  [98.2 F (36.8 C)-98.3 F (36.8 C)] 98.2 F (36.8 C) (01/23 0726) Pulse Rate:  [78-96] 96 (01/23 0726) Resp:  [18] 18 (01/23 0726) BP: (132-140)/(70-75) 140/75 (01/23 0726) SpO2:  [98 %-99 %] 98 % (01/23 0726) Weight:  [108.8 kg] 108.8 kg (01/23 0804)   General Appearance:  Well developed, well nourished, no acute distress, alert and oriented x3 HEENT:  Normocephalic atraumatic, extraocular movements intact, moist mucous membranes Cardiovascular:  Normal S1/S2, regular rate and rhythm, no murmurs Pulmonary:  clear to auscultation, no wheezes, rales or rhonchi, symmetric air entry, good air exchange Abdomen:  ++ RLQ TTP  Extremities:  Full range of motion, no pedal edema, 2+ distal pulses, no tenderness Skin:  normal coloration and turgor, no rashes, no suspicious skin lesions noted  Neurologic:  Cranial nerves 2-12 grossly intact, normal muscle tone, strength 5/5 all four extremities Psychiatric:  Normal mood and affect, appropriate, no  AH/VH Pelvic:  deferred  Labs/Studies:   CBC and Coags:  Lab Results  Component Value Date   WBC 14.2 (H) 12/06/2022   NEUTOPHILPCT 58 12/05/2022   EOSPCT 3 12/05/2022   BASOPCT 1 12/05/2022   LYMPHOPCT 30 12/05/2022   HGB 11.5 (L) 12/06/2022   HCT 35.7 (L) 12/06/2022   MCV 84.8 12/06/2022   PLT 422 (H) 12/06/2022   CMP:  Lab Results  Component Value Date   NA 135 12/06/2022   K 4.1 12/06/2022   CL 103 12/06/2022   CO2 21 (L) 12/06/2022   BUN 7 12/06/2022   CREATININE 0.63 12/06/2022   CREATININE 0.58 12/05/2022   CREATININE 0.62 01/21/2021   PROT 7.8 12/05/2022   BILITOT 0.5 12/05/2022   ALT 25 12/05/2022   AST 28 12/05/2022   ALKPHOS 82 12/05/2022    Other Imaging: US OB LESS THAN 14 WEEKS W/ OB TRANSVAGINAL AND DOPPLER  Result Date: 12/05/2022 CLINICAL DATA:  Vaginal bleeding and first-trimester pregnancy. EXAM: OBSTETRIC <14 WK [place] AND TRANSVAGINAL OB US DOPPLER ULTRASOUND OF OVARIES TECHNIQUE: Both transabdominal and transvaginal ultrasound examinations were performed for complete evaluation of the gestation as well as the maternal uterus, adnexal regions, and pelvic cul-de-sac. Transvaginal technique was performed to assess early pregnancy. Color and duplex Doppler ultrasound was utilized to evaluate blood flow to the ovaries. COMPARISON:  07/13/2020 FINDINGS: No intrauterine  gestational sac is seen but beta HCG of 1,900. Heterogeneous thickening at the right adnexa with an anterior masslike structure contiguous but not clearly emanating from the ovary. Normal appearance of the uterus and left ovary. Normal low resistance bilateral ovarian blood flow. Small volume simple pelvic fluid. These results were called by telephone at the time of interpretation on 12/05/2022 at 4:14 am to provider [facility] , who verbally acknowledged these results. IMPRESSION: 1. Pregnancy of unknown location. Masslike thickening at the right adnexa which increase the chances of right-sided  ectopic pregnancy. 2. Pelvic fluid which is small volume and simple. Electronically Signed   By: Jorje Guild M.D.   On: 12/05/2022 04:14     Assessment / Plan:   Endiya D Marasigan is a 37 y.o. [id number] who presents with increasing pelvic pain on right after being tx with MTX for right ectopic pregnancy  Possible rupture   1. L/S eval and right salpingectomy    Thank you for the opportunity to be involved with this pt's care.

## 2022-12-06 NOTE — Brief Op Note (Signed)
12/06/2022  2:15 PM  PATIENT:  Melissa Martinez  37 y.o. female  PRE-OPERATIVE DIAGNOSIS:  ectopic  POST-OPERATIVE DIAGNOSIS:  ectopic  PROCEDURE:  Procedure(s): DIAGNOSTIC LAPAROSCOPY WITH REMOVAL OF ECTOPIC PREGNANCY WITH RIGHT SALPINGECTOMY (Right)  SURGEON:  Surgeon(s) and Role:    * Jaquel Glassburn, Gwen Her, MD - Primary  PHYSICIAN ASSISTANT: CST   ASSISTANTS: PA student Lilia Pro    ANESTHESIA:   general  EBL: 5 cc , 50 cc hemoperitoneum  IOF 700 cc , UO 250 cc    BLOOD ADMINISTERED:none  DRAINS: none   LOCAL MEDICATIONS USED:  MARCAINE     SPECIMEN:  Source of Specimen:  right fallopian tube with ectopic  DISPOSITION OF SPECIMEN:  PATHOLOGY  COUNTS:  YES  TOURNIQUET:  * No tourniquets in log *  DICTATION: .Other Dictation: Dictation Number verbal   PLAN OF CARE: Discharge to home after PACU  PATIENT DISPOSITION:  PACU - hemodynamically stable.   Delay start of Pharmacological VTE agent (>24hrs) due to surgical blood loss or risk of bleeding: not applicable

## 2022-12-06 NOTE — ED Triage Notes (Signed)
Patient to ED for abd pain. 7 weeks confirmed preg- ectopic. Given methotrexate and rhogam. Told to come back if pain increased and has per patient. Has taken percocet for pain.  Patient asked to not have labs drawn until IV placed.

## 2022-12-06 NOTE — Anesthesia Procedure Notes (Signed)
Procedure Name: Intubation Date/Time: 12/06/2022 12:54 PM  Performed by: Johnna Acosta, CRNAPre-anesthesia Checklist: Patient identified, Emergency Drugs available, Suction available, Patient being monitored and Timeout performed Patient Re-evaluated:Patient Re-evaluated prior to induction Oxygen Delivery Method: Circle system utilized Preoxygenation: Pre-oxygenation with 100% oxygen Induction Type: Rapid sequence and IV induction Laryngoscope Size: McGraph and 3 Grade View: Grade I Tube type: Oral Tube size: 6.5 mm Airway Equipment and Method: Stylet and Video-laryngoscopy Placement Confirmation: ETT inserted through vocal cords under direct vision, positive ETCO2 and breath sounds checked- equal and bilateral Secured at: 21 cm Tube secured with: Tape Dental Injury: Teeth and Oropharynx as per pre-operative assessment

## 2022-12-06 NOTE — ED Provider Notes (Signed)
[facility] Provider Note    Event Date/Time   First MD Initiated Contact with Patient 12/06/22 [phone]     (approximate)   History   Abdominal Pain   HPI  Melissa Martinez is a 37 y.o. female G4, P1 who presents to the emergency department with right lower quadrant abdominal pain.  Patient was evaluated in the emergency department yesterday and diagnosed with an ectopic pregnancy after an ultrasound.  Ultimately after shared decision making with Dr. Glennon Mac with OB decided on methotrexate instead of surgical intervention.  Was told to return to the emergency department for any worsening abdominal pain.  States that she had worsening right lower quadrant abdominal pain this morning that did not improve with 10 mg of Percocet at home.  Denies any vaginal bleeding.  Denies any nausea or vomiting.  States this pain has increased compared to her pain yesterday.  Patient is O-   Physical Exam   Triage Vital Signs: ED Triage Vitals [12/06/22 0726]  Enc Vitals Group     BP (!) 140/75     Pulse Rate 96     Resp 18     Temp 98.2 F (36.8 C)     Temp Source Oral     SpO2 98 %     Weight      Height      Head Circumference      Peak Flow      Pain Score 7     Pain Loc      Pain Edu?      Excl. in [place]?     Most recent vital signs: Vitals:   12/06/22 0726  BP: (!) 140/75  Pulse: 96  Resp: 18  Temp: 98.2 F (36.8 C)  SpO2: 98%    Physical Exam Constitutional:      Appearance: She is well-developed.  HENT:     Head: Atraumatic.  Eyes:     Conjunctiva/sclera: Conjunctivae normal.  Cardiovascular:     Rate and Rhythm: Regular rhythm.  Pulmonary:     Effort: No respiratory distress.  Abdominal:     General: There is no distension.     Tenderness: There is abdominal tenderness in the right lower quadrant and suprapubic area.  Musculoskeletal:        General: Normal range of motion.     Cervical back: Normal range of motion.  Skin:    General: Skin  is warm.  Neurological:     Mental Status: She is alert. Mental status is at baseline.      IMPRESSION / MDM / ASSESSMENT AND PLAN / ED COURSE  I reviewed the triage vital signs and the nursing notes.  Differential diagnosis concerning for ruptured ectopic pregnancy, appendicitis, pyelonephritis, ovarian torsion  On chart review of patient's outside records from her obstetrician patient had a quant in December" yesterday that was elevated and 1900, ultrasound showed no IUP and thickening adjacent to R ovary.  Ultimately the patient received methotrexate.  Labs (all labs ordered are listed, but only abnormal results are displayed) Labs interpreted as -  Mild leukocytosis.  No significant anemia.  Creatinine at baseline.  No significant electrolyte abnormalities.  Platelets are within normal limits.  Quant currently pending.  Labs Reviewed  CBC - Abnormal; Notable for the following components:      Result Value   WBC 14.2 (*)    Hemoglobin 11.5 (*)    HCT 35.7 (*)    Platelets 422 (*)  All other components within normal limits  BASIC METABOLIC PANEL - Abnormal; Notable for the following components:   CO2 21 (*)    Glucose, Bld 129 (*)    All other components within normal limits  HCG, QUANTITATIVE, PREGNANCY    Consulted and discussed the patient's case with obstetrician on-call for [facility] clinic who came and evaluated the patient in the emergency department, plan to go to the OR for surgical removal of ectopic pregnancy.     PROCEDURES:  Critical Care performed: yes  .Critical Care  Performed by: Nathaniel Man, MD Authorized by: Nathaniel Man, MD   Critical care provider statement:    Critical care time (minutes):  30   Critical care time was exclusive of:  Separately billable procedures and treating other patients   Critical care was necessary to treat or prevent imminent or life-threatening deterioration of the following conditions: ectopic to OR.   Critical  care was time spent personally by me on the following activities:  Development of treatment plan with patient or surrogate, discussions with consultants, evaluation of patient's response to treatment, examination of patient, ordering and review of laboratory studies, ordering and review of radiographic studies, ordering and performing treatments and interventions, pulse oximetry, re-evaluation of patient's condition and review of old charts   Patient's presentation is most consistent with acute presentation with potential threat to life or bodily function.   MEDICATIONS ORDERED IN ED: Medications  HYDROmorphone (DILAUDID) injection 0.5 mg (0.5 mg Intravenous Given 12/06/22 0901)  ondansetron (ZOFRAN) injection 4 mg (4 mg Intravenous Given 12/06/22 0902)    FINAL CLINICAL IMPRESSION(S) / ED DIAGNOSES   Final diagnoses:  Right lower quadrant abdominal pain  Ectopic pregnancy without intrauterine pregnancy, unspecified location     Rx / DC Orders   ED Discharge Orders     None        Note:  This document was prepared using Dragon voice recognition software and may include unintentional dictation errors.   Nathaniel Man, MD 12/06/22 [phone]

## 2022-12-06 NOTE — Transfer of Care (Signed)
Immediate Anesthesia Transfer of Care Note  Patient: Melissa Martinez  Procedure(s) Performed: DIAGNOSTIC LAPAROSCOPY WITH REMOVAL OF ECTOPIC PREGNANCY WITH RIGHT SALPINGECTOMY (Right: Abdomen)  Patient Location: PACU  Anesthesia Type:General  Level of Consciousness: awake, alert , and drowsy  Airway & Oxygen Therapy: Patient Spontanous Breathing and Patient connected to face mask oxygen  Post-op Assessment: Report given to RN and Post -op Vital signs reviewed and stable  Post vital signs: Reviewed and stable  Last Vitals:  Vitals Value Taken Time  BP 106/56 12/06/22 1429  Temp    Pulse 83 12/06/22 1429  Resp 27 12/06/22 1429  SpO2 99 % 12/06/22 1429  Vitals shown include unvalidated device data.  Last Pain:  Vitals:   12/06/22 1227  TempSrc: Temporal  PainSc: 3          Complications: No notable events documented.

## 2022-12-06 NOTE — Anesthesia Preprocedure Evaluation (Signed)
Anesthesia Evaluation  Patient identified by MRN, date of birth, ID band Patient awake    Reviewed: Allergy & Precautions, NPO status , Patient's Chart, lab work & pertinent test results  History of Anesthesia Complications (+) PONV, PROLONGED EMERGENCE and history of anesthetic complications  Airway Mallampati: II  TM Distance: >3 FB Neck ROM: Full    Dental no notable dental hx. (+) Teeth Intact   Pulmonary neg sleep apnea, neg COPD, Recent URI , Residual Cough, Patient abstained from smoking.Not current smoker Active URI / bronchitis. Coughing up some phlegm. Hoarse voice from coughing. Gets soome post -tussive nausea/emesis. Has taken two days of steroid taper. Took albuterol nebulizer this morning   Pulmonary exam normal breath sounds clear to auscultation       Cardiovascular Exercise Tolerance: Good METShypertension, Pt. on medications (-) CAD and (-) Past MI (-) dysrhythmias  Rhythm:Regular Rate:Normal - Systolic murmurs    Neuro/Psych  Headaches PSYCHIATRIC DISORDERS Anxiety        GI/Hepatic ,neg GERD  ,,(+)     (-) substance abuse    Endo/Other  neg diabetes    Renal/GU negative Renal ROS     Musculoskeletal   Abdominal  (+) + obese Abdomen: tender.   Peds  Hematology   Anesthesia Other Findings Past Medical History: 11/11/2014: Abdominal pain, generalized 11/11/2014: Altered bowel function No date: Anxiety     Comment:  tkes Prozac, for OCD related anxiety 11/11/2014: Bleeding per rectum 12/14/2015: Calculus of kidney No date: Complication of anesthesia     Comment:  sensitive / stops breathing 11/11/2014: Fatty infiltration of liver No date: Hypertension No date: Occipital neuralgia No date: PCOS (polycystic ovarian syndrome) No date: PONV (postoperative nausea and vomiting) 08/17/2015: Upper airway cough syndrome     Comment:  Followed in Pulmonary clinic/ [facility]/ [place]     Reproductive/Obstetrics (+) Pregnancy Ectopic pregnancy                               Anesthesia Physical Anesthesia Plan  ASA: 3 and emergent  Anesthesia Plan: General   Post-op Pain Management: Ofirmev IV (intra-op)* and Toradol IV (intra-op)*   Induction: Intravenous and Rapid sequence  PONV Risk Score and Plan: 4 or greater and Ondansetron, Dexamethasone, Midazolam, TIVA and Propofol infusion  Airway Management Planned: Oral ETT and Video Laryngoscope Planned  Additional Equipment: None  Intra-op Plan:   Post-operative Plan: Extubation in OR  Informed Consent: I have reviewed the patients History and Physical, chart, labs and discussed the procedure including the risks, benefits and alternatives for the proposed anesthesia with the patient or authorized representative who has indicated his/her understanding and acceptance.     Dental advisory given  Plan Discussed with: CRNA and Surgeon  Anesthesia Plan Comments: (Discussed risks of anesthesia with patient, including PONV, sore throat, lip/dental/eye damage. Rare risks discussed as well, such as cardiorespiratory and neurological sequelae, and allergic reactions. Discussed the role of CRNA in patient's perioperative care. Patient understands. Patient counseled on being higher risk for anesthesia due to comorbidities: active URI. Patient was told about increased risk of cardiac and respiratory events. 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 I assured her that we will take utmost care in making sure she is safely anesthetized without administering more than is necessary. She understands.)  Anesthesia Quick Evaluation  

## 2022-12-06 NOTE — Op Note (Signed)
NAME: Melissa Martinez, Melissa Martinez MEDICAL RECORD NO: [id number] ACCOUNT NO: [id number] DATE OF BIRTH: 16-Sep-1986 FACILITY: [facility] LOCATION: [facility] PHYSICIAN: Boykin Nearing, MD  Operative Report   DATE OF PROCEDURE: 12/06/2022  PREOPERATIVE DIAGNOSIS:  Right tubal ectopic pregnancy, failed methotrexate treatment.  POSTOPERATIVE DIAGNOSIS:  Right tubal ectopic pregnancy, failed methotrexate treatment.  PROCEDURE:  Laparoscopic right salpingectomy.  SURGEON:  Boykin Nearing, MD  FIRST ASSISTANT:  CST.  SECOND ASSISTANT:  PA student Lilia Pro.  ANESTHESIA:  General endotracheal anesthesia.  INDICATIONS:  A 37 year old gravida 4, para 1, the patient was seen in the emergency room 24 hours before the procedure and was diagnosed with an ectopic pregnancy and treated with methotrexate.  The patient returned the day of the procedure with  increasing right sided pelvic pain despite narcotic use.  The patient did receive RhoGAM in the emergency department previous day.  DESCRIPTION OF PROCEDURE:  After adequate general endotracheal anesthesia, the patient was placed in dorsal supine position with the legs in the [place] stirrups.  The patient's abdomen, perineum and vagina were prepped and draped in normal sterile  fashion.  Timeout was performed.  A speculum was placed in the vagina and the anterior cervix was grasped with a single tooth tenaculum and a sponge stick was placed into the vagina.  Foley catheter was placed, yielding 200 mL clear urine.  Gloves and  gown were changed.  Attention was directed to the patient's abdomen and was then an infraumbilical incision was made after injecting with 0.5% Marcaine 5 mm laparoscope was advanced into the abdominal cavity under direct visualization with the Optiview  cannula.  Upon entry into the abdominal cavity the patient's abdomen was insufflated with carbon dioxide.  There was initial hemoperitoneum that was noted approximately 50 mL. A left  port site was placed left lower quadrant, 3 cm medial to the left  anterior iliac spine and 10/11 mm trocar was advanced into the abdominal cavity under direct visualization.  Attention was directed to the patient's right fallopian tube, which was distended with approximately 3 cm with a hemorrhagic clot in the  midportion of the fallopian tube, all consistent with an ectopic pregnancy.  Second port was placed right lower quadrant.  A 3 cm medial to the right anterior iliac spine and a 5 mm trocar was advanced into the abdominal cavity under direct  visualization.  The right fallopian tube was grasped with the distal portion and the mesosalpinx was cauterized with the Kleppinger and Harmonic scalpel and the right fallopian tube was removed at the isthmic portion of the fallopian tube, incorporating  the ectopic pregnancy.  An Endobag was placed in the abdominal cavity and the right fallopian tube and ectopic pregnancy were removed through the left lower port site.  The patient's abdomen was copiously irrigated.  There was no evidence of significant  scar tissue nor any evidence of endometriosis.  The left fallopian tube appeared normal.  The right ovary appeared normal.  Upper abdomen appeared normal.  The patient's abdomen was deflated to 7 mmHg and good hemostasis noted.  The patient's abdomen was  reinsufflated and the Carter-Thomason cone was placed in the left lower port site and the fascial edges were closed with a 2-0 Vicryl suture and the rest of the deflation took place.  Instruments were removed.  All skin incisions were closed with 4-0  Vicryl suture.  Sterile dressing applied.  The single tooth tenaculum was removed from the anterior cervix and a sponge stick was removed  as well as a Foley catheter.  Good hemostasis of the cervix.  ESTIMATED BLOOD LOSS:  5 mL with a noted 50 mL hemoperitoneum at the beginning of the case.  URINE OUTPUT:  250 mL  INTRAOPERATIVE FLUIDS:  700 mL.  The patient  did receive Toradol at the end of the case and there were no complications and she was taken to recovery room in good condition.   PUS D: 12/06/2022 2:33:30 pm T: 12/06/2022 3:08:00 pm  JOB: [id number]/ [id number]

## 2022-12-07 ENCOUNTER — Encounter: Payer: Self-pay | Admitting: Obstetrics and Gynecology

## 2022-12-07 NOTE — Anesthesia Postprocedure Evaluation (Signed)
Anesthesia Post Note  Patient: Melissa Martinez  Procedure(s) Performed: DIAGNOSTIC LAPAROSCOPY WITH REMOVAL OF ECTOPIC PREGNANCY WITH RIGHT SALPINGECTOMY (Right: Abdomen)  Patient location during evaluation: PACU Anesthesia Type: General Level of consciousness: awake and alert Pain management: pain level controlled Vital Signs Assessment: post-procedure vital signs reviewed and stable Respiratory status: spontaneous breathing, nonlabored ventilation, respiratory function stable and patient connected to nasal cannula oxygen Cardiovascular status: blood pressure returned to baseline and stable Postop Assessment: no apparent nausea or vomiting Anesthetic complications: no   No notable events documented.   Last Vitals:  Vitals:   12/06/22 1530 12/06/22 1555  BP: 120/64 120/67  Pulse: 74 73  Resp: 16 16  Temp: (!) 36.2 C 36.5 C  SpO2: 95% 100%    Last Pain:  Vitals:   12/06/22 1555  TempSrc: Temporal  PainSc:                  Precious Haws Margeart Allender

## 2022-12-08 LAB — SURGICAL PATHOLOGY

## 2022-12-18 DIAGNOSIS — Q796 Ehlers-Danlos syndrome, unspecified: Secondary | ICD-10-CM

## 2022-12-18 HISTORY — DX: Ehlers-Danlos syndrome, unspecified: Q79.60

## 2022-12-20 DIAGNOSIS — Q796 Ehlers-Danlos syndrome, unspecified: Secondary | ICD-10-CM | POA: Insufficient documentation

## 2023-01-05 ENCOUNTER — Ambulatory Visit: Payer: Commercial Managed Care - PPO | Attending: Obstetrics | Admitting: Obstetrics

## 2023-01-05 ENCOUNTER — Other Ambulatory Visit: Payer: Self-pay

## 2023-01-05 VITALS — BP 147/71 | Temp 97.8°F | Resp 16 | Ht 68.0 in | Wt 249.5 lb

## 2023-01-05 DIAGNOSIS — I10 Essential (primary) hypertension: Secondary | ICD-10-CM | POA: Diagnosis not present

## 2023-01-05 DIAGNOSIS — O09891 Supervision of other high risk pregnancies, first trimester: Secondary | ICD-10-CM

## 2023-01-05 DIAGNOSIS — Z3169 Encounter for other general counseling and advice on procreation: Secondary | ICD-10-CM

## 2023-01-05 DIAGNOSIS — Q7962 Hypermobile Ehlers-Danlos syndrome: Secondary | ICD-10-CM

## 2023-01-05 DIAGNOSIS — O10919 Unspecified pre-existing hypertension complicating pregnancy, unspecified trimester: Secondary | ICD-10-CM

## 2023-01-05 DIAGNOSIS — Q796 Ehlers-Danlos syndrome, unspecified: Secondary | ICD-10-CM

## 2023-01-09 NOTE — Progress Notes (Signed)
MFM Note  Melissa Martinez is a 37 year old gravida 4 para 1 who is seen for preconception consultation to discuss the management of her future pregnancy due to chronic hypertension treated with losartan 25 mg twice a day and recently diagnosed hypermobility type Ehlers-Danlos syndrome.  She had an ectopic pregnancy last month during which time she was initially treated with methotrexate  followed by a laparoscopic right salpingectomy due to worsening pelvic pressure/pain.  She received RhoGAM following her ectopic pregnancy as her blood type is O-.  The patient is currently treated with multiple medications for her nonspecific symptoms such as idiopathic fever, joint pain, swelling, increased liver function tests, and increased C-reactive protein tests.  Her antinuclear (ANA) antibody test drawn 2 weeks ago was positive.  Her complement levels and rheumatoid factor were all negative.  She has not been diagnosed with lupus.  Her P/C ratio did not indicate significant proteinuria (less than 45 mg) and her serum creatinine level was 0.7.  Her current medications are the following: Losartan Fluoxetine Albuterol Amoxicillin Vitamin C Budesonide BuSpar Vitamin D Birth control pills EpiPen as needed Gabapentin for nerve pain Saxenda daily injection for weight loss Robaxin Multivitamin Zofran as needed Phentermine for weight loss Prednisone taper Prenatal vitamins   Her first pregnancy in 2017 resulted in a cesarean delivery at 34 weeks and 5 days due to severe preeclampsia.  The patient is planning on conceiving another pregnancy probably later this year as she is giving herself time for the methotrexate she received last month and a recent COVID infection to clear.  The following were discussed during today's consultation:  Chronic hypertension and pregnancy  She was advised that ACE inhibitor's and angiotensin receptor blockers such as losartan are contraindicated in pregnancy.  She was  advised to contact her primary care doctor to be switched to an antihypertensive medication such as labetalol or nifedipine (Procardia) that is safe to take in pregnancy.  She should make sure that her blood pressures are adequately controlled using labetalol or nifedipine prior to conceiving another pregnancy.  Hypermobility type Ehlers-Danlos syndrome and pregnancy   The implications and management of Ehlers-Danlos syndrome in pregnancy was discussed.   Although Ehlers-Danlos syndrome in pregnancy has been associated with an increased risk of preterm labor, preterm delivery, and premature rupture of membranes, she was reassured that the majority of women with hypermobility type Ehlers-Danlos syndrome can anticipate a normal pregnancy outcome.  She should have an echocardiogram performed with cardiology prior to conceiving a pregnancy to ensure that she does not have any structural heart defects or aortic root dilatation.    She should have another echocardiogram performed in the third trimester of her future pregnancy.  She was advised that genetic testing for the hypermobility type of Ehlers-Danlos syndrome is not available.  Most Ehlers-Danlos syndromes are transmitted in an autosomal dominant fashion. If her condition is transmitted in an autosomal dominant fashion, then roughly 50% of her offspring will inherit this condition.    Medication treatment during pregnancy  The patient was advised that the following medications from the list above should not be taken during pregnancy: Losartan, gabapentin, Robaxin,  Saxenda, birth control pills, and phentermine.   She was advised that all of the other medications on that list can be taken in pregnancy if necessary.    She was encouraged to lose weight prior to conceiving her future pregnancy.  Her losartan should be switched to another antihypertensive medication that is safe in pregnancy such as labetalol and Procardia.  Due to chronic  hypertension and history of superimposed preeclampsia in her prior pregnancy, she should start taking 2 tablets of baby aspirin (81 mg each) starting at around 10 to 12 weeks of her future pregnancy and continued until delivery for preeclampsia prophylaxis.  Management of her future pregnancy  Once she conceives, she should have a cell free DNA test drawn at between 10 to 12 weeks to screen for fetal aneuploidy.    As her blood type is O-, consideration should be given to having the [facility] cell free DNA test drawn to determine the fetal Rh status.    Should it be determined from the [facility] cell free DNA test that the fetus is blood type is Rh-, she will not need shots of RhoGAM during her future pregnancy.    She should be referred to the MFM office at between 10 to 12 weeks for a first trimester ultrasound.    She should start taking 2 tablets of baby aspirin for preeclampsia prophylaxis starting at around 10 to 12 weeks.  We will perform a transvaginal ultrasound for cervical length measurement at around 16 weeks.    She should have a detailed fetal anatomy scan at 19 weeks.    Due to chronic hypertension and Ehlers-Danlos syndrome, we will continue to follow her with growth ultrasounds every 4 to 5 weeks.  Weekly fetal testing should be started at around 32 weeks.  She should have a maternal echocardiogram performed again sometime during the third trimester to assess her cardiac function.  Delivery should occur at between 37 to 39 weeks depending on her blood pressure control.   At the end of the consultation, the patient stated that all of her questions had been answered.    A total of 60 minutes was spent counseling and coordinating the care for this patient.  Greater than 50% of the time was spent in direct face-to-face contact.

## 2023-01-12 ENCOUNTER — Ambulatory Visit
Admission: RE | Admit: 2023-01-12 | Discharge: 2023-01-12 | Disposition: A | Discharge status: Home / Self Care | Payer: BC Managed Care – PPO | Source: Ambulatory Visit | Attending: Student | Admitting: Student

## 2023-01-12 ENCOUNTER — Other Ambulatory Visit: Payer: Self-pay | Admitting: Student

## 2023-01-12 DIAGNOSIS — R051 Acute cough: Secondary | ICD-10-CM

## 2023-11-14 ENCOUNTER — Ambulatory Visit
Admission: EM | Admit: 2023-11-14 | Discharge: 2023-11-14 | Disposition: A | Discharge status: Home / Self Care | Payer: BC Managed Care – PPO | Attending: Internal Medicine | Admitting: Internal Medicine

## 2023-11-14 ENCOUNTER — Ambulatory Visit (INDEPENDENT_AMBULATORY_CARE_PROVIDER_SITE_OTHER): Payer: BC Managed Care – PPO

## 2023-11-14 DIAGNOSIS — J40 Bronchitis, not specified as acute or chronic: Secondary | ICD-10-CM | POA: Diagnosis not present

## 2023-11-14 DIAGNOSIS — J329 Chronic sinusitis, unspecified: Secondary | ICD-10-CM

## 2023-11-14 DIAGNOSIS — R051 Acute cough: Secondary | ICD-10-CM

## 2023-11-14 MED ORDER — AMOXICILLIN-POT CLAVULANATE 875-125 MG PO TABS: 1.0000 | ORAL_TABLET | Freq: Two times a day (BID) | ORAL | 0 refills | Status: AC

## 2023-11-14 NOTE — Discharge Instructions (Signed)
  Try using inhaler a few more times daily.   If no improvement with treatment please follow up with PCP.

## 2023-11-14 NOTE — ED Triage Notes (Signed)
 First week of Novemeber this started, Did a telehealth around Thanksgiving given antibiotics with steroids, Seen again after this and treated again, Still having viscous cough/congestion, last time I did tele health recommended CXR if no improvement, 3-COVID19 tests-All Negative. No fevers. Lethargy, Wide spread pain. No maxillary pain just frontal.

## 2023-11-19 NOTE — ED Provider Notes (Signed)
 [facility]    CSN: [id number] Arrival date & time: 11/14/23  1509      History   Chief Complaint Chief Complaint  Patient presents with   Cough   Fatigue   Nasal Congestion    HPI Melissa Martinez is a 38 y.o. female.   Patient here today for evaluation of continued cough congestion that started a couple months ago.  She reports that she was seen and prescribed antibiotics and steroids and then was prescribed additional antibiotics and steroids but has not had resolution.  She feels that 7 days of Augmentin  was not sufficient to clear symptoms.  She has not had fever.  She reports she does feel fatigued.  The history is provided by the patient.  Cough Associated symptoms: sore throat   Associated symptoms: no chills, no ear pain, no eye discharge, no fever, no shortness of breath and no wheezing     Past Medical History:  Diagnosis Date   Abdominal pain, generalized 11/11/2014   Altered bowel function 11/11/2014   Anxiety    tkes Prozac , for OCD related anxiety   Bleeding per rectum 11/11/2014   Calculus of kidney 12/14/2015   Complication of anesthesia    sensitive / stops breathing   Ehlers-Danlos syndrome 12/18/2022   Fatty infiltration of liver 11/11/2014   Hypertension    Occipital neuralgia    PCOS (polycystic ovarian syndrome)    PONV (postoperative nausea and vomiting)    Upper airway cough syndrome 08/17/2015   Followed in Pulmonary clinic/ [facility]/ [place]      Patient Active Problem List   Diagnosis Date Noted   Ehlers-Danlos disease 12/20/2022   Ehlers-Danlos syndrome 12/18/2022   Ectopic pregnancy 12/05/2022   Fatigue 04/14/2020   Low grade fever 04/14/2020   Essential hypertension 02/12/2020   Allergy to almonds 08/23/2018   Vaccine reaction, initial encounter 08/23/2018   Allergic rhinitis 08/23/2018   Chronic neck pain 04/16/2018   Headache disorder 04/16/2018   Asymptomatic hyperuricemia 02/05/2018   Chronic pain of  left heel 02/05/2018   Screening for cervical cancer 08/01/2017   Nausea 07/03/2017   Dehydration 06/12/2017   Adenitis 05/23/2017   Adenitis 05/23/2017   Swelling, lymph nodes 05/23/2017   Closed Colles' fracture of left radius 10/17/2016   Kidney stones 12/14/2015   PCOS (polycystic ovarian syndrome) 12/14/2015   Upper airway cough syndrome 08/17/2015   Upper airway cough syndrome 08/17/2015   Chronic cough 08/17/2015   Abdominal pain, chronic, generalized 11/11/2014   Bleeding per rectum 11/11/2014   Change in bowel habits 11/11/2014   Fatty infiltration of liver 11/11/2014   Bright red rectal bleeding 11/11/2014    Past Surgical History:  Procedure Laterality Date   ACHILLES TENDON SURGERY Left 04/20/2018   Procedure: ACHILLES TENDON REPAIR-SECONDARY;  Surgeon: Ashley Soulier, DPM;  Location: [facility];  Service: Podiatry;  Laterality: Left;   BALLOON SINUPLASTY Left 07/17/2019   Procedure: BALLOON SINUPLASTY;  Surgeon: Milissa Hamming, MD;  Location: [facility];  Service: ENT;  Laterality: Left;   CESAREAN SECTION N/A 06/22/2016   Procedure: CESAREAN SECTION;  Surgeon: Lamar SHAUNNA Lesches, MD;  Location: [facility];  Service: Obstetrics;  Laterality: N/A;   CHOLECYSTECTOMY     COLONOSCOPY  09/2014   DIAGNOSTIC LAPAROSCOPY WITH REMOVAL OF ECTOPIC PREGNANCY Right 12/06/2022   Procedure: DIAGNOSTIC LAPAROSCOPY WITH REMOVAL OF ECTOPIC PREGNANCY WITH RIGHT SALPINGECTOMY;  Surgeon: Lovetta Debby PARAS, MD;  Location: [facility];  Service: Gynecology;  Laterality: Right;  DILATION AND CURETTAGE OF UTERUS  2013   ENDOSCOPIC TURBINATE REDUCTION Bilateral 07/17/2019   Procedure: ENDOSCOPIC TURBINATE REDUCTION;  Surgeon: Milissa Hamming, MD;  Location: [facility];  Service: ENT;  Laterality: Bilateral;  NEED STRYKER DISK GAVE DISK TO BRENDA 8-20  KP rep coming Jessica Nihart   ESOPHAGOGASTRODUODENOSCOPY ENDOSCOPY     EYE SURGERY     lasik   hemmorhoidectomy  2012    HYSTEROSCOPY  2015   LAPAROSCOPIC CHOLECYSTECTOMY  06/2009   LASIK Bilateral    MAXILLARY ANTROSTOMY Right 07/17/2019   Procedure: MAXILLARY ANTROSTOMY;  Surgeon: Milissa Hamming, MD;  Location: [facility];  Service: ENT;  Laterality: Right;   OSTECTOMY Left 04/20/2018   Procedure: OSTECTOMY-HAGLUNDS/RETROCALCANEAL;  Surgeon: Ashley Soulier, DPM;  Location: [facility];  Service: Podiatry;  Laterality: Left;   STENT PLACE LEFT URETER ([facility] HX) Left 11/2012   has had 2 stones pass stones    OB History     Gravida  4   Para  1   Term      Preterm  1   AB  2   Living  1      SAB  1   IAB      Ectopic  1   Multiple  0   Live Births  1        Obstetric Comments  Spontaneous miscarriage at 8 weeks, 02/2012          Home Medications    Prior to Admission medications   Medication Sig Start Date End Date Taking? Authorizing Provider  albuterol  (PROVENTIL ) (2.5 MG/3ML) 0.083% nebulizer solution Take 2.5 mg by nebulization every 6 (six) hours as needed for wheezing or shortness of breath. With Budesonide . 02/14/23  Yes [provider]  albuterol  (VENTOLIN  HFA) 108 (90 Base) MCG/ACT inhaler Inhale 2 puffs into the lungs every 4 (four) hours as needed for wheezing or shortness of breath. 11/05/19  Yes [provider]  amoxicillin -clavulanate (AUGMENTIN ) 875-125 MG tablet Take 1 tablet by mouth every 12 (twelve) hours for 10 days. 11/14/23 11/24/23 Yes Billy Asberry FALCON, PA-C  azithromycin  (ZITHROMAX ) 250 MG tablet Take 250 mg by mouth as directed. 02/14/23  Yes [provider]  busPIRone  (BUSPAR ) 5 MG tablet Take 1 tablet by mouth 2 (two) times daily. 08/28/23  Yes [provider]  clobetasol (TEMOVATE) 0.05 % external solution Apply 1 Application topically 2 (two) times daily. 08/07/23  Yes [provider]  Fluoxetine  HCl, PMDD, 10 MG TABS Take 3 tablets by mouth daily. 06/22/23  Yes [provider]  ibuprofen  (ADVIL ) 800  MG tablet Take 800 mg by mouth 3 (three) times daily. 08/03/23  Yes [provider]  methylPREDNISolone  (MEDROL  DOSEPAK) 4 MG TBPK tablet Take 4 mg by mouth as directed. 10/12/23  Yes [provider]  omeprazole  (PRILOSEC) 20 MG capsule Take 20 mg by mouth daily. 04/20/23 04/19/24 Yes [provider]  ondansetron  (ZOFRAN ) 8 MG tablet Take 8 mg by mouth every 8 (eight) hours as needed for nausea. 12/06/22  Yes [provider]  Semaglutide , 2 MG/DOSE, 8 MG/3ML SOPN Inject 0.75 mLs into the skin once a week. 05/24/23  Yes [provider]  Semaglutide ,0.25 or 0.5MG /DOS, 2 MG/3ML SOPN Inject 0.5 mg into the skin as directed. 03/10/23  Yes [provider]  albuterol  (VENTOLIN  HFA) 108 (90 Base) MCG/ACT inhaler Inhale 2 puffs into the lungs every 6 (six) hours as needed for wheezing or shortness of breath. 08/03/21  Kennyth Domino, FNP  ALPRAZolam (XANAX) 0.25 MG tablet Take 0.25 mg by mouth 3 (three) times daily as needed for anxiety.    [provider]  amoxicillin  (AMOXIL ) 875 MG tablet Take 1 tablet (875 mg total) by mouth every 12 (twelve) hours for 7 days 04/18/22     amoxicillin  (AMOXIL ) 875 MG tablet Take 1 tablet by mouth every 12 (twelve) hours.    [provider]  ascorbic acid (VITAMIN C) 500 MG tablet Take by mouth.    [provider]  ascorbic acid (VITAMIN C) 500 MG tablet Take 100 mg by mouth 2 (two) times daily.    [provider]  azelastine (ASTELIN) 0.1 % nasal spray Place 1 spray into both nostrils 2 (two) times daily.    [provider]  benzonatate  (TESSALON ) 100 MG capsule Take 100 mg by mouth 3 (three) times daily as needed.    [provider]  budesonide  (PULMICORT ) 1 MG/2ML nebulizer solution USE 1 VIAL VIA NEBULIZER TWICE A DAY AS DIRECTED 03/01/22 03/01/23    busPIRone  (BUSPAR ) 5 MG tablet Take 1 tablet (5 mg total) by mouth 2 (two) times daily 08/17/21     cefdinir (OMNICEF) 300 MG  capsule Take 300 mg by mouth 2 (two) times daily.    [provider]  Cholecalciferol  (D2000 ULTRA STRENGTH) 50 MCG (2000 UT) CAPS Take by mouth.    [provider]  Cholecalciferol  50 MCG (2000 UT) CAPS Take by mouth.    [provider]  cyanocobalamin (VITAMIN B12) 1000 MCG tablet Take 1 tablet by mouth daily.    [provider]  drospirenone -ethinyl estradiol  (GIANVI) 3-0.02 MG tablet Take 1 tablet by mouth daily.    [provider]  drospirenone -ethinyl estradiol  (JASMIEL ) 3-0.02 MG tablet Take 1 tablet by mouth daily. 02/09/22   Arloa Lamar SQUIBB, MD  drospirenone -ethinyl estradiol  (YAZ) 3-0.02 MG tablet Take 1 tablet by mouth daily.    [provider]  EPINEPHrine  0.3 mg/0.3 mL IJ SOAJ injection INJECT INTRAMUSCULARLY IN THE EVENT OF AN EMERGENT ALLERGIC REACTION WITH DIFFICULTY BREATHING. 08/22/18   [provider]  FLUoxetine  (PROZAC ) 10 MG tablet TAKE 3 TABLETS (30MG ) BY MOUTH DAILY 06/24/20 06/24/21  Whitaker, Selinda Hestle, PA-C  FLUoxetine  (PROZAC ) 10 MG tablet Take 3 tablets (30 mg total) by mouth daily. 08/17/21     fluticasone  (FLOVENT  HFA) 110 MCG/ACT inhaler Inhale 2 puffs into the lungs in the morning and at bedtime. Patient not taking: Reported on 01/05/2023 08/03/21   Kennyth Domino, FNP  gabapentin  (NEURONTIN ) 100 MG capsule Take 1 capsule by mouth 3 times a day as needed for arm pain for up to 30 days 02/10/22     gabapentin  (NEURONTIN ) 100 MG capsule Take 1 capsule (100 mg total) by mouth 3 (three) times daily as needed for arm pain. Patient not taking: Reported on 01/05/2023 08/19/22   Sharrie Prentice PARAS, DO  hydrochlorothiazide (HYDRODIURIL) 25 MG tablet TAKE 1 TABLET BY MOUTH ONCE DAILY (2-3 DAYS AS DIRECTED) 09/08/20 09/08/21  Whitaker, Selinda Hestle, PA-C  hydrocortisone  (PROCTO-MED HC ) 2.5 % rectal cream Apply topically 3 (three) times daily for 10 days 03/10/22     Insulin  Pen Needle (UNIFINE PENTIPS) 31G X 5 MM MISC Use  as directed with Saxenda  05/31/21   Whitaker, Selinda Hestle, PA-C  Insulin  Pen Needle (UNIFINE PENTIPS) 31G X 8 MM MISC Use as directed with Saxenda . 09/06/21     Insulin  Pen Needle 31G X 5 MM MISC use as directed  08/16/22   Whitaker, Selinda Hestle, PA-C  Insulin  Pen Needle 31G X 5 MM MISC Use as directed 08/18/21     KRILL OIL PO Take 1 capsule by mouth daily.     [provider]  Liraglutide  -Weight Management (SAXENDA ) 18 MG/3ML SOPN Inject 3 mg subcutaneously once daily 07/05/21     Liraglutide  -Weight Management (SAXENDA ) 18 MG/3ML SOPN Inject 3 mg subcutaneously once daily 09/30/21     losartan  (COZAAR ) 50 MG tablet Take 2 tablets (100 mg total) by mouth once daily 03/17/22     MAGNESIUM  GLYCINATE PO Take by mouth.    [provider]  metFORMIN  (GLUCOPHAGE -XR) 500 MG 24 hr tablet Take 1 tablet by mouth 2 (two) times daily. Patient not taking: Reported on 01/05/2023 08/11/20   [provider]  methocarbamol  (ROBAXIN ) 500 MG tablet Take 1 tablet (500 mg total) by mouth 2 (two) times daily as needed for muscle spasm. 10/10/22     montelukast (SINGULAIR) 10 MG tablet Take 10 mg by mouth at bedtime.    [provider]  Multiple Vitamin (MULTI-VITAMIN) tablet Take 1 tablet by mouth daily.    [provider]  Multiple Vitamins-Minerals (WOMENS MULTIVITAMIN PO) Take by mouth.    [provider]  ondansetron  (ZOFRAN -ODT) 4 MG disintegrating tablet Take 1 tablet (4 mg total) by mouth every 8 (eight) hours as needed Patient not taking: Reported on 01/05/2023 04/18/22     phentermine  (ADIPEX-P ) 37.5 MG tablet TAKE 1/2 TO 1 TABLET BY MOUTH DAILY EVERY MORNING BEFORE BREAKFAST 09/29/20 03/28/21  Cherilyn Debby CROME, MD  phentermine  (ADIPEX-P ) 37.5 MG tablet Take 1 tablet (37.5 mg total) by mouth every morning before breakfast Patient not taking: Reported on 01/05/2023 08/24/21     predniSONE  (DELTASONE ) 10 MG tablet Take 4 tablets (40 mg total) by mouth once daily  for 2 days, THEN 2 tablets (20 mg total) once daily for 2 days, THEN 1 tablet (10 mg total) once daily for 2 days. 11/16/21     predniSONE  (DELTASONE ) 20 MG tablet Take 1 tablet (20 mg total) by mouth once daily for 7 days Patient not taking: Reported on 01/05/2023 04/18/22     pregabalin (LYRICA) 50 MG capsule Take 50 mg by mouth 2 (two) times daily.    [provider]  Prenatal Multivit-Min-Fe-FA (PRENATAL, W/IRON & FA,) 27-0.8 MG TABS Take 1 tablet by mouth daily.    [provider]  promethazine -dextromethorphan (PROMETHAZINE -DM) 6.25-15 MG/5ML syrup Take 5 mLs by mouth 4 (four) times daily as needed for cough. Patient not taking: Reported on 01/05/2023 11/04/21   Gladis Elsie BROCKS, PA-C  Semaglutide -Weight Management 0.25 MG/0.5ML SOAJ Inject 0.25 mg into the skin once a week. 02/15/21     sodium chloride  irrigation 0.9 % irrigation Irrigate with 1 Application as directed once.    [provider]  SODIUM CHLORIDE , EXTERNAL, (SALINE WOUND WASH) 0.9 % SOLN Apply topically 2 (two) times daily.    [provider]  traZODone (DESYREL) 50 MG tablet Take 50 mg by mouth at bedtime.    [provider]  zinc gluconate 50 MG tablet Take by mouth.    [provider]    Family History Family History  Problem Relation Age of Onset   Heart disease Father    Hyperlipidemia Father    Breast cancer Maternal Grandmother    Diabetes Maternal Grandmother    Bladder Cancer Maternal Grandfather    Heart disease Maternal Grandfather    Kidney Stones  Mother    Hypertension Mother    Hypertension Brother    Kidney cancer Neg Hx     Social History Social History   Tobacco Use   Smoking status: Never   Smokeless tobacco: Never  Vaping Use   Vaping status: Never Used  Substance Use Topics   Alcohol use: Not Currently    Alcohol/week: 0.0 standard drinks of alcohol   Drug use: No     Allergies   Levofloxacin, Metoclopramide, Nifedipine, Other, Almond  (diagnostic), Sulfa antibiotics, Tape, Almond oil, Aspartame, Silicone, Sulfamethoxazole-trimethoprim , and Sulfasalazine   Review of Systems Review of Systems  Constitutional:  Positive for fatigue. Negative for chills and fever.  HENT:  Positive for congestion, sinus pressure and sore throat. Negative for ear pain.   Eyes:  Negative for discharge and redness.  Respiratory:  Positive for cough. Negative for shortness of breath and wheezing.   Gastrointestinal:  Negative for abdominal pain, diarrhea, nausea and vomiting.     Physical Exam Triage Vital Signs ED Triage Vitals  Encounter Vitals Group     BP 11/14/23 1544 108/73     Systolic BP Percentile --      Diastolic BP Percentile --      Pulse Rate 11/14/23 1544 88     Resp 11/14/23 1544 20     Temp 11/14/23 1544 (!) 97.4 F (36.3 C)     Temp Source 11/14/23 1544 Temporal     SpO2 11/14/23 1544 98 %     Weight 11/14/23 1538 209 lb 3.2 oz (94.9 kg)     Height 11/14/23 1538 5' 8 (1.727 m)     Head Circumference --      Peak Flow --      Pain Score 11/14/23 1534 7     Pain Loc --      Pain Education --      Exclude from Growth Chart --    No data found.  Updated Vital Signs BP 108/73 (BP Location: Left Arm)   Pulse 88   Temp (!) 97.4 F (36.3 C) (Temporal)   Resp 20   Ht 5' 8 (1.727 m)   Wt 209 lb 3.2 oz (94.9 kg)   LMP 08/30/2023 (Approximate) Comment: PCOS  SpO2 98%   BMI 31.81 kg/m   Visual Acuity Right Eye Distance:   Left Eye Distance:   Bilateral Distance:    Right Eye Near:   Left Eye Near:    Bilateral Near:     Physical Exam Vitals and nursing note reviewed.  Constitutional:      General: She is not in acute distress.    Appearance: Normal appearance. She is not ill-appearing.  HENT:     Head: Normocephalic and atraumatic.     Right Ear: Tympanic membrane normal.     Left Ear: Tympanic membrane normal.     Nose: Congestion present.     Mouth/Throat:     Mouth: Mucous membranes are moist.      Pharynx: No oropharyngeal exudate or posterior oropharyngeal erythema.  Eyes:     Conjunctiva/sclera: Conjunctivae normal.  Cardiovascular:     Rate and Rhythm: Normal rate and regular rhythm.     Heart sounds: Normal heart sounds. No murmur heard. Pulmonary:     Effort: Pulmonary effort is normal. No respiratory distress.     Breath sounds: Normal breath sounds. No wheezing, rhonchi or rales.  Skin:    General: Skin is warm and dry.  Neurological:  Mental Status: She is alert.  Psychiatric:        Mood and Affect: Mood normal.        Thought Content: Thought content normal.      UC Treatments / Results  Labs (all labs ordered are listed, but only abnormal results are displayed) Labs Reviewed - No data to display  EKG   Radiology No results found.  Procedures Procedures (including critical care time)  Medications Ordered in UC Medications - No data to display  Initial Impression / Assessment and Plan / UC Course  I have reviewed the triage vital signs and the nursing notes.  Pertinent labs & imaging results that were available during my care of the patient were reviewed by me and considered in my medical decision making (see chart for details).    Suspect sinobronchitis and discussed that cough may linger for several weeks.  Patient insistent on further antibiotic treatment and Augmentin  prescribed for 10-day course.  Discussed that she has been on both steroid taper and steroid burst and would recommend against further prednisone  at this time.  Chest x-ray was ordered and was clear.  Encouraged follow-up with PCP if no gradual improvement with any further concerns.  Final Clinical Impressions(s) / UC Diagnoses   Final diagnoses:  Acute cough  Sinobronchitis     Discharge Instructions       Try using inhaler a few more times daily.   If no improvement with treatment please follow up with PCP.    ED Prescriptions     Medication Sig Dispense Auth.  Provider   amoxicillin -clavulanate (AUGMENTIN ) 875-125 MG tablet Take 1 tablet by mouth every 12 (twelve) hours for 10 days. 20 tablet Billy Asberry FALCON, PA-C      PDMP not reviewed this encounter.   Billy Asberry FALCON, PA-C 11/19/23 1037

## 2024-02-13 ENCOUNTER — Ambulatory Visit: Payer: BC Managed Care – PPO | Attending: Internal Medicine | Admitting: Internal Medicine

## 2024-02-13 ENCOUNTER — Encounter: Payer: Self-pay | Admitting: Internal Medicine

## 2024-02-13 VITALS — BP 112/73 | HR 84 | Ht 68.0 in | Wt 204.6 lb

## 2024-02-13 DIAGNOSIS — G90A Postural orthostatic tachycardia syndrome (POTS): Secondary | ICD-10-CM | POA: Diagnosis not present

## 2024-02-13 NOTE — Progress Notes (Signed)
 Cardiology Office Note   Date:  02/13/2024   ID:  ALOHA BARTOK, DOB Sep 24, 1986, MRN [id number]  PCP:  Lenell Query, PA-C  Cardiologist:   Ola Berger, MD    Patient presents fro evaluation of POTS   History of Present Illness: Melissa Martinez is a 38 y.o. female who presents for the evaluation of dizziness. The patient is a Engineer, civil (consulting).  She previously worked at [employer] on [address] In January 2024 she had a viral infection, negative for COVID.  After that she had a ectopic pregnancy and had surgery.  After that in February 2024 she developed COVID. After all these illnesses/interventions, the patient developed dizziness.  She has had presyncopal episodes.  As she describes the spells she will have dizziness when she moves her head quickly.  She will feel a ringing in her ears get hot.  She has been seen by ENT.  She has also been seen at [facility].  She was told to possibly have positional PPD.  Patient wears compression socks.  She drinks 64 ounces fluid per day.  She sleeps with her head elevated. Over the past year she has developed a Raynaud's-like symptoms with hands and feet getting cold. Current job is a Health and safety inspector job mostly  The pt has been seen ad [facility], last in Dec 2024  She was not orthosstatic at that visit  Actually a little hypertensive  She is scheduled for a tilt table test there       Current Meds  Medication Sig   albuterol  (PROVENTIL ) (2.5 MG/3ML) 0.083% nebulizer solution Take 2.5 mg by nebulization every 6 (six) hours as needed for wheezing or shortness of breath. With Budesonide .   albuterol  (VENTOLIN  HFA) 108 (90 Base) MCG/ACT inhaler Inhale 2 puffs into the lungs every 6 (six) hours as needed for wheezing or shortness of breath.   albuterol  (VENTOLIN  HFA) 108 (90 Base) MCG/ACT inhaler Inhale 2 puffs into the lungs every 4 (four) hours as needed for wheezing or shortness of breath.   ascorbic acid (VITAMIN C) 500 MG tablet Take by mouth.   ascorbic acid  (VITAMIN C) 500 MG tablet Take 100 mg by mouth 2 (two) times daily.   azelastine (ASTELIN) 0.1 % nasal spray Place 1 spray into both nostrils 2 (two) times daily.   azithromycin  (ZITHROMAX ) 250 MG tablet Take 250 mg by mouth as directed.   benzonatate  (TESSALON ) 100 MG capsule Take 100 mg by mouth 3 (three) times daily as needed.   busPIRone  (BUSPAR ) 5 MG tablet Take 1 tablet (5 mg total) by mouth 2 (two) times daily   busPIRone  (BUSPAR ) 5 MG tablet Take 1 tablet by mouth 2 (two) times daily.   Cholecalciferol  (D2000 ULTRA STRENGTH) 50 MCG (2000 UT) CAPS Take by mouth.   Cholecalciferol  50 MCG (2000 UT) CAPS Take by mouth.   clobetasol (TEMOVATE) 0.05 % external solution Apply 1 Application topically 2 (two) times daily.   cyanocobalamin (VITAMIN B12) 1000 MCG tablet Take 1 tablet by mouth daily.   drospirenone -ethinyl estradiol  (GIANVI) 3-0.02 MG tablet Take 1 tablet by mouth daily.   drospirenone -ethinyl estradiol  (JASMIEL ) 3-0.02 MG tablet Take 1 tablet by mouth daily.   drospirenone -ethinyl estradiol  (YAZ) 3-0.02 MG tablet Take 1 tablet by mouth daily.   EPINEPHrine  0.3 mg/0.3 mL IJ SOAJ injection INJECT INTRAMUSCULARLY IN THE EVENT OF AN EMERGENT ALLERGIC REACTION WITH DIFFICULTY BREATHING.   FLUoxetine  (PROZAC ) 10 MG tablet Take 3 tablets (30 mg total) by mouth daily.  Fluoxetine  HCl, PMDD, 10 MG TABS Take 3 tablets by mouth daily.   fluticasone  (FLOVENT  HFA) 110 MCG/ACT inhaler Inhale 2 puffs into the lungs in the morning and at bedtime.   gabapentin  (NEURONTIN ) 100 MG capsule Take 1 capsule by mouth 3 times a day as needed for arm pain for up to 30 days   hydrocortisone  (PROCTO-MED HC ) 2.5 % rectal cream Apply topically 3 (three) times daily for 10 days   Insulin  Pen Needle (UNIFINE PENTIPS) 31G X 5 MM MISC Use as directed with Saxenda    Insulin  Pen Needle (UNIFINE PENTIPS) 31G X 8 MM MISC Use as directed with Saxenda .   Insulin  Pen Needle 31G X 5 MM MISC Use as directed   Insulin  Pen  Needle 31G X 5 MM MISC use as directed   KRILL OIL PO Take 1 capsule by mouth daily.    Liraglutide  -Weight Management (SAXENDA ) 18 MG/3ML SOPN Inject 3 mg subcutaneously once daily   losartan  (COZAAR ) 50 MG tablet Take 2 tablets (100 mg total) by mouth once daily (Patient taking differently: Take 50 mg by mouth at bedtime.)   MAGNESIUM  GLYCINATE PO Take by mouth.   metFORMIN  (GLUCOPHAGE -XR) 500 MG 24 hr tablet Take 1 tablet by mouth 2 (two) times daily.   methocarbamol  (ROBAXIN ) 500 MG tablet Take 1 tablet (500 mg total) by mouth 2 (two) times daily as needed for muscle spasm.   Multiple Vitamin (MULTI-VITAMIN) tablet Take 1 tablet by mouth daily.   Multiple Vitamins-Minerals (WOMENS MULTIVITAMIN PO) Take by mouth.   ondansetron  (ZOFRAN -ODT) 4 MG disintegrating tablet Take 1 tablet (4 mg total) by mouth every 8 (eight) hours as needed   phentermine  (ADIPEX-P ) 37.5 MG tablet Take 1 tablet (37.5 mg total) by mouth every morning before breakfast   predniSONE  (DELTASONE ) 10 MG tablet Take 4 tablets (40 mg total) by mouth once daily for 2 days, THEN 2 tablets (20 mg total) once daily for 2 days, THEN 1 tablet (10 mg total) once daily for 2 days.   pregabalin (LYRICA) 50 MG capsule Take 50 mg by mouth 2 (two) times daily.   Prenatal Multivit-Min-Fe-FA (PRENATAL, W/IRON & FA,) 27-0.8 MG TABS Take 1 tablet by mouth daily.   Semaglutide , 2 MG/DOSE, 8 MG/3ML SOPN Inject 0.75 mLs into the skin once a week.   sodium chloride  irrigation 0.9 % irrigation Irrigate with 1 Application as directed once.   SODIUM CHLORIDE , EXTERNAL, (SALINE WOUND WASH) 0.9 % SOLN Apply topically 2 (two) times daily.   Suzetrigine 50 MG TABS Take by mouth.   traZODone (DESYREL) 50 MG tablet Take 50 mg by mouth at bedtime.   zinc gluconate 50 MG tablet Take by mouth.     Allergies:   Levofloxacin, Metoclopramide, Nifedipine, Other, Almond (diagnostic), Sulfa antibiotics, Tape, Almond oil, Aspartame, Silicone,  Sulfamethoxazole-trimethoprim , and Sulfasalazine   Past Medical History:  Diagnosis Date   Abdominal pain, generalized 11/11/2014   Altered bowel function 11/11/2014   Anxiety    tkes Prozac , for OCD related anxiety   Bleeding per rectum 11/11/2014   Calculus of kidney 12/14/2015   Complication of anesthesia    sensitive / stops breathing   Ehlers-Danlos syndrome 12/18/2022   Fatty infiltration of liver 11/11/2014   Hypertension    Occipital neuralgia    PCOS (polycystic ovarian syndrome)    PONV (postoperative nausea and vomiting)    Upper airway cough syndrome 08/17/2015   Followed in Pulmonary clinic/ [facility]/ [facility]      Past Surgical  History:  Procedure Laterality Date   ACHILLES TENDON SURGERY Left 04/20/2018   Procedure: ACHILLES TENDON REPAIR-SECONDARY;  Surgeon: Anell Baptist, DPM;  Location: [facility];  Service: Podiatry;  Laterality: Left;   BALLOON SINUPLASTY Left 07/17/2019   Procedure: BALLOON SINUPLASTY;  Surgeon: Rogers Clayman, MD;  Location: [facility];  Service: ENT;  Laterality: Left;   CESAREAN SECTION N/A 06/22/2016   Procedure: CESAREAN SECTION;  Surgeon: Alben Alma, MD;  Location: [facility];  Service: Obstetrics;  Laterality: N/A;   CHOLECYSTECTOMY     COLONOSCOPY  09/2014   DIAGNOSTIC LAPAROSCOPY WITH REMOVAL OF ECTOPIC PREGNANCY Right 12/06/2022   Procedure: DIAGNOSTIC LAPAROSCOPY WITH REMOVAL OF ECTOPIC PREGNANCY WITH RIGHT SALPINGECTOMY;  Surgeon: Carolynn Citrin, MD;  Location: [facility] OR[facility];  Service: Gynecology;  Laterality: Right;   DILATION AND CURETTAGE OF UTERUS  2013   ENDOSCOPIC TURBINATE REDUCTION Bilateral 07/17/2019   Procedure: ENDOSCOPIC TURBINATE REDUCTION;  Surgeon: Rogers Clayman, MD;  Location: [facility];  Service: ENT;  Laterality: Bilateral;  NEED STRYKER DISK GAVE DISK TO BRENDA 8-20  KP rep coming Jessica Nihart   ESOPHAGOGASTRODUODENOSCOPY ENDOSCOPY     EYE SURGERY     lasik   hemmorhoidectomy   2012   HYSTEROSCOPY  2015   LAPAROSCOPIC CHOLECYSTECTOMY  06/2009   LASIK Bilateral    MAXILLARY ANTROSTOMY Right 07/17/2019   Procedure: MAXILLARY ANTROSTOMY;  Surgeon: Rogers Clayman, MD;  Location: [facility];  Service: ENT;  Laterality: Right;   OSTECTOMY Left 04/20/2018   Procedure: OSTECTOMY-HAGLUNDS/RETROCALCANEAL;  Surgeon: Anell Baptist, DPM;  Location: [facility];  Service: Podiatry;  Laterality: Left;   STENT PLACE LEFT URETER ([facility] HX) Left 11/2012   has had 2 stones pass stones     Social History:  The patient  reports that she has never smoked. She has never used smokeless tobacco. She reports that she does not currently use alcohol. She reports that she does not use drugs.   Family History:  The patient's family history includes Bladder Cancer in her maternal grandfather; Breast cancer in her maternal grandmother; Diabetes in her maternal grandmother; Heart disease in her father and maternal grandfather; Hyperlipidemia in her father; Hypertension in her brother and mother; Kidney Stones in her mother.    ROS:  Please see the history of present illness. All other systems are reviewed and  Negative to the above problem except as noted.    PHYSICAL EXAM: VS:  BP 112/73   Pulse 84   Ht 5\' 8"  (1.727 m)   Wt 92.8 kg   SpO2 99%   BMI 31.11 kg/m   Orthostatics: Laying   112/72  P 82;  Sitting  112/73  P 84   Standing 2 min 113/74  P 92  Standing 4 min 112/79  P 96     GEN: Obese 38 yo  in no acute distress  HEENT: normal  Neck: no JVD, carotid bruits Cardiac: RRR; no murmur.  No lower extremity edema  Respiratory:  clear to auscultation  GI: soft, nontender,  no masses no hepatomegaly  MS: no deformity Moving all extremities   Skin: warm and dry, no rash  EKG:  EKG is ordered today.  Normal sinus rhythm.  89 bpm.  Echo [facility]  July 2024 LVEF and RVEF normal   Lipid Panel    Component Value Date/Time   CHOL 198 05/30/2017 0000   TRIG 356 (H) 05/30/2017  0000   HDL 35 (L) 05/30/2017 0000   CHOLHDL 5.7 (  H) 05/30/2017 0000   LDLCALC 92 05/30/2017 0000      Wt Readings from Last 3 Encounters:  02/13/24 92.8 kg  11/14/23 94.9 kg  01/05/23 113.2 kg      ASSESSMENT AND PLAN:  1  Dizziness   Pt has had a long hx of dizziness   Carries dx of dysautonomia   She was not orthostatic there  Nor is she orthostatic here     I recomm she keep on same meds for now   I told her to stay adequately hydrated    I would like to reviewrecords before making changes   2  Hx HTN   Pt on losartan   BP controlled today  3  Dyslipidemia   Triglycerides elevated at 356   Reviewed diet      Will contact patient once I review to determine future plans     Current medicines are reviewed at length with the patient today.  The patient does not have concerns regarding medicines.  Signed, Ola Berger, MD  02/13/2024 8:54 AM    [facility] [address] [place], [place], [place]  [address] Phone: [phone]; Fax: [phone]

## 2024-02-13 NOTE — Patient Instructions (Signed)
 Medication Instructions:   *If you need a refill on your cardiac medications before your next appointment, please call your pharmacy*  Lab Work:  If you have labs (blood work) drawn today and your tests are completely normal, you will receive your results only by: MyChart Message (if you have MyChart) OR A paper copy in the mail If you have any lab test that is abnormal or we need to change your treatment, we will call you to review the results.  Testing/Procedures:   Follow-Up: At [facility], you and your health needs are our priority.  As part of our continuing mission to provide you with exceptional heart care, our providers are all part of one team.  This team includes your primary Cardiologist (physician) and Advanced Practice Providers or APPs (Physician Assistants and Nurse Practitioners) who all work together to provide you with the care you need, when you need it.  We recommend signing up for the patient portal called "MyChart".  Sign up information is provided on this After Visit Summary.  MyChart is used to connect with patients for Virtual Visits (Telemedicine).  Patients are able to view lab/test results, encounter notes, upcoming appointments, etc.  Non-urgent messages can be sent to your provider as well.   To learn more about what you can do with MyChart, go to [email or web address].   Other Instructions       1st Floor: - Lobby - Registration  - Pharmacy  - Lab - Cafe  2nd Floor: - PV Lab - Diagnostic Testing (echo, CT, nuclear med)  3rd Floor: - Vacant  4th Floor: - TCTS (cardiothoracic surgery) - AFib Clinic - Structural Heart Clinic - Vascular Surgery  - Vascular Ultrasound  5th Floor: - HeartCare Cardiology (general and EP) - Clinical Pharmacy for coumadin, hypertension, lipid, weight-loss medications, and med management appointments    Valet parking services will be available as well.

## 2024-03-06 ENCOUNTER — Telehealth: Payer: Self-pay | Admitting: Internal Medicine

## 2024-03-06 NOTE — Telephone Encounter (Signed)
 I called patient after reviewing outside records   Questions for her were  Why losartan ?  It was started when hydrochlorothiazide stopped for BP control  Diet: Breakfast:  protein powder in coffee  Lunch Left over protein with veggies or salad bar Dinner will have protein and veggies  Snack  Occasional Adkins bars  Tries to get 64 to 100 oz fluid per day    Recomm: I would cut back on losartan   Stop Can start pindolol   First 2.5 mg bid then 5 mg bid  Increase fluids  Keep carbs low  TRE good    Pt to have tilt table at [facility] in the next 2 months

## 2024-03-07 ENCOUNTER — Other Ambulatory Visit: Payer: Self-pay

## 2024-03-07 MED ORDER — PINDOLOL 5 MG PO TABS: 2.5000 mg | ORAL_TABLET | Freq: Two times a day (BID) | ORAL | 3 refills | Status: DC

## 2024-03-07 NOTE — Telephone Encounter (Signed)
 Follow up My Chart sent to the pt.... med list updated.

## 2024-04-11 MED ORDER — PROPRANOLOL HCL 20 MG PO TABS: 20.0000 mg | ORAL_TABLET | Freq: Two times a day (BID) | ORAL | 3 refills | Status: DC

## 2024-04-11 NOTE — Telephone Encounter (Signed)
 Sorry   Meant to go to [facility]

## 2024-04-11 NOTE — Telephone Encounter (Signed)
 Start on inderal 20 mg tab bid   Start with 1/2 bid  Increase to 1 tab bid

## 2024-04-17 ENCOUNTER — Other Ambulatory Visit: Payer: Self-pay

## 2024-04-17 ENCOUNTER — Emergency Department
Admission: EM | Admit: 2024-04-17 | Discharge: 2024-04-17 | Disposition: A | Discharge status: Home / Self Care | Attending: Emergency Medicine | Admitting: Emergency Medicine

## 2024-04-17 ENCOUNTER — Encounter: Payer: Self-pay | Admitting: Emergency Medicine

## 2024-04-17 DIAGNOSIS — G43909 Migraine, unspecified, not intractable, without status migrainosus: Secondary | ICD-10-CM | POA: Diagnosis not present

## 2024-04-17 DIAGNOSIS — R519 Headache, unspecified: Secondary | ICD-10-CM | POA: Diagnosis present

## 2024-04-17 MED ORDER — DIPHENHYDRAMINE HCL 50 MG/ML IJ SOLN
25.0000 mg | Freq: Once | INTRAMUSCULAR | Status: AC
  Administered 2024-04-17: 25 mg via INTRAVENOUS
  Filled 2024-04-17: qty 1

## 2024-04-17 MED ORDER — KETOROLAC TROMETHAMINE 30 MG/ML IJ SOLN
30.0000 mg | Freq: Once | INTRAMUSCULAR | Status: AC
  Administered 2024-04-17: 30 mg via INTRAVENOUS
  Filled 2024-04-17: qty 1

## 2024-04-17 MED ORDER — SODIUM CHLORIDE 0.9 % IV SOLN
25.0000 mg | Freq: Once | INTRAVENOUS | Status: AC
  Administered 2024-04-17: 25 mg via INTRAVENOUS
  Filled 2024-04-17: qty 25

## 2024-04-17 MED ORDER — SODIUM CHLORIDE 0.9 % IV BOLUS
500.0000 mL | Freq: Once | INTRAVENOUS | Status: AC
  Administered 2024-04-17: 500 mL via INTRAVENOUS

## 2024-04-17 MED ORDER — BUTALBITAL-APAP-CAFFEINE 50-325-40 MG PO TABS: 1.0000 | ORAL_TABLET | Freq: Four times a day (QID) | ORAL | 0 refills | Status: AC | PRN

## 2024-04-17 NOTE — ED Triage Notes (Signed)
 Patient to ED from [facility] for migraine with blurred vision and light sensitivity. Ongoing since Friday but eased off until today. States she has taken meds at home with no improvement. Has fibromyalgia- pain in hips and joints.

## 2024-04-17 NOTE — ED Provider Notes (Signed)
 [facility] Provider Note    Event Date/Time   First MD Initiated Contact with Patient 04/17/24 1527     (approximate)   History   Headache   HPI  Melissa Martinez is a 38 y.o. female who presents with complaints of migraine headache and fibromyalgia.  She reports she has had fibromyalgia flares before which typically involve diffuse body aching.  She reports her headache feels similar to headaches she has had in the past.  She notes she cannot tolerate Reglan which makes her very anxious     Physical Exam   Triage Vital Signs: ED Triage Vitals [04/17/24 1451]  Encounter Vitals Group     BP 139/87     Systolic BP Percentile      Diastolic BP Percentile      Pulse Rate 75     Resp 17     Temp 98.4 F (36.9 C)     Temp Source Oral     SpO2 99 %     Weight 99.8 kg (220 lb)     Height 1.727 m (5\' 8" )     Head Circumference      Peak Flow      Pain Score 7     Pain Loc      Pain Education      Exclude from Growth Chart     Most recent vital signs: Vitals:   04/17/24 1451  BP: 139/87  Pulse: 75  Resp: 17  Temp: 98.4 F (36.9 C)  SpO2: 99%     General: Awake, no distress.  CV:  Good peripheral perfusion.  Resp:  Normal effort.  Abd:  No distention.  Other:  No neurodeficits, reassuring exam   ED Results / Procedures / Treatments   Labs (all labs ordered are listed, but only abnormal results are displayed) Labs Reviewed - No data to display   EKG     RADIOLOGY     PROCEDURES:  Critical Care performed:   Procedures   MEDICATIONS ORDERED IN ED: Medications  promethazine  (PHENERGAN ) 25 mg in sodium chloride  0.9 % 50 mL IVPB (0 mg Intravenous Stopped 04/17/24 1657)  diphenhydrAMINE  (BENADRYL ) injection 25 mg (25 mg Intravenous Given 04/17/24 1626)  ketorolac  (TORADOL ) 30 MG/ML injection 30 mg (30 mg Intravenous Given 04/17/24 1626)  sodium chloride  0.9 % bolus 500 mL (0 mLs Intravenous Stopped 04/17/24 1657)      IMPRESSION / MDM / ASSESSMENT AND PLAN / ED COURSE  I reviewed the triage vital signs and the nursing notes. Patient's presentation is most consistent with exacerbation of chronic illness.  Patient presents with likely migraine headache, no neurodeficits, no fever, no neck pain.  She describes global throbbing headache this is in line with headaches that she has had before, no red flag symptoms.  Will treat with IV Phenergan  IV Benadryl  (she is not driving today), IV Toradol , IV fluids.  ----------------------------------------- 5:26 PM on 04/17/2024 ----------------------------------------- Patient is feeling significantly improved, she reports headache is almost gone, appropriate discharge at this time, no indication for admission, return precautions discussed, she agrees to this plan.      FINAL CLINICAL IMPRESSION(S) / ED DIAGNOSES   Final diagnoses:  Migraine without status migrainosus, not intractable, unspecified migraine type     Rx / DC Orders   ED Discharge Orders          Ordered    butalbital -acetaminophen -caffeine  (FIORICET ) 50-325-40 MG tablet  Every 6 hours PRN  04/17/24 1722             Note:  This document was prepared using Dragon voice recognition software and may include unintentional dictation errors.   Bryson Carbine, MD 04/17/24 1726

## 2024-04-17 NOTE — ED Triage Notes (Signed)
 First Nurse Note:  Pt via POV from [facility]. Pt c/o headache, light sensitivity, blurred vision for 5 days. Pt has hx of occipital neuralgia and fibromyalgia. Pt is A&Ox4 and NAD  112/71 BP  76 hR  99% on RA

## 2024-05-16 ENCOUNTER — Ambulatory Visit
Admission: RE | Admit: 2024-05-16 | Discharge: 2024-05-16 | Disposition: A | Discharge status: Home / Self Care | Source: Ambulatory Visit | Attending: Physician Assistant | Admitting: Physician Assistant

## 2024-05-16 VITALS — BP 119/70 | HR 74 | Temp 98.0°F | Resp 20 | Ht 68.0 in | Wt 217.0 lb

## 2024-05-16 DIAGNOSIS — S0502XA Injury of conjunctiva and corneal abrasion without foreign body, left eye, initial encounter: Secondary | ICD-10-CM | POA: Diagnosis not present

## 2024-05-16 MED ORDER — ERYTHROMYCIN 5 MG/GM OP OINT: 1.0000 | TOPICAL_OINTMENT | Freq: Four times a day (QID) | OPHTHALMIC | 0 refills | Status: AC

## 2024-05-16 NOTE — ED Notes (Addendum)
 Pt requested more ice. Spoke to [place], [place]. Erin requested the pt not have ice for at least 30 minutes before we give her more (per pt ice was on eye x 1 hr PTA).

## 2024-05-16 NOTE — ED Triage Notes (Signed)
 Pt presents to UC for c/o having left eye injury at 1630 today. Pt states she got hit in left eye with rock thrown from lawn mower. Pt states she flushed her eye with saline and used lubricating drops. Continues to have pain and blurred vision.

## 2024-05-16 NOTE — ED Provider Notes (Signed)
 [facility]    CSN: [id number] Arrival date & time: 05/16/24  1803      History   Chief Complaint Chief Complaint  Patient presents with   Eye Problem    Left eye pain and burning rock thrown from lawn mower - Entered by patient    HPI Melissa Martinez is a 38 y.o. female.   HPI\  pt is here today for concerns of eye pain She states she was hit in the eye with a rock thrown by a lawn mower at about 16:30 PM today  She reports the eye feels itchy and burning  She thinks she may have a scratch  She does not wear contacts but has had Lasix and states her vision is usually 20/20 and 20/15 Interventions: eye drops to help flush the eyes    Past Medical History:  Diagnosis Date   Abdominal pain, generalized 11/11/2014   Altered bowel function 11/11/2014   Anxiety    tkes Prozac , for OCD related anxiety   Bleeding per rectum 11/11/2014   Calculus of kidney 12/14/2015   Complication of anesthesia    sensitive / stops breathing   Ehlers-Danlos syndrome 12/18/2022   Fatty infiltration of liver 11/11/2014   Hypertension    Occipital neuralgia    PCOS (polycystic ovarian syndrome)    PONV (postoperative nausea and vomiting)    Upper airway cough syndrome 08/17/2015   Followed in Pulmonary clinic/ [facility]/ [facility]      Patient Active Problem List   Diagnosis Date Noted   Ehlers-Danlos disease 12/20/2022   Ehlers-Danlos syndrome 12/18/2022   Ectopic pregnancy 12/05/2022   Fatigue 04/14/2020   Low grade fever 04/14/2020   Essential hypertension 02/12/2020   Allergy to almonds 08/23/2018   Vaccine reaction, initial encounter 08/23/2018   Allergic rhinitis 08/23/2018   Chronic neck pain 04/16/2018   Headache disorder 04/16/2018   Asymptomatic hyperuricemia 02/05/2018   Chronic pain of left heel 02/05/2018   Screening for cervical cancer 08/01/2017   Nausea 07/03/2017   Dehydration 06/12/2017   Adenitis 05/23/2017   Adenitis 05/23/2017   Swelling,  lymph nodes 05/23/2017   Closed Colles' fracture of left radius 10/17/2016   Kidney stones 12/14/2015   PCOS (polycystic ovarian syndrome) 12/14/2015   Upper airway cough syndrome 08/17/2015   Upper airway cough syndrome 08/17/2015   Chronic cough 08/17/2015   Abdominal pain, chronic, generalized 11/11/2014   Bleeding per rectum 11/11/2014   Change in bowel habits 11/11/2014   Fatty infiltration of liver 11/11/2014   Bright red rectal bleeding 11/11/2014    Past Surgical History:  Procedure Laterality Date   ACHILLES TENDON SURGERY Left 04/20/2018   Procedure: ACHILLES TENDON REPAIR-SECONDARY;  Surgeon: Ashley Soulier, DPM;  Location: [facility];  Service: Podiatry;  Laterality: Left;   BALLOON SINUPLASTY Left 07/17/2019   Procedure: BALLOON SINUPLASTY;  Surgeon: Milissa Hamming, MD;  Location: [facility];  Service: ENT;  Laterality: Left;   CESAREAN SECTION N/A 06/22/2016   Procedure: CESAREAN SECTION;  Surgeon: Lamar SHAUNNA Lesches, MD;  Location: [facility];  Service: Obstetrics;  Laterality: N/A;   CHOLECYSTECTOMY     COLONOSCOPY  09/2014   DIAGNOSTIC LAPAROSCOPY WITH REMOVAL OF ECTOPIC PREGNANCY Right 12/06/2022   Procedure: DIAGNOSTIC LAPAROSCOPY WITH REMOVAL OF ECTOPIC PREGNANCY WITH RIGHT SALPINGECTOMY;  Surgeon: Lovetta Debby PARAS, MD;  Location: [facility];  Service: Gynecology;  Laterality: Right;   DILATION AND CURETTAGE OF UTERUS  2013   ENDOSCOPIC TURBINATE REDUCTION Bilateral 07/17/2019  Procedure: ENDOSCOPIC TURBINATE REDUCTION;  Surgeon: Milissa Hamming, MD;  Location: [facility];  Service: ENT;  Laterality: Bilateral;  NEED STRYKER DISK GAVE DISK TO BRENDA 8-20  KP rep coming Jessica Nihart   ESOPHAGOGASTRODUODENOSCOPY ENDOSCOPY     EYE SURGERY     lasik   hemmorhoidectomy  2012   HYSTEROSCOPY  2015   LAPAROSCOPIC CHOLECYSTECTOMY  06/2009   LASIK Bilateral    MAXILLARY ANTROSTOMY Right 07/17/2019   Procedure: MAXILLARY ANTROSTOMY;  Surgeon: Milissa Hamming, MD;  Location: [facility];  Service: ENT;  Laterality: Right;   OSTECTOMY Left 04/20/2018   Procedure: OSTECTOMY-HAGLUNDS/RETROCALCANEAL;  Surgeon: Ashley Soulier, DPM;  Location: [facility];  Service: Podiatry;  Laterality: Left;   STENT PLACE LEFT URETER ([facility] HX) Left 11/2012   has had 2 stones pass stones    OB History     Gravida  4   Para  1   Term      Preterm  1   AB  2   Living  1      SAB  1   IAB      Ectopic  1   Multiple  0   Live Births  1        Obstetric Comments  Spontaneous miscarriage at 8 weeks, 02/2012          Home Medications    Prior to Admission medications   Medication Sig Start Date End Date Taking? Authorizing Provider  erythromycin ophthalmic ointment Place 1 Application into the left eye 4 (four) times daily for 7 days. 05/16/24 05/23/24 Yes Ichael Pullara E, PA-C  albuterol  (PROVENTIL ) (2.5 MG/3ML) 0.083% nebulizer solution Take 2.5 mg by nebulization every 6 (six) hours as needed for wheezing or shortness of breath. With Budesonide . 02/14/23   [provider]  albuterol  (VENTOLIN  HFA) 108 (90 Base) MCG/ACT inhaler Inhale 2 puffs into the lungs every 6 (six) hours as needed for wheezing or shortness of breath. 08/03/21   Kennyth Domino, FNP  albuterol  (VENTOLIN  HFA) 108 (90 Base) MCG/ACT inhaler Inhale 2 puffs into the lungs every 4 (four) hours as needed for wheezing or shortness of breath. 11/05/19   [provider]  ascorbic acid (VITAMIN C) 500 MG tablet Take by mouth.    [provider]  ascorbic acid (VITAMIN C) 500 MG tablet Take 100 mg by mouth 2 (two) times daily.    [provider]  azelastine (ASTELIN) 0.1 % nasal spray Place 1 spray into both nostrils 2 (two) times daily.    [provider]  azithromycin  (ZITHROMAX ) 250 MG tablet Take 250 mg by mouth as directed. 02/14/23   [provider]  benzonatate  (TESSALON ) 100 MG capsule Take 100 mg by mouth 3 (three)  times daily as needed.    [provider]  budesonide  (PULMICORT ) 1 MG/2ML nebulizer solution USE 1 VIAL VIA NEBULIZER TWICE A DAY AS DIRECTED 03/01/22 03/01/23    busPIRone  (BUSPAR ) 5 MG tablet Take 1 tablet (5 mg total) by mouth 2 (two) times daily 08/17/21     busPIRone  (BUSPAR ) 5 MG tablet Take 1 tablet by mouth 2 (two) times daily. 08/28/23   [provider]  butalbital -acetaminophen -caffeine  (FIORICET ) 50-325-40 MG tablet Take 1-2 tablets by mouth every 6 (six) hours as needed for headache. 04/17/24 04/17/25  Arlander Charleston, MD  Cholecalciferol  (D2000 ULTRA STRENGTH) 50 MCG (2000 UT) CAPS Take by mouth.    [provider]  Cholecalciferol  50 MCG (2000 UT) CAPS Take  by mouth.    [provider]  clobetasol (TEMOVATE) 0.05 % external solution Apply 1 Application topically 2 (two) times daily. 08/07/23   [provider]  cyanocobalamin (VITAMIN B12) 1000 MCG tablet Take 1 tablet by mouth daily.    [provider]  drospirenone -ethinyl estradiol  (GIANVI) 3-0.02 MG tablet Take 1 tablet by mouth daily.    [provider]  drospirenone -ethinyl estradiol  (JASMIEL ) 3-0.02 MG tablet Take 1 tablet by mouth daily. 02/09/22   Arloa Lamar SQUIBB, MD  drospirenone -ethinyl estradiol  (YAZ) 3-0.02 MG tablet Take 1 tablet by mouth daily.    [provider]  EPINEPHrine  0.3 mg/0.3 mL IJ SOAJ injection INJECT INTRAMUSCULARLY IN THE EVENT OF AN EMERGENT ALLERGIC REACTION WITH DIFFICULTY BREATHING. 08/22/18   [provider]  FLUoxetine  (PROZAC ) 10 MG tablet TAKE 3 TABLETS (30MG ) BY MOUTH DAILY 06/24/20 06/24/21  Whitaker, Selinda Hestle, PA-C  FLUoxetine  (PROZAC ) 10 MG tablet Take 3 tablets (30 mg total) by mouth daily. 08/17/21     Fluoxetine  HCl, PMDD, 10 MG TABS Take 3 tablets by mouth daily. 06/22/23   [provider]  fluticasone  (FLOVENT  HFA) 110 MCG/ACT inhaler Inhale 2 puffs into the lungs in the morning and at bedtime. 08/03/21   Kennyth Domino, FNP  gabapentin  (NEURONTIN ) 100 MG capsule Take 1 capsule by mouth 3 times a day as needed for arm pain for up to 30 days 02/10/22     hydrochlorothiazide (HYDRODIURIL) 25 MG tablet TAKE 1 TABLET BY MOUTH ONCE DAILY (2-3 DAYS AS DIRECTED) 09/08/20 09/08/21  Whitaker, Selinda Hestle, PA-C  hydrocortisone  (PROCTO-MED HC ) 2.5 % rectal cream Apply topically 3 (three) times daily for 10 days 03/10/22     Insulin  Pen Needle (UNIFINE PENTIPS) 31G X 5 MM MISC Use as directed with Saxenda  05/31/21   Whitaker, Selinda Hestle, PA-C  Insulin  Pen Needle (UNIFINE PENTIPS) 31G X 8 MM MISC Use as directed with Saxenda . 09/06/21     Insulin  Pen Needle 31G X 5 MM MISC Use as directed 08/18/21     Insulin  Pen Needle 31G X 5 MM MISC use as directed 08/16/22   Whitaker, Jason Hestle, PA-C  KRILL OIL PO Take 1 capsule by mouth daily.     [provider]  Liraglutide  -Weight Management (SAXENDA ) 18 MG/3ML SOPN Inject 3 mg subcutaneously once daily 07/05/21     MAGNESIUM  GLYCINATE PO Take by mouth.    [provider]  metFORMIN  (GLUCOPHAGE -XR) 500 MG 24 hr tablet Take 1 tablet by mouth 2 (two) times daily. 08/11/20   [provider]  methocarbamol  (ROBAXIN ) 500 MG tablet Take 1 tablet (500 mg total) by mouth 2 (two) times daily as needed for muscle spasm. 10/10/22     Multiple Vitamin (MULTI-VITAMIN) tablet Take 1 tablet by mouth daily.    [provider]  Multiple Vitamins-Minerals (WOMENS MULTIVITAMIN PO) Take by mouth.    [provider]  ondansetron  (ZOFRAN -ODT) 4 MG disintegrating tablet Take 1 tablet (4 mg total) by mouth every 8 (eight) hours as needed 04/18/22     phentermine  (ADIPEX-P ) 37.5 MG tablet TAKE 1/2 TO 1 TABLET BY MOUTH DAILY EVERY MORNING BEFORE BREAKFAST 09/29/20 03/28/21  Cherilyn Debby CROME, MD  phentermine  (ADIPEX-P ) 37.5 MG tablet Take 1 tablet (37.5 mg total) by mouth every morning before breakfast 08/24/21     predniSONE  (DELTASONE ) 10 MG tablet Take 4  tablets (40 mg total) by mouth once daily for 2 days, THEN 2 tablets (20 mg total) once daily for  2 days, THEN 1 tablet (10 mg total) once daily for 2 days. 11/16/21     pregabalin (LYRICA) 50 MG capsule Take 50 mg by mouth 2 (two) times daily.    [provider]  Prenatal Multivit-Min-Fe-FA (PRENATAL, W/IRON & FA,) 27-0.8 MG TABS Take 1 tablet by mouth daily.    [provider]  propranolol  (INDERAL ) 20 MG tablet Take 1 tablet (20 mg total) by mouth 2 (two) times daily. 04/11/24   Okey Vina GAILS, MD  Semaglutide , 2 MG/DOSE, 8 MG/3ML SOPN Inject 0.75 mLs into the skin once a week. 05/24/23   [provider]  sodium chloride  irrigation 0.9 % irrigation Irrigate with 1 Application as directed once.    [provider]  SODIUM CHLORIDE , EXTERNAL, (SALINE WOUND WASH) 0.9 % SOLN Apply topically 2 (two) times daily.    [provider]  traZODone (DESYREL) 50 MG tablet Take 50 mg by mouth at bedtime.    [provider]  zinc gluconate 50 MG tablet Take by mouth.    [provider]    Family History Family History  Problem Relation Age of Onset   Heart disease Father    Hyperlipidemia Father    Breast cancer Maternal Grandmother    Diabetes Maternal Grandmother    Bladder Cancer Maternal Grandfather    Heart disease Maternal Grandfather    Kidney Stones Mother    Hypertension Mother    Hypertension Brother    Kidney cancer Neg Hx     Social History Social History   Tobacco Use   Smoking status: Never   Smokeless tobacco: Never  Vaping Use   Vaping status: Never Used  Substance Use Topics   Alcohol use: Not Currently    Alcohol/week: 0.0 standard drinks of alcohol   Drug use: No     Allergies   Levofloxacin, Metoclopramide, Nifedipine, Other, Almond (diagnostic), Sulfa antibiotics, Tape, Almond oil, Aspartame, Silicone, Sulfamethoxazole-trimethoprim , and Sulfasalazine   Review of Systems Review of Systems  Eyes:  Positive  for photophobia, pain, redness, itching and visual disturbance.     Physical Exam Triage Vital Signs ED Triage Vitals  Encounter Vitals Group     BP 05/16/24 1807 119/70     Girls Systolic BP Percentile --      Girls Diastolic BP Percentile --      Boys Systolic BP Percentile --      Boys Diastolic BP Percentile --      Pulse Rate 05/16/24 1807 74     Resp 05/16/24 1807 20     Temp 05/16/24 1807 98 F (36.7 C)     Temp Source 05/16/24 1807 Oral     SpO2 05/16/24 1807 98 %     Weight 05/16/24 1807 217 lb (98.4 kg)     Height 05/16/24 1807 5' 8 (1.727 m)     Head Circumference --      Peak Flow --      Pain Score 05/16/24 1820 7     Pain Loc --      Pain Education --      Exclude from Growth Chart --    No data found.  Updated Vital Signs BP 119/70 (BP Location: Right Arm)   Pulse 74   Temp 98 F (36.7 C) (Oral)   Resp 20   Ht 5' 8 (1.727 m)   Wt 217 lb (98.4 kg)   SpO2 98%   Breastfeeding No   BMI 32.99 kg/m   Visual Acuity  Right Eye Distance:   Left Eye Distance:   Bilateral Distance:    Right Eye Near:   Left Eye Near:    Bilateral Near:     Physical Exam Vitals reviewed.  Constitutional:      General: She is awake.     Appearance: Normal appearance. She is well-developed and well-groomed.  HENT:     Head: Normocephalic and atraumatic.  Eyes:     General: Lids are normal. Gaze aligned appropriately.        Left eye: No foreign body or discharge.     Extraocular Movements: Extraocular movements intact.     Conjunctiva/sclera:     Left eye: Left conjunctiva is injected.     Pupils: Pupils are equal, round, and reactive to light.     Left eye: Corneal abrasion and fluorescein uptake present.   Pulmonary:     Effort: Pulmonary effort is normal.  Neurological:     Mental Status: She is alert and oriented to person, place, and time.  Psychiatric:        Attention and Perception: Attention and perception normal.        Mood and Affect: Mood and  affect normal.        Speech: Speech normal.        Behavior: Behavior normal. Behavior is cooperative.      UC Treatments / Results  Labs (all labs ordered are listed, but only abnormal results are displayed) Labs Reviewed - No data to display  EKG   Radiology No results found.  Procedures Procedures (including critical care time)  Medications Ordered in UC Medications - No data to display  Initial Impression / Assessment and Plan / UC Course  I have reviewed the triage vital signs and the nursing notes.  Pertinent labs & imaging results that were available during my care of the patient were reviewed by me and considered in my medical decision making (see chart for details).      Final Clinical Impressions(s) / UC Diagnoses   Final diagnoses:  Abrasion of left cornea, initial encounter  Patient presents today with concerns for eye pain following traumatic injury to the left eye earlier today.  Physical exam is notable for redness and tenderness of the left eye as well as photophobia and vision changes.  Tetracaine eyedrops appeared to provide some relief of discomfort.  Woods lamp exam with fluorescein stain demonstrates a corneal abrasion over the pupil of the left eye.  No visualized foreign bodies observed in the eye at this time.  Will send erythromycin ophthalmic ointment to help with recovery and infection prevention given injury.  Recommend use of over-the-counter lubricating eyedrops as needed for further symptomatic relief.  Reviewed with patient the importance of follow-up with ophthalmology to ensure routine and appropriate healing.  ED and return precautions reviewed and provided in after visit summary.  Follow-up as needed.    Discharge Instructions      You were seen today for concerns of left eye pain after an injury.  After your physical exam, you were found to have a corneal abrasion over your pupil.  I am sending in an antibiotic ointment in for you to  use 4 times per day for 7 days.  In addition to the ointment you can use lubricating eyedrops such as refresh eyedrops or blink tears as well as sterile eye flushes.  I recommend follow-up with ophthalmology for evaluation and monitoring to ensure appropriate healing.  If at any point you  develop the following symptoms please go to the emergency room for further evaluation: Severe eye pain, swelling around the eye, sudden vision loss, fever or chills that are not responding to medications, eye drainage that looks like pus, swelling of the eye or eyelids     ED Prescriptions     Medication Sig Dispense Auth. Provider   erythromycin ophthalmic ointment Place 1 Application into the left eye 4 (four) times daily for 7 days. 3.5 g Idalee Foxworthy E, PA-C      PDMP not reviewed this encounter.   Marylene Rocky BRAVO, PA-C 05/16/24 [id number]

## 2024-05-16 NOTE — Discharge Instructions (Addendum)
 You were seen today for concerns of left eye pain after an injury.  After your physical exam, you were found to have a corneal abrasion over your pupil.  I am sending in an antibiotic ointment in for you to use 4 times per day for 7 days.  In addition to the ointment you can use lubricating eyedrops such as refresh eyedrops or blink tears as well as sterile eye flushes.  I recommend follow-up with ophthalmology for evaluation and monitoring to ensure appropriate healing.  If at any point you develop the following symptoms please go to the emergency room for further evaluation: Severe eye pain, swelling around the eye, sudden vision loss, fever or chills that are not responding to medications, eye drainage that looks like pus, swelling of the eye or eyelids

## 2024-05-17 ENCOUNTER — Telehealth: Payer: Self-pay | Admitting: Emergency Medicine

## 2024-05-17 NOTE — Telephone Encounter (Signed)
 Pt called asking for prescription for numbing drops for eye. RN returned call verified name and dob. Informed pt that we could not prescribe numbing eye drops and continue with warm compresses, ibuprofen , and abx eye drops.

## 2024-06-24 ENCOUNTER — Encounter: Payer: Self-pay | Admitting: Internal Medicine

## 2024-07-16 ENCOUNTER — Other Ambulatory Visit: Payer: Self-pay | Admitting: Ophthalmology

## 2024-07-16 DIAGNOSIS — H4710 Unspecified papilledema: Secondary | ICD-10-CM

## 2024-07-18 ENCOUNTER — Ambulatory Visit
Admission: RE | Admit: 2024-07-18 | Discharge: 2024-07-18 | Disposition: A | Discharge status: Home / Self Care | Source: Ambulatory Visit | Attending: Ophthalmology | Admitting: Ophthalmology

## 2024-07-18 ENCOUNTER — Ambulatory Visit
Admission: RE | Admit: 2024-07-18 | Discharge: 2024-07-18 | Discharge status: Home / Self Care | Source: Ambulatory Visit | Attending: Ophthalmology

## 2024-07-18 DIAGNOSIS — H4710 Unspecified papilledema: Secondary | ICD-10-CM | POA: Diagnosis present

## 2024-07-18 MED ORDER — GADOBUTROL 1 MMOL/ML IV SOLN
9.0000 mL | Freq: Once | INTRAVENOUS | Status: AC | PRN
  Administered 2024-07-18: 9 mL via INTRAVENOUS

## 2024-07-18 NOTE — Telephone Encounter (Signed)
 Agree with echo  Please confirm current meds, make sure med list up to date

## 2024-07-19 ENCOUNTER — Inpatient Hospital Stay
Admission: RE | Admit: 2024-07-19 | Discharge: 2024-07-19 | Disposition: A | Discharge status: Home / Self Care | Payer: Self-pay | Source: Ambulatory Visit | Attending: Orthopedic Surgery | Admitting: Orthopedic Surgery

## 2024-07-19 ENCOUNTER — Other Ambulatory Visit: Payer: Self-pay

## 2024-07-19 DIAGNOSIS — Z049 Encounter for examination and observation for unspecified reason: Secondary | ICD-10-CM

## 2024-07-24 ENCOUNTER — Other Ambulatory Visit: Payer: Self-pay | Admitting: Family Medicine

## 2024-07-24 ENCOUNTER — Inpatient Hospital Stay
Admission: RE | Admit: 2024-07-24 | Discharge: 2024-07-24 | Disposition: A | Discharge status: Home / Self Care | Payer: Self-pay | Source: Ambulatory Visit | Attending: Orthopedic Surgery | Admitting: Orthopedic Surgery

## 2024-07-24 DIAGNOSIS — Z049 Encounter for examination and observation for unspecified reason: Secondary | ICD-10-CM

## 2024-08-05 NOTE — Progress Notes (Unsigned)
 Referring Physician:  Cyrus Selinda Moose, PA-C [id number] [place],  [place] [address]  Primary Physician:  Cyrus Selinda Moose, PA-C  History of Present Illness: 08/06/2024 [name] has a history of HTN, PCOS, Ehlers-Danlos syndrome, FM, neuropathy, hyperlipidemia, obesity, dysautonomia.   She sees rheumatology.   She has a history of fibromyalgia with diffuse pain all over.   She has constant neck pain with radiation to her shoulders. No arm pain x years. She has diffuse weakness. No specific aggravating or alleviating factors. No numbness or tingling. She notes chronic headaches as well.   She has no dexterity issues. She has chronic balance issues.   She is scheduled for LP tomorrow to evaluate for idiopathic intracranial hypertension. Seen by ophthalmology on 07/19/24. Recent MRI of brain on 07/18/24 showed nonspecific mild cerebral white matter T2 changes.   She sees [facility].   Tobacco use: Does not smoke.   Bowel/Bladder Dysfunction: none  Conservative measures:  Physical therapy: in PT at [facility] for Ehlers-Danlos, gait- initial eval on 07/09/24 and had 2 more visits through 08/05/24, nothing specific for her neck Multimodal medical therapy including regular antiinflammatories: Gabapentin , methocarbamol , Lyrica, fioricet  Injections: no epidural steroid injections  Past Surgery: no spinal surgeries  Melissa JONETTA Martinez has no symptoms of cervical myelopathy.  The symptoms are causing a significant impact on the patient's life.   Review of Systems:  A 10 point review of systems is negative, except for the pertinent positives and negatives detailed in the HPI.  Past Medical History: Past Medical History:  Diagnosis Date   Abdominal pain, generalized 11/11/2014   Altered bowel function 11/11/2014   Anxiety    tkes Prozac , for OCD related anxiety   Bleeding per rectum 11/11/2014   Calculus of kidney 12/14/2015   Complication of anesthesia     sensitive / stops breathing   Ehlers-Danlos syndrome 12/18/2022   Fatty infiltration of liver 11/11/2014   Hypertension    Occipital neuralgia    PCOS (polycystic ovarian syndrome)    PONV (postoperative nausea and vomiting)    Upper airway cough syndrome 08/17/2015   Followed in Pulmonary clinic/ [facility]/ [place]      Past Surgical History: Past Surgical History:  Procedure Laterality Date   ACHILLES TENDON SURGERY Left 04/20/2018   Procedure: ACHILLES TENDON REPAIR-SECONDARY;  Surgeon: Ashley Soulier, DPM;  Location: [facility] OR[facility];  Service: Podiatry;  Laterality: Left;   BALLOON SINUPLASTY Left 07/17/2019   Procedure: BALLOON SINUPLASTY;  Surgeon: Milissa Hamming, MD;  Location: [facility];  Service: ENT;  Laterality: Left;   CESAREAN SECTION N/A 06/22/2016   Procedure: CESAREAN SECTION;  Surgeon: Lamar SHAUNNA Lesches, MD;  Location: [facility] OR[facility];  Service: Obstetrics;  Laterality: N/A;   CHOLECYSTECTOMY     COLONOSCOPY  09/2014   DIAGNOSTIC LAPAROSCOPY WITH REMOVAL OF ECTOPIC PREGNANCY Right 12/06/2022   Procedure: DIAGNOSTIC LAPAROSCOPY WITH REMOVAL OF ECTOPIC PREGNANCY WITH RIGHT SALPINGECTOMY;  Surgeon: Lovetta Debby PARAS, MD;  Location: [facility] OR[facility];  Service: Gynecology;  Laterality: Right;   DILATION AND CURETTAGE OF UTERUS  2013   ENDOSCOPIC TURBINATE REDUCTION Bilateral 07/17/2019   Procedure: ENDOSCOPIC TURBINATE REDUCTION;  Surgeon: Milissa Hamming, MD;  Location: [facility];  Service: ENT;  Laterality: Bilateral;  NEED STRYKER DISK GAVE DISK TO BRENDA 8-20  KP rep coming Jessica Nihart   ESOPHAGOGASTRODUODENOSCOPY ENDOSCOPY     EYE SURGERY     lasik   hemmorhoidectomy  2012   HYSTEROSCOPY  2015  LAPAROSCOPIC CHOLECYSTECTOMY  06/2009   LASIK Bilateral    MAXILLARY ANTROSTOMY Right 07/17/2019   Procedure: MAXILLARY ANTROSTOMY;  Surgeon: Milissa Hamming, MD;  Location: [facility];  Service: ENT;  Laterality: Right;   OSTECTOMY Left 04/20/2018    Procedure: OSTECTOMY-HAGLUNDS/RETROCALCANEAL;  Surgeon: Ashley Soulier, DPM;  Location: [facility] OR[facility];  Service: Podiatry;  Laterality: Left;   STENT PLACE LEFT URETER ([facility] HX) Left 11/2012   has had 2 stones pass stones    Allergies: Allergies as of 08/06/2024 - Review Complete 08/06/2024  Allergen Reaction Noted   Levofloxacin Anaphylaxis, Itching, Other (See Comments), and Rash 02/05/2016   Metoclopramide Anxiety 06/12/2017   Nifedipine Rash 04/06/2023   Other Other (See Comments) 10/16/2017   Almond (diagnostic) Hives 07/11/2019   Bupropion Other (See Comments) 04/25/2024   Sulfa antibiotics Other (See Comments) 06/11/2017   Tape Hives 01/21/2021   Almond oil Hives 07/11/2019   Aspartame Itching 06/20/2016   Silicone Hives 01/21/2021   Sulfamethoxazole-trimethoprim  Itching and Rash 07/15/2014   Sulfasalazine Rash 06/11/2017    Medications: Outpatient Encounter Medications as of 08/06/2024  Medication Sig   albuterol  (PROVENTIL ) (2.5 MG/3ML) 0.083% nebulizer solution Take 2.5 mg by nebulization every 6 (six) hours as needed for wheezing or shortness of breath. With Budesonide .   albuterol  (VENTOLIN  HFA) 108 (90 Base) MCG/ACT inhaler Inhale 2 puffs into the lungs every 6 (six) hours as needed for wheezing or shortness of breath.   ascorbic acid (VITAMIN C) 500 MG tablet Take 100 mg by mouth 2 (two) times daily.   busPIRone  (BUSPAR ) 5 MG tablet Take 1 tablet by mouth 2 (two) times daily.   butalbital -acetaminophen -caffeine  (FIORICET ) 50-325-40 MG tablet Take 1-2 tablets by mouth every 6 (six) hours as needed for headache.   Cholecalciferol  (D2000 ULTRA STRENGTH) 50 MCG (2000 UT) CAPS Take by mouth.   clobetasol (TEMOVATE) 0.05 % external solution Apply 1 Application topically as needed.   drospirenone -ethinyl estradiol  (GIANVI) 3-0.02 MG tablet Take 1 tablet by mouth daily.   EPINEPHrine  0.3 mg/0.3 mL IJ SOAJ injection INJECT INTRAMUSCULARLY IN THE EVENT OF AN EMERGENT ALLERGIC REACTION  WITH DIFFICULTY BREATHING.   Fluoxetine  HCl, PMDD, 10 MG TABS Take 3 tablets by mouth daily.   fluticasone  (FLOVENT  HFA) 110 MCG/ACT inhaler Inhale 2 puffs into the lungs in the morning and at bedtime.   gabapentin  (NEURONTIN ) 100 MG capsule Take 1 capsule by mouth 3 times a day as needed for arm pain for up to 30 days   KRILL OIL PO Take 1 capsule by mouth daily.    MAGNESIUM  GLYCINATE PO Take by mouth.   methocarbamol  (ROBAXIN ) 500 MG tablet Take 1 tablet (500 mg total) by mouth 2 (two) times daily as needed for muscle spasm.   Multiple Vitamin (MULTI-VITAMIN) tablet Take 1 tablet by mouth daily.   ondansetron  (ZOFRAN -ODT) 4 MG disintegrating tablet Take 1 tablet (4 mg total) by mouth every 8 (eight) hours as needed   pregabalin (LYRICA) 50 MG capsule Take 50 mg by mouth 2 (two) times daily.   Semaglutide , 2 MG/DOSE, 8 MG/3ML SOPN Inject 0.75 mLs into the skin once a week.   SODIUM CHLORIDE , EXTERNAL, (SALINE WOUND WASH) 0.9 % SOLN Apply topically as needed. Pt reported   traZODone (DESYREL) 50 MG tablet Take 50 mg by mouth as needed. Pt reported   zinc gluconate 50 MG tablet Take by mouth.   No facility-administered encounter medications on file as of 08/06/2024.    Social History: Social History   Tobacco  Use   Smoking status: Never   Smokeless tobacco: Never  Vaping Use   Vaping status: Never Used  Substance Use Topics   Alcohol use: Not Currently    Alcohol/week: 0.0 standard drinks of alcohol   Drug use: No    Family Medical History: Family History  Problem Relation Age of Onset   Heart disease Father    Hyperlipidemia Father    Breast cancer Maternal Grandmother    Diabetes Maternal Grandmother    Bladder Cancer Maternal Grandfather    Heart disease Maternal Grandfather    Kidney Stones Mother    Hypertension Mother    Hypertension Brother    Kidney cancer Neg Hx     Physical Examination: Vitals:   08/06/24 1402  BP: 108/70    General: Patient is well  developed, well nourished, calm, collected, and in no apparent distress. Attention to examination is appropriate.  Respiratory: Patient is breathing without any difficulty.   NEUROLOGICAL:     Awake, alert, oriented to person, place, and time.  Speech is clear and fluent. Fund of knowledge is appropriate.   No posterior cervical tenderness. Mild tenderness in bilateral trapezial region.   No abnormal lesions on exposed skin.   Strength: Side Biceps Triceps Deltoid Interossei Grip Wrist Ext. Wrist Flex.  R 5 5 5 5 5 5 5   L 5 5 5 5 5 5 5    Side Iliopsoas Quads Hamstring PF DF EHL  R 5 5 5 5 5 5   L 5 5 5 5 5 5    Reflexes are 2+ and symmetric at the biceps, brachioradialis, patella and achilles.   Hoffman's is absent.  Clonus is not present.   Bilateral upper and lower extremity sensation is intact to light touch.     No pain with IR/ER of both hips.   She has good ROM of her shoulders with no pain.   Gait is normal.    Medical Decision Making  Imaging: Cervical xrays dated 06/04/24:  FINDINGS:  Regional soft tissues unremarkable.  No aggressive osseus lesion.  No significant disc height loss.  Unchanged tiny anterior disc  calcifications C4-5 and C5-6.  No significant uncovertebral hypertrophy.  Impression  No acute osseous abnormality.  BRENDAN CHRISTOPHER CLINE, MD     Cervical MRI dated 04/03/24:  FINDINGS:   BONES:  #  Vertebral body heights are maintained.  #  No acute fracture.  #  No focal lesions.   SPINAL CORD:  #  Normal size, contour, and signal without evidence of mass or lesion.   ALIGNMENT:  #  Anatomic without subluxation.   DISC LEVELS:   #  C1-C2: Mild atlantodental joint degenerative changes. No stenosis.   #  C2-C3: Facet/uncovertebral joint degenerative changes. Mild right foraminal stenosis. No significant central stenosis.   #  C3-C4: Facet/uncovertebral joint degenerative changes contribute to mild right foraminal stenosis. No  significant central stenosis.   #  C4-C5: Mild disc bulge/posterior disc osteophyte complex indenting the ventral thecal sac. Facet/uncovertebral joint degenerative changes contribute to mild right foraminal stenosis.   #  C5-C6: Mild degenerative disc disease. Mild disc bulge/posterior discussed by complex indenting the ventral thecal sac. Facet/uncovertebral joint degenerative changes contribute to mild bilateral foraminal stenosis.   #  C6-C7: No significant central or foraminal stenosis.   #  C7-T1: No significant central or foraminal stenosis.   PARASPINAL TISSUES:  #  Within normal limits.   CRANIOCERVICAL JUNCTION:  #  Within normal limits.  IMPRESSION:  1.  Multilevel cervical spondylosis, as detailed above.  2.  Mild central stenosis at C4-C5 and C5-C6.  3.  Varying degrees of mild foraminal stenosis.   Electronically Signed by: Emeline JINNY Poisson, MD on 04/09/2024 9:01 AM    I have personally reviewed the images and agree with the above interpretation.  Assessment and Plan: Ms. Lariccia has constant neck pain with radiation to her shoulders. No arm pain x years. She has diffuse weakness. No numbness or tingling. She notes chronic headaches as well.   She has no dexterity issues. She has chronic balance issues.  She has known cervical spondylosis with very mild central stenosis C4-C6 and mild right foraminal stenosis C4-C5 with mild bilateral foraminal stenosis C5-C6.   She has [name]- no instability seen on previous xrays. She is also having workup for idiopathic intracranial HTN- has LP tomorrow.   Neck pain may be due to spondylosis. Do not see any cervical cause of balance issues.   Treatment options discussed with patient and following plan made:   - Cervical xrays with flex/ext to evaluate for any possible instability (has Elhers Danlos).  - Will message her next week about cervical xrays and regroup regarding LP results.  - May consider referral for possible  injections and/or PT depending on above.   I spent a total of 35 minutes in face-to-face and non-face-to-face activities related to this patient's care today including review of outside records, review of imaging, review of symptoms, physical exam, discussion of differential diagnosis, discussion of treatment options, and documentation.   Thank you for involving me in the care of this patient.   Glade Boys PA-C Dept. of Neurosurgery

## 2024-08-06 ENCOUNTER — Ambulatory Visit
Admission: RE | Admit: 2024-08-06 | Discharge: 2024-08-06 | Disposition: A | Discharge status: Home / Self Care | Attending: Orthopedic Surgery | Admitting: Orthopedic Surgery

## 2024-08-06 ENCOUNTER — Encounter: Payer: Self-pay | Admitting: Orthopedic Surgery

## 2024-08-06 ENCOUNTER — Ambulatory Visit (INDEPENDENT_AMBULATORY_CARE_PROVIDER_SITE_OTHER): Admitting: Orthopedic Surgery

## 2024-08-06 ENCOUNTER — Ambulatory Visit
Admission: RE | Admit: 2024-08-06 | Discharge: 2024-08-06 | Disposition: A | Discharge status: Home / Self Care | Source: Ambulatory Visit | Attending: Orthopedic Surgery | Admitting: Orthopedic Surgery

## 2024-08-06 VITALS — BP 108/70 | Ht 68.0 in | Wt 224.0 lb

## 2024-08-06 DIAGNOSIS — Q796 Ehlers-Danlos syndrome, unspecified: Secondary | ICD-10-CM | POA: Insufficient documentation

## 2024-08-06 DIAGNOSIS — M4802 Spinal stenosis, cervical region: Secondary | ICD-10-CM | POA: Diagnosis not present

## 2024-08-06 DIAGNOSIS — M47812 Spondylosis without myelopathy or radiculopathy, cervical region: Secondary | ICD-10-CM

## 2024-08-06 NOTE — Patient Instructions (Signed)
 It was so nice to see you today. Thank you so much for coming in.    You have some mild wear and tear in your neck with very mild spinal stenosis, and mild irritation of nerves on right side.   I ordered xrays of your neck with flexion/extension. You can get these at [facility] (building with the white pillars) off of [address]. The address is [address], [place], [place] [address]. You do not need any appointment.   I will message you early next week so we can regroup after your xrays and lumbar puncture.   Please do not hesitate to call if you have any questions or concerns. You can also message me in MyChart.   Glade Boys PA-C [phone]     The physicians and staff at [facility] at [facility] are committed to providing excellent care. You may receive a survey asking for feedback about your experience at our office. We value you your feedback and appreciate you taking the time to to fill it out. The [facility] leadership team is also available to discuss your experience in person, feel free to contact us  [phone].

## 2024-08-13 NOTE — ED Triage Notes (Signed)
 Pt ambulates to triage with c/o CSF leak since Saturday when pt felt pop followed by swelling at site of LP (Wednesday). Pt also reports pain and tenderness at site. Pt had temp 99.4 on day of procedure, no fever since. Denies headache.

## 2024-08-14 ENCOUNTER — Encounter: Payer: Self-pay | Admitting: Orthopedic Surgery

## 2024-08-14 DIAGNOSIS — Q796 Ehlers-Danlos syndrome, unspecified: Secondary | ICD-10-CM

## 2024-08-14 DIAGNOSIS — M47812 Spondylosis without myelopathy or radiculopathy, cervical region: Secondary | ICD-10-CM

## 2024-08-14 NOTE — Telephone Encounter (Signed)
 Cervical xrays dated 08/06/24:  FINDINGS: Straightening of the normal cervical lordosis. No significant listhesis in neutral or extension views. Trace grade 1 anterolisthesis of C3 on C4, C4 on C5, and C5 on C6 with flexion.   Normal C1-2 articulations are preserved in the dens is intact. Vertebral body height maintained without acute or chronic fracture. Intervertebral disc space height maintained. Visualized soft tissues of the neck within normal limits. Visualized lung apices are clear.   IMPRESSION: 1. Trace grade 1 anterolisthesis of C3 on C4, C4 on C5, and C5 on C6 with flexion. 2. Otherwise unremarkable radiographs of the cervical spine.     Electronically Signed   By: Morene Hoard M.D.   On: 08/11/2024 22:23  Xrays reviewed with Dr. Claudene. He would think about doing facet type injections or medial branch blocks. He does not see a ton of pathologic instability.

## 2024-08-19 NOTE — Addendum Note (Signed)
 Addended byBETHA HILMA HASTINGS on: 08/19/2024 10:51 AM   Modules accepted: Orders

## 2024-08-19 NOTE — Telephone Encounter (Signed)
 Orders to PMR at [facility] for possible cervical injections. Follow up scheduled with me in 8 weeks.

## 2024-08-20 ENCOUNTER — Encounter: Payer: Self-pay | Admitting: Internal Medicine

## 2024-09-24 NOTE — Progress Notes (Addendum)
 [facility]                 PT Daily Progress Note & PT POC Update  Patient's Name:Melissa Martinez                        MRN #[id number]      Date:09/24/2024  Visit Number: 7  Progress Related to Long and Short Term Goals:                                                                                            [x] AROM:  all lumbar spine AROM no worse than minimal loss    [] PROM:     [x] Strength:   5/5 strength for all BLE MMT except 4-/5 R hip abd MMT + 4/5 L hip abd MMT [x] Pain: Worst pain rated at 7/10 while- [] at rest    [x] During activity:  [x]  Functional goal: Difficulty being on her feet longer than 3 hours due to increased pain, weakness and balance difficulty.    [x]  Gait: Improvement in gait when using LRAD such as a SPC.     []  Balance:     Patient / Family Education:    []  Progressed HEP to include:  [x]  Reviewed previous HEP:   ___________________________________________________________________________________   Skilled Service Provided:  To: []  right   []  left     [x]  bilateral    [x]   core/body stability and balance  [x]  neck   [x]   shoulder   []  upper arm/  []  elbow   []  lower arm  []  wrist   []  hand   []   fingers  [x]  back     [x]  hip    []  upper leg    []  knee  []  lower leg   []   ankle  foot            Ther Ex Therapeutic Procedure CPT 97110 : 50 minutes   Manual Tx      Ice / Heat            Heating pad applied to lumbar spine for 10 minutes with patient positioned in prone in order to:  E-Stim                 US          Mech Trax                      [x]  Inc ROM [] Inc joint mob [] Dec pain [] Dec inflam [] Inc Circ   [] Dec pain  [x]  Inc Flex []  Inc Flex [] Dec inflam [] Dec pain [] Dec pain [] Inc align  [x]  Inc  Bal []  Inc ROM [] Dec swelling [] Dec swelling [] Dec swelling [] Inc mob  [x]  Inc Strength [] Astym [] Inc circulation [] Inc circulation [] Dec inflam   Progressive  HEP          []   [] tension/ spasms/soft tissue mob [] Inc  soft tissue mobility [] Inc tissue mob    [x] Inc Posture [] Correct malaign  Settings:       Subjective: No new concerns.  Treatment notes:  Assessment of Treatment:  Patient demonstrated increased lumbar spine AROM, improved BLE strength, improved activity tolerance and functional mobility and an overall decrease in pain levels. All improvements noted above. Patient has met 3 of 7 goals and will continue to progress toward remaining goals, address remaining deficits and further decrease pain. Will extend PT POC by 6 weeks in order to decrease pain and progress toward remaining goals.  Long Term Goals: 1.  Patient will report no worse than 3/10 pain, grossly, while performing ADLs and functional mobility in order to improve quality of life. Partially MET 2.  Patient will be independent with a comprehensive HEP in order to improve ROM, strength, posture and function in an effort to perform ADLs with less pain and improved functional mobility. MET 3.  Patient will achieve 5/5 strength for all BLE MMT in order to perform ADLs and functional mobility with less pain.  Partially MET 4.  Patient will demonstrate no worse than minimal loss of lumbar spine AROM in all directions in order to perform work duties, gardening and other ADLs with less pain and improved tolerance.  MET 5.  Patient will be able to amb with LRAD without any difficulty demonstrating improved lumbosacral spine posture to allow for increased tolerance to walking activities at home, at work and in the community. MET 6.  Patient will demonstrate 45 deg of cervical SB bilaterally and 65 deg of cervical rotation bilaterally in order to perform ADLs and work duties with less pain and improved tolerance.  Partially MET 7.  Patient to tolerate standing, walking, and other weight bearing activity for up to 3-4 hours at a time without significant increase in symptoms in order to perform hobbies and work duties with less pain and improved  tolerance. Partially MET  Patient's Response to Treatment:  [x] Dec pain  [] Inc pain    [x]  Inc flexibility    [] Inc endurance   [x]  Inc strength     [x]  Inc AROM      []  Inc PROM       []  Dec Spasms    [x] Inc Posture / alignment     []  Inc Joint Mobility   []  Other:   Functional Improvement Noted:  Increased ability to  [x]  walk   [x]  stand for longer periods   []  dress with less help    []  lift    []   reach   []   bend  []  Other:  Remaining Impairment Requiring Continued Treatment:   [x]  Dec  flexibility   [x]  Dec  ROM   [x]   pain   [x]  Dec strength  [x]   weakness    []   Dec balance   []   swelling   []   inflammation   [x]   Spasm    [x]  posture/alignment    []   Other:  Plan: [x]   Continue Plan of Care        [x]   Add: 6 weeks to PT POC         []   Discharge after next visit    []   Discharge to HEP   []  Other: Generic Treatment Log   Date 09/17/24 09/24/24  Bike/Stepper/UBE S L3 10' L3 10'  Pelvic Tilt Ball 3x12   Scap Retraction    Shoulder Ext    Cervical Retraction    Cat Camel    Quadruped Thoracic Rotation    Pec Stretch 3x30 3x30  Wall Slides    Child's Pose    Cobra Stretch  Foam Roller Noted Above    Foam roll sequence    LAQ on ball 3x12   March on ball  3x12   Tspine Rotation    LTR  3x15  Heel taps on ball 3x12   Palloff press Black 3x10 Silver  3x10  Bridging 3x15 Blue 3x10  Hip Flex 35# 3x10 35# 3x10  Hip Abd 35# 3x10 35# 3x10  Chop/Lift  Blue 2x10                  Manual Noted Above        Moist Heating Pad       PT Billing Documentation Date of Onset: 06/20/24 Visit Number: 7       Frequently Used Timed Codes Therapeutic Procedure CPT 97110 : 50 minutes                  Total Treatment Time: 50 minutes     Therapist's : MARIAH J MOGER, PT

## 2024-09-30 NOTE — Telephone Encounter (Signed)
 Called pt  Recomm she have [facility] discuss with Dr JAYSON Fuse who administered tilt table test   Ask her what she would recommend be done for symptoms

## 2024-10-07 NOTE — Progress Notes (Signed)
 Referring Physician:  Cyrus Selinda Moose, PA-C [id number] [place],  [place] [address]  Primary Physician:  Cyrus Selinda Moose, PA-C  History of Present Illness: Ms. Costella Schwarz has a history of HTN, PCOS, Ehlers-Danlos syndrome, FM, neuropathy, hyperlipidemia, obesity, dysautonomia.   She sees rheumatology. She sees neurology as well- think she has more of a migraine spectrum disorder and not idiopathic intracranial hypertension.   She has a history of fibromyalgia with diffuse pain all over.   Last seen by me on 08/06/24 for neck and bilateral shoulder pain x years. She has known cervical spondylosis with very mild central stenosis C4-C6 and mild right foraminal stenosis C4-C5 with mild bilateral foraminal stenosis C5-C6.    She has [name]- no instability seen on previous xrays.   She has minimal instability on her xrays. Dr. Claudene recommended referral for cervical facet injections/MBB.   She was referred to PMR at [facility] to consider injections- they did not call her.   She is here for follow up.   She is about the same.   She has constant neck pain with radiation to her shoulders. No arm pain x years. She has diffuse weakness. No specific aggravating or alleviating factors. She notes grinding when she turns her head. No numbness or tingling. She notes chronic headaches as well. She had vision loss in both eyes- is seeing neuro-ophthalmology at [facility]. She also has constant severe tinnitus- it varies in severity.   She has no dexterity issues. She has chronic balance issues.   She has seen pain management at [facility].   Tobacco use: Does not smoke.   Bowel/Bladder Dysfunction: none  Conservative measures:  Physical therapy: in PT at [facility] for Ehlers-Danlos, gait- initial eval on 07/09/24 with 6 visits through 10/07/24 Multimodal medical therapy including regular antiinflammatories: Gabapentin , methocarbamol , Lyrica, fioricet  Injections: no epidural steroid  injections  Past Surgery: no spinal surgeries  [name] has no symptoms of cervical myelopathy.  The symptoms are causing a significant impact on the patient's life.   Review of Systems:  A 10 point review of systems is negative, except for the pertinent positives and negatives detailed in the HPI.  Past Medical History: Past Medical History:  Diagnosis Date   Abdominal pain, generalized 11/11/2014   Altered bowel function 11/11/2014   Anxiety    tkes Prozac , for OCD related anxiety   Bleeding per rectum 11/11/2014   Calculus of kidney 12/14/2015   Complication of anesthesia    sensitive / stops breathing   Ehlers-Danlos syndrome 12/18/2022   Fatty infiltration of liver 11/11/2014   Hypertension    Occipital neuralgia    PCOS (polycystic ovarian syndrome)    PONV (postoperative nausea and vomiting)    Upper airway cough syndrome 08/17/2015   Followed in Pulmonary clinic/ [facility]/ [place]      Past Surgical History: Past Surgical History:  Procedure Laterality Date   ACHILLES TENDON SURGERY Left 04/20/2018   Procedure: ACHILLES TENDON REPAIR-SECONDARY;  Surgeon: Ashley Soulier, DPM;  Location: [facility] ORS;  Service: Podiatry;  Laterality: Left;   BALLOON SINUPLASTY Left 07/17/2019   Procedure: BALLOON SINUPLASTY;  Surgeon: Milissa Hamming, MD;  Location: [facility];  Service: ENT;  Laterality: Left;   CESAREAN SECTION N/A 06/22/2016   Procedure: CESAREAN SECTION;  Surgeon: Lamar SHAUNNA Lesches, MD;  Location: [facility] ORS;  Service: Obstetrics;  Laterality: N/A;   CHOLECYSTECTOMY     COLONOSCOPY  09/2014   DIAGNOSTIC LAPAROSCOPY WITH REMOVAL OF ECTOPIC PREGNANCY  Right 12/06/2022   Procedure: DIAGNOSTIC LAPAROSCOPY WITH REMOVAL OF ECTOPIC PREGNANCY WITH RIGHT SALPINGECTOMY;  Surgeon: Schermerhorn, Debby PARAS, MD;  Location: [facility] OR[facility];  Service: Gynecology;  Laterality: Right;   DILATION AND CURETTAGE OF UTERUS  2013   ENDOSCOPIC TURBINATE REDUCTION Bilateral 07/17/2019    Procedure: ENDOSCOPIC TURBINATE REDUCTION;  Surgeon: Milissa Hamming, MD;  Location: [facility];  Service: ENT;  Laterality: Bilateral;  NEED STRYKER DISK GAVE DISK TO BRENDA 8-20  KP rep coming Jessica Nihart   ESOPHAGOGASTRODUODENOSCOPY ENDOSCOPY     EYE SURGERY     lasik   hemmorhoidectomy  2012   HYSTEROSCOPY  2015   LAPAROSCOPIC CHOLECYSTECTOMY  06/2009   LASIK Bilateral    MAXILLARY ANTROSTOMY Right 07/17/2019   Procedure: MAXILLARY ANTROSTOMY;  Surgeon: Milissa Hamming, MD;  Location: [facility];  Service: ENT;  Laterality: Right;   OSTECTOMY Left 04/20/2018   Procedure: OSTECTOMY-HAGLUNDS/RETROCALCANEAL;  Surgeon: Ashley Soulier, DPM;  Location: [facility] OR[facility];  Service: Podiatry;  Laterality: Left;   STENT PLACE LEFT URETER ([facility] HX) Left 11/2012   has had 2 stones pass stones    Allergies: Allergies as of 10/16/2024 - Review Complete 10/16/2024  Allergen Reaction Noted   Levofloxacin Anaphylaxis, Itching, Other (See Comments), and Rash 02/05/2016   Metoclopramide Anxiety 06/12/2017   Nifedipine Rash 04/06/2023   Other Other (See Comments) 10/16/2017   Almond (diagnostic) Hives 07/11/2019   Bupropion Other (See Comments) 04/25/2024   Sulfa antibiotics Other (See Comments) 06/11/2017   Tape Hives 01/21/2021   Almond oil Hives 07/11/2019   Aspartame Itching 06/20/2016   Silicone Hives 01/21/2021   Sulfamethoxazole-trimethoprim  Itching and Rash 07/15/2014   Sulfasalazine Rash 06/11/2017    Medications: Outpatient Encounter Medications as of 10/16/2024  Medication Sig   acetaZOLAMIDE (DIAMOX) 250 MG tablet Take 250 mg by mouth daily.   albuterol  (PROVENTIL ) (2.5 MG/3ML) 0.083% nebulizer solution Take 2.5 mg by nebulization every 6 (six) hours as needed for wheezing or shortness of breath. With Budesonide .   albuterol  (VENTOLIN  HFA) 108 (90 Base) MCG/ACT inhaler Inhale 2 puffs into the lungs every 6 (six) hours as needed for wheezing or shortness of breath.    ascorbic acid (VITAMIN C) 500 MG tablet Take 100 mg by mouth 2 (two) times daily.   azelastine (ASTELIN) 0.1 % nasal spray Place 1 spray into both nostrils daily.   busPIRone  (BUSPAR ) 5 MG tablet Take 1 tablet by mouth 2 (two) times daily.   butalbital -acetaminophen -caffeine  (FIORICET ) 50-325-40 MG tablet Take 1-2 tablets by mouth every 6 (six) hours as needed for headache.   Cholecalciferol  (D2000 ULTRA STRENGTH) 50 MCG (2000 UT) CAPS Take by mouth.   clobetasol (TEMOVATE) 0.05 % external solution Apply 1 Application topically as needed.   cyanocobalamin (VITAMIN B12) 1000 MCG/ML injection Inject 1,000 mcg into the skin every 21 ( twenty-one) days.   drospirenone -ethinyl estradiol  (GIANVI) 3-0.02 MG tablet Take 1 tablet by mouth daily.   EPINEPHrine  0.3 mg/0.3 mL IJ SOAJ injection INJECT INTRAMUSCULARLY IN THE EVENT OF AN EMERGENT ALLERGIC REACTION WITH DIFFICULTY BREATHING. (Patient taking differently: as needed.)   Fluoxetine  HCl, PMDD, 10 MG TABS Take 3 tablets by mouth daily.   fluticasone  (FLONASE ) 50 MCG/ACT nasal spray Place 1 spray into the nose daily.   fluticasone  (FLOVENT  HFA) 110 MCG/ACT inhaler Inhale 2 puffs into the lungs in the morning and at bedtime.   gabapentin  (NEURONTIN ) 100 MG capsule Take 1 capsule by mouth 3 times a day as needed for arm pain for up  to 30 days (Patient taking differently: Take by mouth as needed.)   KRILL OIL PO Take 1 capsule by mouth daily.    MAGNESIUM  GLYCINATE PO Take by mouth. (Patient taking differently: Take by mouth daily.)   methocarbamol  (ROBAXIN ) 500 MG tablet Take 1 tablet (500 mg total) by mouth 2 (two) times daily as needed for muscle spasm.   Multiple Vitamin (MULTI-VITAMIN) tablet Take 1 tablet by mouth daily.   naloxone  (NARCAN ) nasal spray 4 mg/0.1 mL Place 1 spray into the nose as needed.   ondansetron  (ZOFRAN -ODT) 4 MG disintegrating tablet Take 1 tablet (4 mg total) by mouth every 8 (eight) hours as needed (Patient taking differently:  Take by mouth as needed.)   pregabalin (LYRICA) 50 MG capsule Take 50 mg by mouth 2 (two) times daily. (Patient taking differently: Take 100 mg by mouth 2 (two) times daily. 100 every morning, 200 hs)   SODIUM CHLORIDE , EXTERNAL, (SALINE WOUND WASH) 0.9 % SOLN Apply topically as needed. Pt reported   traZODone (DESYREL) 50 MG tablet Take 50 mg by mouth as needed. Pt reported   zinc gluconate 50 MG tablet Take by mouth. (Patient taking differently: Take by mouth every other day.)   Semaglutide , 2 MG/DOSE, 8 MG/3ML SOPN Inject 0.75 mLs into the skin once a week.   No facility-administered encounter medications on file as of 10/16/2024.    Social History: Social History   Tobacco Use   Smoking status: Never   Smokeless tobacco: Never  Vaping Use   Vaping status: Never Used  Substance Use Topics   Alcohol use: Not Currently    Alcohol/week: 0.0 standard drinks of alcohol   Drug use: No    Family Medical History: Family History  Problem Relation Age of Onset   Heart disease Father    Hyperlipidemia Father    Breast cancer Maternal Grandmother    Diabetes Maternal Grandmother    Bladder Cancer Maternal Grandfather    Heart disease Maternal Grandfather    Kidney Stones Mother    Hypertension Mother    Hypertension Brother    Kidney cancer Neg Hx     Physical Examination: Vitals:   10/16/24 1335  BP: 128/86    Awake, alert, oriented to person, place, and time.  Speech is clear and fluent. Fund of knowledge is appropriate.   No posterior cervical tenderness. Mild tenderness in bilateral trapezial region.   No abnormal lesions on exposed skin.   Strength: Side Biceps Triceps Deltoid Interossei Grip Wrist Ext. Wrist Flex.  R 5 5 5 5 5 5 5   L 5 5 5 5 5 5 5    Reflexes are 2+ and symmetric at the biceps, brachioradialis. Hoffman's is absent.   Bilateral upper extremity sensation is intact to light touch.     Gait is normal.    Medical Decision  Making  Imaging: None  Assessment and Plan: Ms. Moe has constant neck pain with radiation to her shoulders. No arm pain x years. She has diffuse weakness. No numbness or tingling. She notes chronic headaches as well.   She has no dexterity issues. She has chronic balance issues.  She has known cervical spondylosis with very mild central stenosis C4-C6 and mild right foraminal stenosis C4-C5 with mild bilateral foraminal stenosis C5-C6.    She has [name]- she has minimal instability on her flex/ext xrays.   Neck pain may be due to spondylosis. Do not see any cervical cause of balance issues.   Treatment options discussed  with patient and following plan made:   - Continue to recommend cervical facet injections/MBB. Referral to PMR at [facility]. She will call if she does not hear from them.  - We spent a long time discussing her neurological symptoms and concerns. She is interested in getting a second opinion regarding her recent diagnosis of migraine spectrum disorder and not idiopathic intracranial hypertension. Message to Dr. Leigh regarding this. Will let her know when I hear back from him.  - I will send her a message after the first of the year to check on her.  - Follow up scheduled with me in February. May consider changing to Dr. Claudene. She will let me know.   I spent a total of 30 minutes in face-to-face and non-face-to-face activities related to this patient's care today including review of outside records, review of imaging, review of symptoms, physical exam, discussion of differential diagnosis, discussion of treatment options, and documentation.   Glade Boys PA-C Dept. of Neurosurgery

## 2024-10-09 ENCOUNTER — Encounter: Payer: Self-pay | Admitting: Internal Medicine

## 2024-10-16 ENCOUNTER — Ambulatory Visit: Admitting: Orthopedic Surgery

## 2024-10-16 ENCOUNTER — Encounter: Payer: Self-pay | Admitting: Orthopedic Surgery

## 2024-10-16 VITALS — BP 128/86 | Ht 68.0 in | Wt 218.4 lb

## 2024-10-16 DIAGNOSIS — M48061 Spinal stenosis, lumbar region without neurogenic claudication: Secondary | ICD-10-CM | POA: Diagnosis not present

## 2024-10-16 DIAGNOSIS — M47812 Spondylosis without myelopathy or radiculopathy, cervical region: Secondary | ICD-10-CM

## 2024-10-16 DIAGNOSIS — Q796 Ehlers-Danlos syndrome, unspecified: Secondary | ICD-10-CM

## 2024-10-16 NOTE — Patient Instructions (Signed)
 It was so nice to see you today. Thank you so much for coming in.    You have some mild wear and tear in your neck with very mild spinal stenosis, and mild irritation of nerves on right side. Only minimal instability seen on your xrays.   I want you to see physical medicine and rehab at the [facility] to discuss possible injections in your neck. Dr. Avanell, Dr. Dodson, and their NP [place] are great and will take good care of you. They should call you to schedule an appointment or you can call them at [phone].   I want to discuss things with Dr. Leigh at [facility] neurology in [place]. I will let message you with his further recommendations.   I will send you a message after the first of the year to check in. You are scheduled to see me back in February. Let me know if you want to change this visit to Dr. Claudene.   Please do not hesitate to call if you have any questions or concerns. You can also message me in MyChart.   Glade Boys PA-C [phone]     The physicians and staff at [facility] at [facility] are committed to providing excellent care. You may receive a survey asking for feedback about your experience at our office. We value you your feedback and appreciate you taking the time to to fill it out. The [facility] leadership team is also available to discuss your experience in person, feel free to contact us  [phone].

## 2024-10-30 ENCOUNTER — Other Ambulatory Visit: Payer: Self-pay | Admitting: Physical Medicine & Rehabilitation

## 2024-10-30 DIAGNOSIS — M5412 Radiculopathy, cervical region: Secondary | ICD-10-CM

## 2024-11-25 NOTE — Progress Notes (Unsigned)
 "  Referring Physician:  Cyrus Selinda Moose, PA-C [address] [place],  [place] [address]  Primary Physician:  Melissa Selinda Moose, PA-C   History of Present Illness: 12/05/2023      History of Present Illness: 10/16/2024 Note from [facility], [place]  [name] has a history of HTN, PCOS, Ehlers-Danlos syndrome, FM, neuropathy, hyperlipidemia, obesity, dysautonomia.   She sees rheumatology. She sees neurology as well- think she has more of a migraine spectrum disorder and not idiopathic intracranial hypertension.   She has a history of fibromyalgia with diffuse pain all over.   Last seen by me on 08/06/24 for neck and bilateral shoulder pain x years. She has known cervical spondylosis with very mild central stenosis C4-C6 and mild right foraminal stenosis C4-C5 with mild bilateral foraminal stenosis C5-C6.    She has [facility]- no instability seen on previous xrays.   She has minimal instability on her xrays. Dr. Claudene recommended referral for cervical facet injections/MBB.   She was referred to PMR at [facility] to consider injections- they did not call her.   She is here for follow up.   She is about the same.   She has constant neck pain with radiation to her shoulders. No arm pain x years. She has diffuse weakness. No specific aggravating or alleviating factors. She notes grinding when she turns her head. No numbness or tingling. She notes chronic headaches as well. She had vision loss in both eyes- is seeing neuro-ophthalmology at [facility]. She also has constant severe tinnitus- it varies in severity.   She has no dexterity issues. She has chronic balance issues.   She has seen pain management at [facility].   Tobacco use: Does not smoke.   Bowel/Bladder Dysfunction: none  Conservative measures:  Physical therapy: in PT at [facility] for Ehlers-Danlos, gait- initial eval on 07/09/24 with 6 visits through 10/07/24 Multimodal medical therapy including regular  antiinflammatories: Gabapentin , methocarbamol , Lyrica, fioricet  Injections: no epidural steroid injections  Past Surgery: no spinal surgeries  [facility] has no symptoms of cervical myelopathy.  The symptoms are causing a significant impact on the patient's life.   Review of Systems:  A 10 point review of systems is negative, except for the pertinent positives and negatives detailed in the HPI.  Past Medical History: Past Medical History:  Diagnosis Date   Abdominal pain, generalized 11/11/2014   Altered bowel function 11/11/2014   Anxiety    tkes Prozac , for OCD related anxiety   Bleeding per rectum 11/11/2014   Calculus of kidney 12/14/2015   Complication of anesthesia    sensitive / stops breathing   Ehlers-Danlos syndrome 12/18/2022   Fatty infiltration of liver 11/11/2014   Hypertension    Occipital neuralgia    PCOS (polycystic ovarian syndrome)    PONV (postoperative nausea and vomiting)    Upper airway cough syndrome 08/17/2015   Followed in Pulmonary clinic/ [facility]/ [place]      Past Surgical History: Past Surgical History:  Procedure Laterality Date   ACHILLES TENDON SURGERY Left 04/20/2018   Procedure: ACHILLES TENDON REPAIR-SECONDARY;  Surgeon: Ashley Soulier, DPM;  Location: [facility];  Service: Podiatry;  Laterality: Left;   BALLOON SINUPLASTY Left 07/17/2019   Procedure: BALLOON SINUPLASTY;  Surgeon: Milissa Hamming, MD;  Location: [facility];  Service: ENT;  Laterality: Left;   CESAREAN SECTION N/A 06/22/2016   Procedure: CESAREAN SECTION;  Surgeon: Lamar SHAUNNA Lesches, MD;  Location: [facility];  Service: Obstetrics;  Laterality: N/A;  CHOLECYSTECTOMY     COLONOSCOPY  09/2014   DIAGNOSTIC LAPAROSCOPY WITH REMOVAL OF ECTOPIC PREGNANCY Right 12/06/2022   Procedure: DIAGNOSTIC LAPAROSCOPY WITH REMOVAL OF ECTOPIC PREGNANCY WITH RIGHT SALPINGECTOMY;  Surgeon: Lovetta Debby PARAS, MD;  Location: [facility];  Service: Gynecology;  Laterality: Right;    DILATION AND CURETTAGE OF UTERUS  2013   ENDOSCOPIC TURBINATE REDUCTION Bilateral 07/17/2019   Procedure: ENDOSCOPIC TURBINATE REDUCTION;  Surgeon: Milissa Hamming, MD;  Location: [facility];  Service: ENT;  Laterality: Bilateral;  NEED STRYKER DISK GAVE DISK TO BRENDA 8-20  KP rep coming Jessica Nihart   ESOPHAGOGASTRODUODENOSCOPY ENDOSCOPY     EYE SURGERY     lasik   hemmorhoidectomy  2012   HYSTEROSCOPY  2015   LAPAROSCOPIC CHOLECYSTECTOMY  06/2009   LASIK Bilateral    MAXILLARY ANTROSTOMY Right 07/17/2019   Procedure: MAXILLARY ANTROSTOMY;  Surgeon: Milissa Hamming, MD;  Location: [facility];  Service: ENT;  Laterality: Right;   OSTECTOMY Left 04/20/2018   Procedure: OSTECTOMY-HAGLUNDS/RETROCALCANEAL;  Surgeon: Ashley Soulier, DPM;  Location: [facility];  Service: Podiatry;  Laterality: Left;   STENT PLACE LEFT URETER ([facility] HX) Left 11/2012   has had 2 stones pass stones    Allergies: Allergies as of 12/04/2024 - Review Complete 10/16/2024  Allergen Reaction Noted   Levofloxacin Anaphylaxis, Itching, Other (See Comments), and Rash 02/05/2016   Metoclopramide Anxiety 06/12/2017   Nifedipine Rash 04/06/2023   Other Other (See Comments) 10/16/2017   Almond (diagnostic) Hives 07/11/2019   Bupropion Other (See Comments) 04/25/2024   Sulfa antibiotics Other (See Comments) 06/11/2017   Tape Hives 01/21/2021   Almond oil Hives 07/11/2019   Aspartame Itching 06/20/2016   Silicone Hives 01/21/2021   Sulfamethoxazole-trimethoprim  Itching and Rash 07/15/2014   Sulfasalazine Rash 06/11/2017    Medications: Outpatient Encounter Medications as of 12/04/2024  Medication Sig   acetaZOLAMIDE (DIAMOX) 250 MG tablet Take 250 mg by mouth daily.   albuterol  (PROVENTIL ) (2.5 MG/3ML) 0.083% nebulizer solution Take 2.5 mg by nebulization every 6 (six) hours as needed for wheezing or shortness of breath. With Budesonide .   albuterol  (VENTOLIN  HFA) 108 (90 Base) MCG/ACT inhaler  Inhale 2 puffs into the lungs every 6 (six) hours as needed for wheezing or shortness of breath.   ascorbic acid (VITAMIN C) 500 MG tablet Take 100 mg by mouth 2 (two) times daily.   azelastine (ASTELIN) 0.1 % nasal spray Place 1 spray into both nostrils daily.   busPIRone  (BUSPAR ) 5 MG tablet Take 1 tablet by mouth 2 (two) times daily.   butalbital -acetaminophen -caffeine  (FIORICET ) 50-325-40 MG tablet Take 1-2 tablets by mouth every 6 (six) hours as needed for headache.   Cholecalciferol  (D2000 ULTRA STRENGTH) 50 MCG (2000 UT) CAPS Take by mouth.   clobetasol (TEMOVATE) 0.05 % external solution Apply 1 Application topically as needed.   cyanocobalamin (VITAMIN B12) 1000 MCG/ML injection Inject 1,000 mcg into the skin every 21 ( twenty-one) days.   drospirenone -ethinyl estradiol  (GIANVI) 3-0.02 MG tablet Take 1 tablet by mouth daily.   EPINEPHrine  0.3 mg/0.3 mL IJ SOAJ injection INJECT INTRAMUSCULARLY IN THE EVENT OF AN EMERGENT ALLERGIC REACTION WITH DIFFICULTY BREATHING. (Patient taking differently: as needed.)   Fluoxetine  HCl, PMDD, 10 MG TABS Take 3 tablets by mouth daily.   fluticasone  (FLONASE ) 50 MCG/ACT nasal spray Place 1 spray into the nose daily.   fluticasone  (FLOVENT  HFA) 110 MCG/ACT inhaler Inhale 2 puffs into the lungs in the morning and at bedtime.   gabapentin  (NEURONTIN ) 100 MG  capsule Take 1 capsule by mouth 3 times a day as needed for arm pain for up to 30 days (Patient taking differently: Take by mouth as needed.)   KRILL OIL PO Take 1 capsule by mouth daily.    MAGNESIUM  GLYCINATE PO Take by mouth. (Patient taking differently: Take by mouth daily.)   methocarbamol  (ROBAXIN ) 500 MG tablet Take 1 tablet (500 mg total) by mouth 2 (two) times daily as needed for muscle spasm.   Multiple Vitamin (MULTI-VITAMIN) tablet Take 1 tablet by mouth daily.   naloxone  (NARCAN ) nasal spray 4 mg/0.1 mL Place 1 spray into the nose as needed.   ondansetron  (ZOFRAN -ODT) 4 MG disintegrating  tablet Take 1 tablet (4 mg total) by mouth every 8 (eight) hours as needed (Patient taking differently: Take by mouth as needed.)   pregabalin (LYRICA) 50 MG capsule Take 50 mg by mouth 2 (two) times daily. (Patient taking differently: Take 100 mg by mouth 2 (two) times daily. 100 every morning, 200 hs)   Semaglutide , 2 MG/DOSE, 8 MG/3ML SOPN Inject 0.75 mLs into the skin once a week.   SODIUM CHLORIDE , EXTERNAL, (SALINE WOUND WASH) 0.9 % SOLN Apply topically as needed. Pt reported   traZODone (DESYREL) 50 MG tablet Take 50 mg by mouth as needed. Pt reported   zinc gluconate 50 MG tablet Take by mouth. (Patient taking differently: Take by mouth every other day.)   No facility-administered encounter medications on file as of 12/04/2024.    Social History: Social History   Tobacco Use   Smoking status: Never   Smokeless tobacco: Never  Vaping Use   Vaping status: Never Used  Substance Use Topics   Alcohol use: Not Currently    Alcohol/week: 0.0 standard drinks of alcohol   Drug use: No    Family Medical History: Family History  Problem Relation Age of Onset   Heart disease Father    Hyperlipidemia Father    Breast cancer Maternal Grandmother    Diabetes Maternal Grandmother    Bladder Cancer Maternal Grandfather    Heart disease Maternal Grandfather    Kidney Stones Mother    Hypertension Mother    Hypertension Brother    Kidney cancer Neg Hx     Physical Examination: There were no vitals filed for this visit.   Awake, alert, oriented to person, place, and time.  Speech is clear and fluent. Fund of knowledge is appropriate.   No posterior cervical tenderness. Mild tenderness in bilateral trapezial region.   No abnormal lesions on exposed skin.   Strength: Side Biceps Triceps Deltoid Interossei Grip Wrist Ext. Wrist Flex.  R 5 5 5 5 5 5 5   L 5 5 5 5 5 5 5    Reflexes are 2+ and symmetric at the biceps, brachioradialis. Hoffman's is absent.   Bilateral upper  extremity sensation is intact to light touch.     Gait is normal.    Medical Decision Making  Imaging: None  Assessment and Plan: Ms. Digiulio has constant neck pain with radiation to her shoulders. No arm pain x years. She has diffuse weakness. No numbness or tingling. She notes chronic headaches as well.   She has no dexterity issues. She has chronic balance issues.  She has known cervical spondylosis with very mild central stenosis C4-C6 and mild right foraminal stenosis C4-C5 with mild bilateral foraminal stenosis C5-C6.    She has [facility]- she has minimal instability on her flex/ext xrays.   Neck pain may be due  to spondylosis. Do not see any cervical cause of balance issues.   Treatment options discussed with patient and following plan made:   - Continue to recommend cervical facet injections/MBB. Referral to PMR at [facility]. She will call if she does not hear from them.  - We spent a long time discussing her neurological symptoms and concerns. She is interested in getting a second opinion regarding her recent diagnosis of migraine spectrum disorder and not idiopathic intracranial hypertension. Message to Dr. Leigh regarding this. Will let her know when I hear back from him.  - I will send her a message after the first of the year to check on her.  - Follow up scheduled with me in February. May consider changing to Dr. Claudene. She will let me know.   I spent a total of 30 minutes in face-to-face and non-face-to-face activities related to this patient's care today including review of outside records, review of imaging, review of symptoms, physical exam, discussion of differential diagnosis, discussion of treatment options, and documentation.   Glade Boys PA-C Dept. of Neurosurgery  "

## 2024-12-04 ENCOUNTER — Ambulatory Visit: Admitting: Neurosurgery

## 2024-12-18 ENCOUNTER — Ambulatory Visit: Admitting: Orthopedic Surgery
